# Patient Record
Sex: Female | Born: 1955 | Race: Black or African American | Hispanic: No | Marital: Single | Smoking: Current every day smoker
Health system: Southern US, Community
[De-identification: ages and names within clinical notes are randomized; demographics above are authoritative.]

## PROBLEM LIST (undated history)

## (undated) DIAGNOSIS — Z955 Presence of coronary angioplasty implant and graft: Secondary | ICD-10-CM

## (undated) DIAGNOSIS — G8929 Other chronic pain: Secondary | ICD-10-CM

## (undated) DIAGNOSIS — M79603 Pain in arm, unspecified: Secondary | ICD-10-CM

## (undated) DIAGNOSIS — E079 Disorder of thyroid, unspecified: Secondary | ICD-10-CM

## (undated) DIAGNOSIS — I10 Essential (primary) hypertension: Secondary | ICD-10-CM

## (undated) DIAGNOSIS — I219 Acute myocardial infarction, unspecified: Secondary | ICD-10-CM

## (undated) DIAGNOSIS — L8990 Pressure ulcer of unspecified site, unspecified stage: Secondary | ICD-10-CM

## (undated) DIAGNOSIS — G894 Chronic pain syndrome: Secondary | ICD-10-CM

## (undated) DIAGNOSIS — I639 Cerebral infarction, unspecified: Secondary | ICD-10-CM

## (undated) HISTORY — PX: LIPOMA EXCISION: SHX5283

## (undated) HISTORY — PX: BLADDER SUSPENSION: SHX72

---

## 2010-01-29 ENCOUNTER — Ambulatory Visit: Payer: Self-pay | Admitting: Diagnostic Radiology

## 2010-01-29 ENCOUNTER — Emergency Department (HOSPITAL_BASED_OUTPATIENT_CLINIC_OR_DEPARTMENT_OTHER): Admission: EM | Admit: 2010-01-29 | Discharge: 2010-01-29 | Payer: Self-pay | Admitting: Emergency Medicine

## 2010-01-30 ENCOUNTER — Emergency Department (HOSPITAL_BASED_OUTPATIENT_CLINIC_OR_DEPARTMENT_OTHER): Admission: EM | Admit: 2010-01-30 | Discharge: 2010-01-30 | Payer: Self-pay | Admitting: Emergency Medicine

## 2010-06-13 ENCOUNTER — Emergency Department (HOSPITAL_BASED_OUTPATIENT_CLINIC_OR_DEPARTMENT_OTHER)
Admission: EM | Admit: 2010-06-13 | Discharge: 2010-06-14 | Disposition: A | Discharge status: Home / Self Care | Payer: Self-pay | Attending: Emergency Medicine | Admitting: Emergency Medicine

## 2010-06-13 DIAGNOSIS — Z79899 Other long term (current) drug therapy: Secondary | ICD-10-CM | POA: Insufficient documentation

## 2010-06-13 DIAGNOSIS — R197 Diarrhea, unspecified: Secondary | ICD-10-CM | POA: Insufficient documentation

## 2010-06-13 DIAGNOSIS — I2510 Atherosclerotic heart disease of native coronary artery without angina pectoris: Secondary | ICD-10-CM | POA: Insufficient documentation

## 2010-06-13 DIAGNOSIS — E1169 Type 2 diabetes mellitus with other specified complication: Secondary | ICD-10-CM | POA: Insufficient documentation

## 2010-06-13 DIAGNOSIS — R112 Nausea with vomiting, unspecified: Secondary | ICD-10-CM | POA: Insufficient documentation

## 2010-06-13 DIAGNOSIS — I10 Essential (primary) hypertension: Secondary | ICD-10-CM | POA: Insufficient documentation

## 2010-06-13 LAB — URINALYSIS, ROUTINE W REFLEX MICROSCOPIC
Bilirubin Urine: NEGATIVE
Hgb urine dipstick: NEGATIVE
Ketones, ur: NEGATIVE mg/dL
Urine Glucose, Fasting: >1000 mg/dL — AB
pH: 6.5 (ref 5.0–8.0)

## 2010-06-13 LAB — BASIC METABOLIC PANEL
BUN: 8 mg/dL (ref 6–23)
Chloride: 107 mEq/L (ref 96–112)
Creatinine, Ser: 0.5 mg/dL (ref 0.4–1.2)
Glucose, Bld: 242 mg/dL — ABNORMAL HIGH (ref 70–99)
Potassium: 3.9 mEq/L (ref 3.5–5.1)

## 2010-06-13 LAB — URINE MICROSCOPIC-ADD ON

## 2010-06-29 LAB — URINALYSIS, ROUTINE W REFLEX MICROSCOPIC
Bilirubin Urine: NEGATIVE
Leukocytes, UA: NEGATIVE
Nitrite: NEGATIVE
Specific Gravity, Urine: 1.027 (ref 1.005–1.030)
Urobilinogen, UA: 1 mg/dL (ref 0.0–1.0)

## 2010-06-29 LAB — URINE MICROSCOPIC-ADD ON

## 2010-06-29 LAB — GLUCOSE, CAPILLARY: Glucose-Capillary: 181 mg/dL — ABNORMAL HIGH (ref 70–99)

## 2010-12-14 ENCOUNTER — Emergency Department (HOSPITAL_BASED_OUTPATIENT_CLINIC_OR_DEPARTMENT_OTHER)
Admission: EM | Admit: 2010-12-14 | Discharge: 2010-12-14 | Disposition: A | Discharge status: Home / Self Care | Payer: Self-pay | Attending: Emergency Medicine | Admitting: Emergency Medicine

## 2010-12-14 ENCOUNTER — Encounter: Payer: Self-pay | Admitting: *Deleted

## 2010-12-14 ENCOUNTER — Emergency Department (INDEPENDENT_AMBULATORY_CARE_PROVIDER_SITE_OTHER): Payer: Self-pay

## 2010-12-14 DIAGNOSIS — M79609 Pain in unspecified limb: Secondary | ICD-10-CM

## 2010-12-14 DIAGNOSIS — E119 Type 2 diabetes mellitus without complications: Secondary | ICD-10-CM

## 2010-12-14 DIAGNOSIS — J029 Acute pharyngitis, unspecified: Secondary | ICD-10-CM

## 2010-12-14 DIAGNOSIS — F17200 Nicotine dependence, unspecified, uncomplicated: Secondary | ICD-10-CM | POA: Insufficient documentation

## 2010-12-14 DIAGNOSIS — K029 Dental caries, unspecified: Secondary | ICD-10-CM | POA: Insufficient documentation

## 2010-12-14 DIAGNOSIS — I10 Essential (primary) hypertension: Secondary | ICD-10-CM | POA: Insufficient documentation

## 2010-12-14 DIAGNOSIS — Z794 Long term (current) use of insulin: Secondary | ICD-10-CM

## 2010-12-14 DIAGNOSIS — E049 Nontoxic goiter, unspecified: Secondary | ICD-10-CM | POA: Insufficient documentation

## 2010-12-14 DIAGNOSIS — E010 Iodine-deficiency related diffuse (endemic) goiter: Secondary | ICD-10-CM

## 2010-12-14 HISTORY — DX: Disorder of thyroid, unspecified: E07.9

## 2010-12-14 HISTORY — DX: Presence of coronary angioplasty implant and graft: Z95.5

## 2010-12-14 HISTORY — DX: Essential (primary) hypertension: I10

## 2010-12-14 MED ORDER — AZITHROMYCIN 250 MG PO TABS
500.0000 mg | ORAL_TABLET | Freq: Once | ORAL | Status: AC
  Administered 2010-12-14: 500 mg via ORAL
  Filled 2010-12-14: qty 2

## 2010-12-14 MED ORDER — PREDNISONE 20 MG PO TABS: 20.0000 mg | ORAL_TABLET | Freq: Every day | ORAL | Status: AC

## 2010-12-14 MED ORDER — PREDNISONE 20 MG PO TABS
20.0000 mg | ORAL_TABLET | Freq: Once | ORAL | Status: AC
  Administered 2010-12-14: 20 mg via ORAL
  Filled 2010-12-14: qty 1

## 2010-12-14 MED ORDER — AZITHROMYCIN 250 MG PO TABS: ORAL_TABLET | ORAL | Status: AC

## 2010-12-14 NOTE — ED Notes (Signed)
Onset Sunday night worsening sore throat, fever chills generalized body aches states having to sit up to sleep because she feels like her throat is closing when she lies down. Denies nausea vomiting

## 2010-12-14 NOTE — Discharge Instructions (Signed)
 Be sure to keep your appointment with your specialist next week.  You may wish to make another appointment with your regular doctor as well for later this week to recheck your arm.  Your ultrasound does not show any abscess or infection in your arm.  Take medications as prescribed.

## 2010-12-14 NOTE — ED Provider Notes (Signed)
History     CSN: [id number] Arrival date & time: 12/14/2010  8:40 AM  Chief Complaint  Patient presents with  . Sore Throat  . Adenopathy  . Generalized Body Aches  . Chills   HPI Comments: Patient reports that she's had some cold symptoms including a runny nose and a mild dry cough. She denies any dental pain. She asked of her teeth appear to be infected. She reports that she developed a "knot" located in her right triceps area associated with some weakness of her right hand. This occurred several months ago. Patient reports that she has been injecting her insulin into her upper arm in that area after her physician told her to change locations as far as where she administers her insulin. Chills reports that the last few nights has been difficult with breathing when she is laying flat and her head is turned from one side to the other. She reports no problems when she is sitting up and awake. She has a history of thyroid enlargement and used to be on Synthroid but was taken off of it 6 months ago. She reports her physician is aware that her thyroid was enlarged and chest appointment with a specialist next week on Thursday. She denies any chest pain, shortness of breath, abdominal pain, nausea, vomiting, diarrhea. She denies any fevers or chills that she is aware of. She does have a normal appetite and no significant weight loss and she is aware of. She denies any slurred speech, facial droop, numbness or weakness of her right leg or any of her limbs on the left side. She reports the area under her right tricep area is not swollen or painful. She reports she had a mammogram about 2 months ago and was told it was normal. She asks if she has Chad Nile disease.  Patient is a 55 y.o. female presenting with pharyngitis.  Sore Throat    Past Medical History  Diagnosis Date  . Hypertension   . Diabetes mellitus   . History of right coronary artery stent placement     2 stents placed at separate times    . Thyroid disease     History reviewed. No pertinent past surgical history.  History reviewed. No pertinent family history.  History  Substance Use Topics  . Smoking status: Current Everyday Smoker -- 0.5 packs/day  . Smokeless tobacco: Never Used  . Alcohol Use: No    OB History    Grav Para Term Preterm Abortions TAB SAB Ect Mult Living                  Review of Systems  All other systems reviewed and are negative.    Physical Exam  BP 132/66  Pulse 90  Temp(Src) 98.6 F (37 C) (Oral)  Resp 20  SpO2 99%  Physical Exam  Constitutional: She is oriented to person, place, and time. She appears well-developed and well-nourished.  HENT:  Head: Normocephalic.    Mouth/Throat: Oropharynx is clear and moist and mucous membranes are normal. Abnormal dentition. Dental caries present. No oropharyngeal exudate, posterior oropharyngeal edema, posterior oropharyngeal erythema or tonsillar abscesses.  Cardiovascular: Normal rate, regular rhythm and normal heart sounds.   Pulmonary/Chest: Effort normal and breath sounds normal. She has no wheezes.  Abdominal: Soft.  Musculoskeletal:       Arms: Neurological: She is alert and oriented to person, place, and time. She has normal strength.  Skin: Skin is warm and dry.  Psychiatric: Her mood appears  anxious.    ED Course  Procedures  MDM No abscess see on U/S.  No airway compromise on exam, no stridor, wheezing, no distress, RA sats are normal here on exam.  Pt is reassured. She has outpt follow up already arranged.  Will put her on prednisone low dose for swelling from pharyngitis and also z pak.        Gavin Pound. Ghim, MD 12/14/10 1043

## 2011-03-04 ENCOUNTER — Encounter (HOSPITAL_BASED_OUTPATIENT_CLINIC_OR_DEPARTMENT_OTHER): Payer: Self-pay | Admitting: *Deleted

## 2011-03-04 ENCOUNTER — Emergency Department (HOSPITAL_BASED_OUTPATIENT_CLINIC_OR_DEPARTMENT_OTHER)
Admission: EM | Admit: 2011-03-04 | Discharge: 2011-03-04 | Disposition: A | Discharge status: Home / Self Care | Payer: Self-pay | Attending: Emergency Medicine | Admitting: Emergency Medicine

## 2011-03-04 DIAGNOSIS — Z79899 Other long term (current) drug therapy: Secondary | ICD-10-CM | POA: Insufficient documentation

## 2011-03-04 DIAGNOSIS — I10 Essential (primary) hypertension: Secondary | ICD-10-CM | POA: Insufficient documentation

## 2011-03-04 DIAGNOSIS — F17200 Nicotine dependence, unspecified, uncomplicated: Secondary | ICD-10-CM | POA: Insufficient documentation

## 2011-03-04 DIAGNOSIS — E1169 Type 2 diabetes mellitus with other specified complication: Secondary | ICD-10-CM | POA: Insufficient documentation

## 2011-03-04 DIAGNOSIS — R739 Hyperglycemia, unspecified: Secondary | ICD-10-CM

## 2011-03-04 DIAGNOSIS — B3731 Acute candidiasis of vulva and vagina: Secondary | ICD-10-CM | POA: Insufficient documentation

## 2011-03-04 DIAGNOSIS — E079 Disorder of thyroid, unspecified: Secondary | ICD-10-CM | POA: Insufficient documentation

## 2011-03-04 DIAGNOSIS — B373 Candidiasis of vulva and vagina: Secondary | ICD-10-CM | POA: Insufficient documentation

## 2011-03-04 LAB — GLUCOSE, CAPILLARY
Glucose-Capillary: 128 mg/dL — ABNORMAL HIGH (ref 70–99)
Glucose-Capillary: 170 mg/dL — ABNORMAL HIGH (ref 70–99)

## 2011-03-04 LAB — URINALYSIS, ROUTINE W REFLEX MICROSCOPIC
Ketones, ur: NEGATIVE mg/dL
Leukocytes, UA: NEGATIVE
Nitrite: NEGATIVE
Protein, ur: NEGATIVE mg/dL
pH: 5.5 (ref 5.0–8.0)

## 2011-03-04 LAB — BASIC METABOLIC PANEL
Calcium: 9.6 mg/dL (ref 8.4–10.5)
Chloride: 102 mEq/L (ref 96–112)
Creatinine, Ser: 0.4 mg/dL — ABNORMAL LOW (ref 0.50–1.10)
GFR calc Af Amer: >90 mL/min (ref >90)

## 2011-03-04 MED ORDER — INSULIN REGULAR HUMAN 100 UNIT/ML IJ SOLN
INTRAMUSCULAR | Status: AC
  Filled 2011-03-04: qty 3

## 2011-03-04 MED ORDER — INSULIN ASPART 100 UNIT/ML ~~LOC~~ SOLN
10.0000 [IU] | Freq: Once | SUBCUTANEOUS | Status: AC
  Administered 2011-03-04: 10 [IU] via INTRAVENOUS
  Filled 2011-03-04: qty 3

## 2011-03-04 MED ORDER — CLOTRIMAZOLE 1 % EX CREA: TOPICAL_CREAM | CUTANEOUS | Status: DC

## 2011-03-04 MED ORDER — FLUCONAZOLE 200 MG PO TABS: 200.0000 mg | ORAL_TABLET | Freq: Every day | ORAL | Status: AC

## 2011-03-04 MED ORDER — SODIUM CHLORIDE 0.9 % IV BOLUS (SEPSIS)
1000.0000 mL | Freq: Once | INTRAVENOUS | Status: AC
  Administered 2011-03-04: 1000 mL via INTRAVENOUS

## 2011-03-04 NOTE — ED Notes (Signed)
Pt states she has a hx of yeast infections and has been "raw down there" for about a week. "Causing my sugar to go up"

## 2011-03-04 NOTE — ED Provider Notes (Signed)
History     CSN: [id number] Arrival date & time: 03/04/2011  2:23 PM   First MD Initiated Contact with Patient 03/04/11 1506      Chief Complaint  Patient presents with  . Vaginitis    (Consider location/radiation/quality/duration/timing/severity/associated sxs/prior treatment) HPI  Past Medical History  Diagnosis Date  . Hypertension   . Diabetes mellitus   . History of right coronary artery stent placement     2 stents placed at separate times  . Thyroid disease     Past Surgical History  Procedure Date  . Abdominal hysterectomy     History reviewed. No pertinent family history.  History  Substance Use Topics  . Smoking status: Current Everyday Smoker -- 0.5 packs/day  . Smokeless tobacco: Never Used  . Alcohol Use: No    OB History    Grav Para Term Preterm Abortions TAB SAB Ect Mult Living                  Review of Systems  Allergies  Amoxicillin; Morphine and related; and Percodan  Home Medications   Current Outpatient Rx  Name Route Sig Dispense Refill  . LEVOTHYROXINE SODIUM 125 MCG PO TABS Oral Take 125 mcg by mouth daily.      Marland Kitchen METOPROLOL SUCCINATE 25 MG PO TB24 Oral Take 25 mg by mouth daily.      . ASPIRIN 81 MG PO CHEW Oral Chew 81 mg by mouth daily.      . ATORVASTATIN CALCIUM 10 MG PO TABS Oral Take 10 mg by mouth daily.      Marland Kitchen CLOTRIMAZOLE 1 % EX CREA  Apply to affected area 2 times daily 15 g 0  . FLUCONAZOLE 200 MG PO TABS Oral Take 1 tablet (200 mg total) by mouth daily. 7 tablet 0  . GLIPIZIDE 10 MG PO TABS Oral Take 10 mg by mouth 2 (two) times daily before a meal.      . INSULIN GLARGINE 100 UNIT/ML [place] SOLN Subcutaneous Inject into the skin at bedtime.        BP 140/53  Pulse 88  Temp(Src) 98.4 F (36.9 C) (Oral)  Resp 20  Ht 4\' 11"  (1.499 m)  Wt 135 lb (61.236 kg)  BMI 27.27 kg/m2  SpO2 100%  Physical Exam  ED Course  Procedures (including critical care time)  Labs Reviewed  GLUCOSE, CAPILLARY - Abnormal; Notable  for the following:    Glucose-Capillary 494 (*)    All other components within normal limits  BASIC METABOLIC PANEL - Abnormal; Notable for the following:    Glucose, Bld 385 (*)    Creatinine, Ser 0.40 (*)    All other components within normal limits  URINALYSIS, ROUTINE W REFLEX MICROSCOPIC - Abnormal; Notable for the following:    Specific Gravity, Urine 1.043 (*)    Glucose, UA >1000 (*)    All other components within normal limits  URINE MICROSCOPIC-ADD ON - Abnormal; Notable for the following:    Bacteria, UA FEW (*)    All other components within normal limits  GLUCOSE, CAPILLARY - Abnormal; Notable for the following:    Glucose-Capillary 194 (*)    All other components within normal limits  GLUCOSE, CAPILLARY - Abnormal; Notable for the following:    Glucose-Capillary 128 (*)    All other components within normal limits  GLUCOSE, CAPILLARY - Abnormal; Notable for the following:    Glucose-Capillary 170 (*)    All other components within normal limits  No results found.   1. Hyperglycemia   2. Vaginal yeast infection       MDM  Patient feeling better, CBG much improved. Patient has eaten and will follow up with her doctor.   Medical screening examination/treatment/procedure(s) were performed by non-physician practitioner and as supervising physician I was immediately available for consultation/collaboration. Osvaldo Human, M.D.     Rodena Medin, PA 03/04/11 2013  Carleene Cooper III, MD 03/05/11 1225

## 2011-11-13 ENCOUNTER — Emergency Department (HOSPITAL_BASED_OUTPATIENT_CLINIC_OR_DEPARTMENT_OTHER): Payer: Self-pay

## 2011-11-13 ENCOUNTER — Encounter (HOSPITAL_BASED_OUTPATIENT_CLINIC_OR_DEPARTMENT_OTHER): Payer: Self-pay | Admitting: Emergency Medicine

## 2011-11-13 ENCOUNTER — Emergency Department (HOSPITAL_BASED_OUTPATIENT_CLINIC_OR_DEPARTMENT_OTHER)
Admission: EM | Admit: 2011-11-13 | Discharge: 2011-11-13 | Disposition: A | Discharge status: Home / Self Care | Payer: Self-pay | Attending: Emergency Medicine | Admitting: Emergency Medicine

## 2011-11-13 DIAGNOSIS — E079 Disorder of thyroid, unspecified: Secondary | ICD-10-CM | POA: Insufficient documentation

## 2011-11-13 DIAGNOSIS — M549 Dorsalgia, unspecified: Secondary | ICD-10-CM | POA: Insufficient documentation

## 2011-11-13 DIAGNOSIS — Z9861 Coronary angioplasty status: Secondary | ICD-10-CM | POA: Insufficient documentation

## 2011-11-13 DIAGNOSIS — F17200 Nicotine dependence, unspecified, uncomplicated: Secondary | ICD-10-CM | POA: Insufficient documentation

## 2011-11-13 DIAGNOSIS — I10 Essential (primary) hypertension: Secondary | ICD-10-CM | POA: Insufficient documentation

## 2011-11-13 DIAGNOSIS — Z794 Long term (current) use of insulin: Secondary | ICD-10-CM | POA: Insufficient documentation

## 2011-11-13 DIAGNOSIS — Z79899 Other long term (current) drug therapy: Secondary | ICD-10-CM | POA: Insufficient documentation

## 2011-11-13 DIAGNOSIS — E119 Type 2 diabetes mellitus without complications: Secondary | ICD-10-CM | POA: Insufficient documentation

## 2011-11-13 LAB — URINALYSIS, ROUTINE W REFLEX MICROSCOPIC
Bilirubin Urine: NEGATIVE
Glucose, UA: >1000 mg/dL — AB
Hgb urine dipstick: NEGATIVE
Specific Gravity, Urine: 1.013 (ref 1.005–1.030)
Urobilinogen, UA: 0.2 mg/dL (ref 0.0–1.0)

## 2011-11-13 LAB — URINE MICROSCOPIC-ADD ON

## 2011-11-13 MED ORDER — DOCUSATE SODIUM 100 MG PO CAPS: 100.0000 mg | ORAL_CAPSULE | Freq: Two times a day (BID) | ORAL | Status: AC

## 2011-11-13 MED ORDER — TRAMADOL HCL 50 MG PO TABS: 50.0000 mg | ORAL_TABLET | Freq: Four times a day (QID) | ORAL | Status: AC | PRN

## 2011-11-13 MED ORDER — TRAMADOL HCL 50 MG PO TABS
50.0000 mg | ORAL_TABLET | Freq: Once | ORAL | Status: AC
  Administered 2011-11-13: 50 mg via ORAL

## 2011-11-13 MED ORDER — TRAMADOL HCL 50 MG PO TABS
ORAL_TABLET | ORAL | Status: AC
  Filled 2011-11-13: qty 1

## 2011-11-13 MED ORDER — METRONIDAZOLE 1 % EX GEL: Freq: Every day | CUTANEOUS | Status: AC

## 2011-11-13 NOTE — ED Provider Notes (Signed)
History     CSN: [id number]  Arrival date & time 11/13/11  1478   First MD Initiated Contact with Patient 11/13/11 2045      Chief Complaint  Patient presents with  . Back Pain    (Consider location/radiation/quality/duration/timing/severity/associated sxs/prior treatment) Patient is a 56 y.o. female presenting with back pain. The history is provided by the patient. No language interpreter was used.  Back Pain  This is a new problem. The problem occurs constantly. The problem has not changed since onset.The pain is associated with no known injury. The pain is present in the thoracic spine. The quality of the pain is described as aching. The pain is moderate. The symptoms are aggravated by bending. Pertinent negatives include no chest pain and no abdominal pain. She has tried nothing for the symptoms. The treatment provided no relief.    Past Medical History  Diagnosis Date  . Hypertension   . Diabetes mellitus   . History of right coronary artery stent placement     2 stents placed at separate times  . Thyroid disease     Past Surgical History  Procedure Date  . Abdominal hysterectomy     No family history on file.  History  Substance Use Topics  . Smoking status: Current Everyday Smoker -- 0.5 packs/day  . Smokeless tobacco: Never Used  . Alcohol Use: No    OB History    Grav Para Term Preterm Abortions TAB SAB Ect Mult Living                  Review of Systems  Cardiovascular: Negative for chest pain.  Gastrointestinal: Negative for abdominal pain.  Musculoskeletal: Positive for back pain.  All other systems reviewed and are negative.    Allergies  Amoxicillin; Morphine and related; and Percodan  Home Medications   Current Outpatient Rx  Name Route Sig Dispense Refill  . ASPIRIN 81 MG PO CHEW Oral Chew 81 mg by mouth daily.      . ATORVASTATIN CALCIUM 10 MG PO TABS Oral Take 10 mg by mouth daily.      Marland Kitchen CLOTRIMAZOLE 1 % EX CREA  Apply to affected  area 2 times daily 15 g 0  . GLIPIZIDE 10 MG PO TABS Oral Take 10 mg by mouth 2 (two) times daily before a meal.      . INSULIN GLARGINE 100 UNIT/ML [place] SOLN Subcutaneous Inject into the skin at bedtime.      Marland Kitchen LEVOTHYROXINE SODIUM 125 MCG PO TABS Oral Take 125 mcg by mouth daily.      Marland Kitchen METOPROLOL SUCCINATE ER 25 MG PO TB24 Oral Take 25 mg by mouth daily.        BP 168/65  Pulse 64  Temp 99 F (37.2 C) (Oral)  Resp 16  Ht 4\' 11"  (1.499 m)  Wt 140 lb (63.504 kg)  BMI 28.28 kg/m2  SpO2 99%  Physical Exam  Nursing note and vitals reviewed. Constitutional: She is oriented to person, place, and time. She appears well-developed and well-nourished.  HENT:  Head: Normocephalic and atraumatic.  Right Ear: External ear normal.  Left Ear: External ear normal.  Eyes: Conjunctivae and EOM are normal. Pupils are equal, round, and reactive to light.  Neck: Normal range of motion. Neck supple.  Cardiovascular: Normal rate and regular rhythm.   Pulmonary/Chest: Effort normal and breath sounds normal.  Abdominal: Soft. Bowel sounds are normal.  Musculoskeletal: She exhibits tenderness.       Tender  thoracic spine diffusely  Neurological: She is alert and oriented to person, place, and time. She has normal reflexes.  Skin: Skin is warm.  Psychiatric: She has a normal mood and affect.    ED Course  Procedures (including critical care time)  Labs Reviewed  URINALYSIS, ROUTINE W REFLEX MICROSCOPIC - Abnormal; Notable for the following:    Glucose, UA >1000 (*)     All other components within normal limits  URINE MICROSCOPIC-ADD ON   No results found.   1. Back pain       MDM  Pt has a list of multiple diseases she is concerned about.  Pt is scheduled for a breast biopsy for a calcification seen on mammogram        Lonia Skinner Pierre Part, [place] 11/13/11 2321

## 2011-11-13 NOTE — ED Notes (Addendum)
Pt was seen 5 months ago for back pain at [facility] and sts was told she was constipated.  Pain continues from mid back to upper back. Pt has researched on the internet all the possible causes of her pain and she is concerned.

## 2011-11-13 NOTE — ED Notes (Signed)
Pt states was seen at [facility] and had a cat scan performed "and they told me i was constipated". Pt has been taking suppositories with minimal relief. States pain does not worsen with movement or certain positions. Pt has a list of various physical conditions that she is concerned she may have.

## 2011-11-14 NOTE — ED Provider Notes (Signed)
Medical screening examination/treatment/procedure(s) were performed by non-physician practitioner and as supervising physician I was immediately available for consultation/collaboration.   Wataru Mccowen, MD 11/14/11 0011 

## 2012-04-22 ENCOUNTER — Encounter (HOSPITAL_BASED_OUTPATIENT_CLINIC_OR_DEPARTMENT_OTHER): Payer: Self-pay | Admitting: *Deleted

## 2012-04-22 ENCOUNTER — Emergency Department (HOSPITAL_BASED_OUTPATIENT_CLINIC_OR_DEPARTMENT_OTHER): Payer: Self-pay

## 2012-04-22 ENCOUNTER — Emergency Department (HOSPITAL_BASED_OUTPATIENT_CLINIC_OR_DEPARTMENT_OTHER)
Admission: EM | Admit: 2012-04-22 | Discharge: 2012-04-22 | Disposition: A | Discharge status: Home / Self Care | Payer: Self-pay | Attending: Emergency Medicine | Admitting: Emergency Medicine

## 2012-04-22 DIAGNOSIS — R509 Fever, unspecified: Secondary | ICD-10-CM | POA: Insufficient documentation

## 2012-04-22 DIAGNOSIS — R071 Chest pain on breathing: Secondary | ICD-10-CM | POA: Insufficient documentation

## 2012-04-22 DIAGNOSIS — E079 Disorder of thyroid, unspecified: Secondary | ICD-10-CM | POA: Insufficient documentation

## 2012-04-22 DIAGNOSIS — R059 Cough, unspecified: Secondary | ICD-10-CM | POA: Insufficient documentation

## 2012-04-22 DIAGNOSIS — Z79899 Other long term (current) drug therapy: Secondary | ICD-10-CM | POA: Insufficient documentation

## 2012-04-22 DIAGNOSIS — R05 Cough: Secondary | ICD-10-CM | POA: Insufficient documentation

## 2012-04-22 DIAGNOSIS — E119 Type 2 diabetes mellitus without complications: Secondary | ICD-10-CM | POA: Insufficient documentation

## 2012-04-22 DIAGNOSIS — J3489 Other specified disorders of nose and nasal sinuses: Secondary | ICD-10-CM | POA: Insufficient documentation

## 2012-04-22 DIAGNOSIS — IMO0001 Reserved for inherently not codable concepts without codable children: Secondary | ICD-10-CM | POA: Insufficient documentation

## 2012-04-22 DIAGNOSIS — Z95818 Presence of other cardiac implants and grafts: Secondary | ICD-10-CM | POA: Insufficient documentation

## 2012-04-22 DIAGNOSIS — R5381 Other malaise: Secondary | ICD-10-CM | POA: Insufficient documentation

## 2012-04-22 DIAGNOSIS — R197 Diarrhea, unspecified: Secondary | ICD-10-CM | POA: Insufficient documentation

## 2012-04-22 DIAGNOSIS — R630 Anorexia: Secondary | ICD-10-CM | POA: Insufficient documentation

## 2012-04-22 DIAGNOSIS — Z794 Long term (current) use of insulin: Secondary | ICD-10-CM | POA: Insufficient documentation

## 2012-04-22 DIAGNOSIS — F17200 Nicotine dependence, unspecified, uncomplicated: Secondary | ICD-10-CM | POA: Insufficient documentation

## 2012-04-22 DIAGNOSIS — J111 Influenza due to unidentified influenza virus with other respiratory manifestations: Secondary | ICD-10-CM

## 2012-04-22 DIAGNOSIS — R51 Headache: Secondary | ICD-10-CM | POA: Insufficient documentation

## 2012-04-22 DIAGNOSIS — R5383 Other fatigue: Secondary | ICD-10-CM | POA: Insufficient documentation

## 2012-04-22 DIAGNOSIS — I10 Essential (primary) hypertension: Secondary | ICD-10-CM | POA: Insufficient documentation

## 2012-04-22 DIAGNOSIS — Z7982 Long term (current) use of aspirin: Secondary | ICD-10-CM | POA: Insufficient documentation

## 2012-04-22 MED ORDER — KETOROLAC TROMETHAMINE 30 MG/ML IJ SOLN
30.0000 mg | Freq: Once | INTRAMUSCULAR | Status: AC
  Administered 2012-04-22: 30 mg via INTRAMUSCULAR
  Filled 2012-04-22: qty 1

## 2012-04-22 MED ORDER — FLUTICASONE PROPIONATE 50 MCG/ACT NA SUSP: 2.0000 | Freq: Every day | NASAL | Status: DC

## 2012-04-22 MED ORDER — KETOROLAC TROMETHAMINE 10 MG PO TABS: 10.0000 mg | ORAL_TABLET | Freq: Four times a day (QID) | ORAL | Status: DC | PRN

## 2012-04-22 MED ORDER — ALBUTEROL SULFATE HFA 108 (90 BASE) MCG/ACT IN AERS: 2.0000 | INHALATION_SPRAY | RESPIRATORY_TRACT | Status: DC | PRN

## 2012-04-22 MED ORDER — AEROCHAMBER PLUS W/MASK MISC: Status: AC

## 2012-04-22 NOTE — ED Provider Notes (Signed)
History   This chart was scribed for Laura Horn, MD by Leone Payor, ED Scribe. This patient was seen in room MHOTF/OTF and the patient's care was started at 1938.    CSN: [id number]  Arrival date & time 04/22/12  1811   First MD Initiated Contact with Patient 04/22/12 1938      Chief Complaint  Patient presents with  . Cough     The history is provided by the patient. No language interpreter was used.    Laura Oconnor is a 57 y.o. female who presents to the Emergency Department complaining of cough with associated diarrhea, fever, chills, fatigue, weakness, congestion, sinus pressure, body aches, headaches starting 2 days ago. Pt has decreased appetite but denies forcing herself to consume food. She denies vomiting, SOB, rash. Pt does not remember having an inhaler before but states she has had a breathing treatment in the past.    Pt has h/o HTN, DM.  Pt is a current everyday smoker but denies alcohol use.  Past Medical History  Diagnosis Date  . Hypertension   . Diabetes mellitus   . History of right coronary artery stent placement     2 stents placed at separate times  . Thyroid disease     Past Surgical History  Procedure Date  . Abdominal hysterectomy     No family history on file.  History  Substance Use Topics  . Smoking status: Current Every Day Smoker -- 0.5 packs/day    Types: Cigarettes  . Smokeless tobacco: Never Used  . Alcohol Use: No    No OB history provided.   Review of Systems 10 Systems reviewed and all are negative for acute change except as noted in the HPI.    Allergies  Amoxicillin; Morphine and related; Percocet; and Percodan  Home Medications   Current Outpatient Rx  Name  Route  Sig  Dispense  Refill  . ALBUTEROL SULFATE HFA 108 (90 BASE) MCG/ACT IN AERS   Inhalation   Inhale 2 puffs into the lungs every 2 (two) hours as needed for wheezing or shortness of breath (cough).   1 Inhaler   0   . ASPIRIN 81 MG PO CHEW   Oral  Chew 81 mg by mouth daily.           . ATORVASTATIN CALCIUM 10 MG PO TABS   Oral   Take 10 mg by mouth daily.           Marland Kitchen FLUTICASONE PROPIONATE 50 MCG/ACT NA SUSP   Nasal   Place 2 sprays into the nose daily.   16 g   0   . GLIPIZIDE 10 MG PO TABS   Oral   Take 10 mg by mouth 2 (two) times daily before a meal.           . INSULIN GLARGINE 100 UNIT/ML [place] SOLN   Subcutaneous   Inject 60 Units into the skin at bedtime.          Marland Kitchen KETOROLAC TROMETHAMINE 10 MG PO TABS   Oral   Take 1 tablet (10 mg total) by mouth every 6 (six) hours as needed for pain.   20 tablet   0   . LEVOTHYROXINE SODIUM 125 MCG PO TABS   Oral   Take 125 mcg by mouth daily.           Marland Kitchen METOPROLOL SUCCINATE ER 25 MG PO TB24   Oral   Take 25 mg by mouth  daily. Patient took this medication yesterday between 12:30 pm- 1:00 pm.         . METRONIDAZOLE 1 % EX GEL   Topical   Apply topically daily.   45 g   0   . AEROCHAMBER PLUS W/MASK MISC      Use as instructed   1 each   2   . TRAMADOL HCL 50 MG PO TABS   Oral   Take 50 mg by mouth every 6 (six) hours as needed. For pain.           BP 161/58  Pulse 107  Temp 98.7 F (37.1 C) (Oral)  Resp 18  SpO2 97%  Physical Exam  Nursing note and vitals reviewed. Constitutional:       Awake, alert, nontoxic appearance.  HENT:  Head: Atraumatic.  Right Ear: External ear normal.  Left Ear: External ear normal.  Mouth/Throat: Oropharynx is clear and moist.  Eyes: Right eye exhibits no discharge. Left eye exhibits no discharge.  Neck: Neck supple.  Cardiovascular: Normal rate, regular rhythm and normal heart sounds.   Pulmonary/Chest: Effort normal and breath sounds normal. She has no wheezes.       Chest wall tender.   Abdominal: Soft. There is no tenderness. There is no rebound.  Musculoskeletal: She exhibits no tenderness.       Baseline ROM, no obvious new focal weakness. Back and all 4 extremities are diffusely tender.     Neurological:       Mental status and motor strength appears baseline for patient and situation.  Skin: No rash noted.  Psychiatric: She has a normal mood and affect.    ED Course  Procedures (including critical care time)  DIAGNOSTIC STUDIES: Oxygen Saturation is 97% on room air, adequate by my interpretation.    COORDINATION OF CARE:   7:49PM Patient / Family / Caregiver informed of clinical course, understand medical decision-making process, and agree with plan. Pt requests Toradol for pain. She states it is the only medication that works for her.   Labs Reviewed - No data to display Dg Chest 2 View  04/22/2012  *RADIOLOGY REPORT*  Clinical Data: Cough.  Chills and fever  CHEST - 2 VIEW  Comparison: 11/13/2011  Findings: Normal heart size.  No pleural effusion or edema.  No airspace consolidation identified.  The visualized osseous structures are unremarkable.  IMPRESSION:  1.  No active cardiopulmonary abnormalities.   Original Report Authenticated By: Signa Kell, M.D.      1. Influenza       MDM  I personally performed the services described in this documentation, which was scribed in my presence. The recorded information has been reviewed and is accurate.  Patient / Family / Caregiver informed of clinical course, understand medical decision-making process, and agree with plan.  I doubt any other EMC precluding discharge at this time including, but not necessarily limited to the following: SBI.  Laura Horn, MD 04/23/12 [phone]

## 2012-04-22 NOTE — ED Notes (Signed)
Pt. Reports her blood sugar today was 139 and she took her lantus today at 1720 60 units and she took her glucotrol this morning at 7am

## 2012-04-22 NOTE — ED Notes (Signed)
Cough, chills, fever, aching all over, headache x 2 days.

## 2012-06-08 ENCOUNTER — Emergency Department (HOSPITAL_BASED_OUTPATIENT_CLINIC_OR_DEPARTMENT_OTHER)
Admission: EM | Admit: 2012-06-08 | Discharge: 2012-06-08 | Disposition: A | Discharge status: Home / Self Care | Payer: Self-pay | Attending: Emergency Medicine | Admitting: Emergency Medicine

## 2012-06-08 ENCOUNTER — Encounter (HOSPITAL_BASED_OUTPATIENT_CLINIC_OR_DEPARTMENT_OTHER): Payer: Self-pay | Admitting: *Deleted

## 2012-06-08 DIAGNOSIS — E119 Type 2 diabetes mellitus without complications: Secondary | ICD-10-CM | POA: Insufficient documentation

## 2012-06-08 DIAGNOSIS — E079 Disorder of thyroid, unspecified: Secondary | ICD-10-CM | POA: Insufficient documentation

## 2012-06-08 DIAGNOSIS — IMO0001 Reserved for inherently not codable concepts without codable children: Secondary | ICD-10-CM | POA: Insufficient documentation

## 2012-06-08 DIAGNOSIS — I10 Essential (primary) hypertension: Secondary | ICD-10-CM | POA: Insufficient documentation

## 2012-06-08 DIAGNOSIS — Z794 Long term (current) use of insulin: Secondary | ICD-10-CM | POA: Insufficient documentation

## 2012-06-08 DIAGNOSIS — M2550 Pain in unspecified joint: Secondary | ICD-10-CM | POA: Insufficient documentation

## 2012-06-08 DIAGNOSIS — K5900 Constipation, unspecified: Secondary | ICD-10-CM | POA: Insufficient documentation

## 2012-06-08 DIAGNOSIS — E1142 Type 2 diabetes mellitus with diabetic polyneuropathy: Secondary | ICD-10-CM | POA: Insufficient documentation

## 2012-06-08 DIAGNOSIS — IMO0002 Reserved for concepts with insufficient information to code with codable children: Secondary | ICD-10-CM | POA: Insufficient documentation

## 2012-06-08 DIAGNOSIS — F17200 Nicotine dependence, unspecified, uncomplicated: Secondary | ICD-10-CM | POA: Insufficient documentation

## 2012-06-08 DIAGNOSIS — Z9861 Coronary angioplasty status: Secondary | ICD-10-CM | POA: Insufficient documentation

## 2012-06-08 DIAGNOSIS — Z7982 Long term (current) use of aspirin: Secondary | ICD-10-CM | POA: Insufficient documentation

## 2012-06-08 DIAGNOSIS — E1149 Type 2 diabetes mellitus with other diabetic neurological complication: Secondary | ICD-10-CM | POA: Insufficient documentation

## 2012-06-08 DIAGNOSIS — Z79899 Other long term (current) drug therapy: Secondary | ICD-10-CM | POA: Insufficient documentation

## 2012-06-08 LAB — GLUCOSE, CAPILLARY

## 2012-06-08 MED ORDER — MAGNESIUM CITRATE PO SOLN
1.0000 | Freq: Once | ORAL | Status: AC
  Administered 2012-06-08: 1 via ORAL
  Filled 2012-06-08: qty 296

## 2012-06-08 MED ORDER — PREGABALIN 100 MG PO CAPS
200.0000 mg | ORAL_CAPSULE | Freq: Once | ORAL | Status: DC
  Filled 2012-06-08: qty 2

## 2012-06-08 MED ORDER — GABAPENTIN 600 MG PO TABS
ORAL_TABLET | ORAL | Status: AC
  Administered 2012-06-08: 600 mg via ORAL
  Filled 2012-06-08: qty 1

## 2012-06-08 MED ORDER — KETOROLAC TROMETHAMINE 60 MG/2ML IM SOLN
60.0000 mg | Freq: Once | INTRAMUSCULAR | Status: AC
  Administered 2012-06-08: 60 mg via INTRAMUSCULAR
  Filled 2012-06-08: qty 2

## 2012-06-08 MED ORDER — PREGABALIN 300 MG PO CAPS: 300.0000 mg | ORAL_CAPSULE | Freq: Every day | ORAL | Status: DC

## 2012-06-08 MED ORDER — GABAPENTIN 600 MG PO TABS
600.0000 mg | ORAL_TABLET | Freq: Once | ORAL | Status: AC
  Administered 2012-06-08: 600 mg via ORAL

## 2012-06-08 NOTE — ED Notes (Signed)
Left leg pain onset Tuesday. No known injury. Unable to sleep.

## 2012-06-08 NOTE — ED Provider Notes (Signed)
History     CSN: [id number]  Arrival date & time 06/08/12  1322   First MD Initiated Contact with Patient 06/08/12 1330      Chief Complaint  Patient presents with  . Leg Pain    (Consider location/radiation/quality/duration/timing/severity/associated sxs/prior treatment) HPI Comments: Patient is a 57 year old female with a past medical history of hypertension and diabetes who presents with left leg pain for the past 5 days. Pain started gradually and progressively worsened since the onset. The pain is burning and severe. Patient has not tried anything for symptoms. No aggravating/alleviating factors. Patient also complains of constipation. She has tried an enema which has provided no relief.    Past Medical History  Diagnosis Date  . Hypertension   . Diabetes mellitus   . History of right coronary artery stent placement     2 stents placed at separate times  . Thyroid disease     Past Surgical History  Procedure Laterality Date  . Abdominal hysterectomy      History reviewed. No pertinent family history.  History  Substance Use Topics  . Smoking status: Current Every Day Smoker -- 0.50 packs/day    Types: Cigarettes  . Smokeless tobacco: Never Used  . Alcohol Use: No    OB History   Grav Para Term Preterm Abortions TAB SAB Ect Mult Living                  Review of Systems  Gastrointestinal: Positive for constipation.  Musculoskeletal: Positive for myalgias and arthralgias.  All other systems reviewed and are negative.    Allergies  Amoxicillin; Morphine and related; Percocet; and Percodan  Home Medications   Current Outpatient Rx  Name  Route  Sig  Dispense  Refill  . albuterol (PROVENTIL HFA;VENTOLIN HFA) 108 (90 BASE) MCG/ACT inhaler   Inhalation   Inhale 2 puffs into the lungs every 2 (two) hours as needed for wheezing or shortness of breath (cough).   1 Inhaler   0   . aspirin 81 MG chewable tablet   Oral   Chew 81 mg by mouth daily.           Marland Kitchen atorvastatin (LIPITOR) 10 MG tablet   Oral   Take 10 mg by mouth daily.           . fluticasone (FLONASE) 50 MCG/ACT nasal spray   Nasal   Place 2 sprays into the nose daily.   16 g   0   . glipiZIDE (GLUCOTROL) 10 MG tablet   Oral   Take 10 mg by mouth 2 (two) times daily before a meal.           . insulin glargine (LANTUS) 100 UNIT/ML injection   Subcutaneous   Inject 60 Units into the skin at bedtime.          Marland Kitchen ketorolac (TORADOL) 10 MG tablet   Oral   Take 1 tablet (10 mg total) by mouth every 6 (six) hours as needed for pain.   20 tablet   0   . levothyroxine (SYNTHROID, LEVOTHROID) 125 MCG tablet   Oral   Take 125 mcg by mouth daily.           . metoprolol succinate (TOPROL-XL) 25 MG 24 hr tablet   Oral   Take 25 mg by mouth daily. Patient took this medication yesterday between 12:30 pm- 1:00 pm.         . metroNIDAZOLE (METROGEL) 1 % gel  Topical   Apply topically daily.   45 g   0   . Spacer/Aero-Holding Chambers (AEROCHAMBER PLUS WITH MASK) inhaler      Use as instructed   1 each   2   . traMADol (ULTRAM) 50 MG tablet   Oral   Take 50 mg by mouth every 6 (six) hours as needed. For pain.           BP 152/68  Pulse 98  Temp(Src) 98.8 F (37.1 C) (Oral)  Resp 18  Ht 4\' 11"  (1.499 m)  Wt 140 lb (63.504 kg)  BMI 28.26 kg/m2  SpO2 100%  Physical Exam  Nursing note and vitals reviewed. Constitutional: She is oriented to person, place, and time. She appears well-developed and well-nourished. No distress.  HENT:  Head: Normocephalic and atraumatic.  Eyes: Conjunctivae and EOM are normal.  Neck: Normal range of motion.  Cardiovascular: Normal rate and regular rhythm.  Exam reveals no gallop and no friction rub.   No murmur heard. Pulmonary/Chest: Effort normal and breath sounds normal. She has no wheezes. She has no rales. She exhibits no tenderness.  Abdominal: Soft. She exhibits no distension. There is no tenderness. There  is no rebound and no guarding.  Musculoskeletal: Normal range of motion.  Anterior left thigh tenderness to palpation. No obvious deformity. Normal ROM.   Neurological: She is alert and oriented to person, place, and time. Coordination normal.  Strength and sensation equal and intact bilaterally. Speech is goal-oriented. Moves limbs without ataxia.   Skin: Skin is warm and dry.  Psychiatric: She has a normal mood and affect. Her behavior is normal.    ED Course  Procedures (including critical care time)  Labs Reviewed - No data to display No results found.   1. Diabetic neuropathy       MDM  1:54 PM Patient likely has diabetic neuropathy. Patient will have toradol here and lyrica. Patient also requests something for constipation so I will give her magnesium citrate.   3:15 PM Patient reports pain relief with toradol. Patient will be discharged with lyrica prescription and recommended follow up with her PCP. Vitals stable for discharge. Patient instructed to return to the ED with worsening or concerning symptoms.        Emilia Beck, PA-C 06/08/12 1520

## 2012-06-08 NOTE — ED Provider Notes (Signed)
Medical screening examination/treatment/procedure(s) were performed by non-physician practitioner and as supervising physician I was immediately available for consultation/collaboration.  Amirrah Quigley, MD 06/08/12 1857 

## 2012-12-31 ENCOUNTER — Encounter (HOSPITAL_BASED_OUTPATIENT_CLINIC_OR_DEPARTMENT_OTHER): Payer: Self-pay | Admitting: *Deleted

## 2012-12-31 ENCOUNTER — Emergency Department (HOSPITAL_BASED_OUTPATIENT_CLINIC_OR_DEPARTMENT_OTHER)
Admission: EM | Admit: 2012-12-31 | Discharge: 2012-12-31 | Disposition: A | Discharge status: Home / Self Care | Payer: Self-pay | Attending: Emergency Medicine | Admitting: Emergency Medicine

## 2012-12-31 DIAGNOSIS — F17200 Nicotine dependence, unspecified, uncomplicated: Secondary | ICD-10-CM | POA: Insufficient documentation

## 2012-12-31 DIAGNOSIS — Z9861 Coronary angioplasty status: Secondary | ICD-10-CM | POA: Insufficient documentation

## 2012-12-31 DIAGNOSIS — Z79899 Other long term (current) drug therapy: Secondary | ICD-10-CM | POA: Insufficient documentation

## 2012-12-31 DIAGNOSIS — M25551 Pain in right hip: Secondary | ICD-10-CM

## 2012-12-31 DIAGNOSIS — Z7982 Long term (current) use of aspirin: Secondary | ICD-10-CM | POA: Insufficient documentation

## 2012-12-31 DIAGNOSIS — M25559 Pain in unspecified hip: Secondary | ICD-10-CM | POA: Insufficient documentation

## 2012-12-31 DIAGNOSIS — I10 Essential (primary) hypertension: Secondary | ICD-10-CM | POA: Insufficient documentation

## 2012-12-31 DIAGNOSIS — E079 Disorder of thyroid, unspecified: Secondary | ICD-10-CM | POA: Insufficient documentation

## 2012-12-31 DIAGNOSIS — E119 Type 2 diabetes mellitus without complications: Secondary | ICD-10-CM | POA: Insufficient documentation

## 2012-12-31 DIAGNOSIS — M79604 Pain in right leg: Secondary | ICD-10-CM

## 2012-12-31 DIAGNOSIS — IMO0002 Reserved for concepts with insufficient information to code with codable children: Secondary | ICD-10-CM | POA: Insufficient documentation

## 2012-12-31 DIAGNOSIS — M79609 Pain in unspecified limb: Secondary | ICD-10-CM | POA: Insufficient documentation

## 2012-12-31 DIAGNOSIS — Z794 Long term (current) use of insulin: Secondary | ICD-10-CM | POA: Insufficient documentation

## 2012-12-31 MED ORDER — HYDROCODONE-ACETAMINOPHEN 5-325 MG PO TABS
2.0000 | ORAL_TABLET | Freq: Once | ORAL | Status: AC
  Administered 2012-12-31: 2 via ORAL
  Filled 2012-12-31: qty 2

## 2012-12-31 MED ORDER — TRAMADOL HCL 50 MG PO TABS
50.0000 mg | ORAL_TABLET | Freq: Three times a day (TID) | ORAL | Status: DC | PRN
  Administered 2012-12-31: 50 mg via ORAL
  Filled 2012-12-31: qty 1

## 2012-12-31 MED ORDER — TRAMADOL HCL 50 MG PO TABS: 50.0000 mg | ORAL_TABLET | Freq: Four times a day (QID) | ORAL | Status: DC | PRN

## 2012-12-31 NOTE — ED Notes (Signed)
Right hip pain x 5 weeks. Her MD cannot see her for the pain until November 4th.

## 2012-12-31 NOTE — ED Provider Notes (Signed)
CSN: [id number]     Arrival date & time 12/31/12  1620 History   First MD Initiated Contact with Patient 12/31/12 1747     Chief Complaint  Patient presents with  . Hip Pain   (Consider location/radiation/quality/duration/timing/severity/associated sxs/prior Treatment) Patient is a 57 y.o. female presenting with hip pain. The history is provided by the patient. No language interpreter was used.  Hip Pain This is a new problem. The current episode started 1 to 4 weeks ago. The problem occurs constantly. Pertinent negatives include no chest pain, chills, fever or numbness. Associated symptoms comments: She complains of right hip pain without known injury for the past one month. No back pain. No history of diagnosed arthritis. No swelling, redness or fever. She states that she is also having pain in the medial right thigh extending distally to lower leg. She denies swelling of the lower leg. No history of blood clots. No SOB or chest pain..    Past Medical History  Diagnosis Date  . Hypertension   . Diabetes mellitus   . History of right coronary artery stent placement     2 stents placed at separate times  . Thyroid disease    Past Surgical History  Procedure Laterality Date  . Abdominal hysterectomy     No family history on file. History  Substance Use Topics  . Smoking status: Current Every Day Smoker -- 0.50 packs/day    Types: Cigarettes  . Smokeless tobacco: Never Used  . Alcohol Use: No   OB History   Grav Para Term Preterm Abortions TAB SAB Ect Mult Living                 Review of Systems  Constitutional: Negative for fever and chills.  Respiratory: Negative.  Negative for shortness of breath.   Cardiovascular: Negative.  Negative for chest pain.  Musculoskeletal:       See HPI.  Skin: Negative.   Neurological: Negative.  Negative for numbness.    Allergies  Amoxicillin; Morphine and related; Percocet; and Percodan  Home Medications   Current Outpatient Rx   Name  Route  Sig  Dispense  Refill  . albuterol (PROVENTIL HFA;VENTOLIN HFA) 108 (90 BASE) MCG/ACT inhaler   Inhalation   Inhale 2 puffs into the lungs every 2 (two) hours as needed for wheezing or shortness of breath (cough).   1 Inhaler   0   . aspirin 81 MG chewable tablet   Oral   Chew 81 mg by mouth daily.           Marland Kitchen atorvastatin (LIPITOR) 10 MG tablet   Oral   Take 10 mg by mouth daily.           . fluticasone (FLONASE) 50 MCG/ACT nasal spray   Nasal   Place 2 sprays into the nose daily.   16 g   0   . glipiZIDE (GLUCOTROL) 10 MG tablet   Oral   Take 10 mg by mouth 2 (two) times daily before a meal.           . insulin glargine (LANTUS) 100 UNIT/ML injection   Subcutaneous   Inject 60 Units into the skin at bedtime.          Marland Kitchen ketorolac (TORADOL) 10 MG tablet   Oral   Take 1 tablet (10 mg total) by mouth every 6 (six) hours as needed for pain.   20 tablet   0   . levothyroxine (SYNTHROID, LEVOTHROID) 125 MCG  tablet   Oral   Take 125 mcg by mouth daily.           . metoprolol succinate (TOPROL-XL) 25 MG 24 hr tablet   Oral   Take 25 mg by mouth daily. Patient took this medication yesterday between 12:30 pm- 1:00 pm.         . pregabalin (LYRICA) 300 MG capsule   Oral   Take 1 capsule (300 mg total) by mouth daily.   30 capsule   0   . Spacer/Aero-Holding Chambers (AEROCHAMBER PLUS WITH MASK) inhaler      Use as instructed   1 each   2   . traMADol (ULTRAM) 50 MG tablet   Oral   Take 50 mg by mouth every 6 (six) hours as needed. For pain.          BP 142/68  Pulse 101  Temp(Src) 98.8 F (37.1 C) (Oral)  Resp 20  Ht 4\' 11"  (1.499 m)  Wt 140 lb (63.504 kg)  BMI 28.26 kg/m2  SpO2 100% Physical Exam  Constitutional: She is oriented to person, place, and time. She appears well-developed and well-nourished. No distress.  Cardiovascular: Intact distal pulses.   Intact femoral pulse.  Abdominal: There is no tenderness.   Musculoskeletal: Normal range of motion. She exhibits no edema.  Right hip pain with palpation over lateral joint. No lumbar, sciatic tenderness or swelling. Medial thigh from proximal thigh to distal is tender without redness or swelling. Tenderness extends into lower extremity.   Neurological: She is alert and oriented to person, place, and time.  Skin: Skin is warm and dry. No rash noted. No erythema.  Psychiatric: She has a normal mood and affect.    ED Course  Procedures (including critical care time) Labs Review Labs Reviewed - No data to display Imaging Review No results found.  MDM  No diagnosis found. 1. Lower extremity pain 2. Right hip pain  Suspect joint pain, possibly arthritis in right hip. Cannot rule out DVT causing medial thigh and calf pain but consider it unlikely. Will get doppler study in a.m.    Arnoldo Hooker, PA-C 12/31/12 1851

## 2013-01-01 ENCOUNTER — Ambulatory Visit (HOSPITAL_COMMUNITY)
Admission: RE | Admit: 2013-01-01 | Discharge: 2013-01-01 | Disposition: A | Discharge status: Home / Self Care | Payer: Self-pay | Source: Ambulatory Visit | Attending: Emergency Medicine | Admitting: Emergency Medicine

## 2013-01-01 ENCOUNTER — Emergency Department (HOSPITAL_BASED_OUTPATIENT_CLINIC_OR_DEPARTMENT_OTHER)
Admission: EM | Admit: 2013-01-01 | Discharge: 2013-01-01 | Disposition: A | Discharge status: Home / Self Care | Payer: Self-pay | Attending: Emergency Medicine | Admitting: Emergency Medicine

## 2013-01-01 ENCOUNTER — Encounter (HOSPITAL_BASED_OUTPATIENT_CLINIC_OR_DEPARTMENT_OTHER): Payer: Self-pay | Admitting: Emergency Medicine

## 2013-01-01 DIAGNOSIS — M79609 Pain in unspecified limb: Secondary | ICD-10-CM

## 2013-01-01 DIAGNOSIS — M79604 Pain in right leg: Secondary | ICD-10-CM

## 2013-01-01 DIAGNOSIS — M5430 Sciatica, unspecified side: Secondary | ICD-10-CM | POA: Insufficient documentation

## 2013-01-01 DIAGNOSIS — IMO0002 Reserved for concepts with insufficient information to code with codable children: Secondary | ICD-10-CM | POA: Insufficient documentation

## 2013-01-01 DIAGNOSIS — I10 Essential (primary) hypertension: Secondary | ICD-10-CM | POA: Insufficient documentation

## 2013-01-01 DIAGNOSIS — E119 Type 2 diabetes mellitus without complications: Secondary | ICD-10-CM | POA: Insufficient documentation

## 2013-01-01 DIAGNOSIS — M25551 Pain in right hip: Secondary | ICD-10-CM

## 2013-01-01 DIAGNOSIS — Z9861 Coronary angioplasty status: Secondary | ICD-10-CM | POA: Insufficient documentation

## 2013-01-01 DIAGNOSIS — E079 Disorder of thyroid, unspecified: Secondary | ICD-10-CM | POA: Insufficient documentation

## 2013-01-01 DIAGNOSIS — Z7982 Long term (current) use of aspirin: Secondary | ICD-10-CM | POA: Insufficient documentation

## 2013-01-01 DIAGNOSIS — Z794 Long term (current) use of insulin: Secondary | ICD-10-CM | POA: Insufficient documentation

## 2013-01-01 DIAGNOSIS — F17200 Nicotine dependence, unspecified, uncomplicated: Secondary | ICD-10-CM | POA: Insufficient documentation

## 2013-01-01 DIAGNOSIS — Z880 Allergy status to penicillin: Secondary | ICD-10-CM | POA: Insufficient documentation

## 2013-01-01 DIAGNOSIS — Z79899 Other long term (current) drug therapy: Secondary | ICD-10-CM | POA: Insufficient documentation

## 2013-01-01 DIAGNOSIS — M25559 Pain in unspecified hip: Secondary | ICD-10-CM | POA: Insufficient documentation

## 2013-01-01 MED ORDER — DEXAMETHASONE SODIUM PHOSPHATE 10 MG/ML IJ SOLN
10.0000 mg | Freq: Once | INTRAMUSCULAR | Status: AC
  Administered 2013-01-01: 10 mg via INTRAMUSCULAR
  Filled 2013-01-01: qty 1

## 2013-01-01 NOTE — ED Notes (Signed)
Pt reports she was seen as a patient in the ED last night and was sent for ultrasound this am. Pt states was told to come back today after u/s for steriod shot for her hip/back pain

## 2013-01-01 NOTE — ED Provider Notes (Signed)
Medical screening examination/treatment/procedure(s) were performed by non-physician practitioner and as supervising physician I was immediately available for consultation/collaboration.   Shanna Cisco, MD 01/01/13 1300

## 2013-01-01 NOTE — Progress Notes (Signed)
*  Preliminary Results* Right lower extremity venous duplex completed. Right lower extremity is negative for deep vein thrombosis. There is no evidence of right Baker's cyst. The right popliteal vein appears to be dilated versus aneurysmal without evidence of thrombosis.  01/01/2013 11:40 AM  Gertie Fey, RVT, RDCS, RDMS

## 2013-01-01 NOTE — ED Provider Notes (Signed)
CSN: [id number]     Arrival date & time 01/01/13  1218 History   First MD Initiated Contact with Patient 01/01/13 1232     Chief Complaint  Patient presents with  . Back Pain  . Sciatica   (Consider location/radiation/quality/duration/timing/severity/associated sxs/prior Treatment) Patient is a 57 y.o. female presenting with hip pain. The history is provided by the patient. No language interpreter was used.  Hip Pain This is a new problem. Pertinent negatives include no chills, fever, numbness or weakness. Associated symptoms comments: Patient seen yesterday by me for same right hip pain she presents with today, extending to medial thigh and into lower extremity. No back pain. No change from yesterday. She has not taken the Ultram prescribed. She returns to receive a steroid injection. She went to [facility] this morning and reports the doppler study is negative for DVT.Marland Kitchen    Past Medical History  Diagnosis Date  . Hypertension   . Diabetes mellitus   . History of right coronary artery stent placement     2 stents placed at separate times  . Thyroid disease    Past Surgical History  Procedure Laterality Date  . Abdominal hysterectomy     No family history on file. History  Substance Use Topics  . Smoking status: Current Every Day Smoker -- 0.50 packs/day    Types: Cigarettes  . Smokeless tobacco: Never Used  . Alcohol Use: No   OB History   Grav Para Term Preterm Abortions TAB SAB Ect Mult Living                 Review of Systems  Constitutional: Negative for fever and chills.  Musculoskeletal:       See HPI.  Skin: Negative.   Neurological: Negative.  Negative for weakness and numbness.    Allergies  Amoxicillin; Morphine and related; Percocet; and Percodan  Home Medications   Current Outpatient Rx  Name  Route  Sig  Dispense  Refill  . albuterol (PROVENTIL HFA;VENTOLIN HFA) 108 (90 BASE) MCG/ACT inhaler   Inhalation   Inhale 2 puffs into the lungs every 2 (two)  hours as needed for wheezing or shortness of breath (cough).   1 Inhaler   0   . aspirin 81 MG chewable tablet   Oral   Chew 81 mg by mouth daily.           Marland Kitchen atorvastatin (LIPITOR) 10 MG tablet   Oral   Take 10 mg by mouth daily.           . fluticasone (FLONASE) 50 MCG/ACT nasal spray   Nasal   Place 2 sprays into the nose daily.   16 g   0   . glipiZIDE (GLUCOTROL) 10 MG tablet   Oral   Take 10 mg by mouth 2 (two) times daily before a meal.           . insulin glargine (LANTUS) 100 UNIT/ML injection   Subcutaneous   Inject 60 Units into the skin at bedtime.          Marland Kitchen ketorolac (TORADOL) 10 MG tablet   Oral   Take 1 tablet (10 mg total) by mouth every 6 (six) hours as needed for pain.   20 tablet   0   . levothyroxine (SYNTHROID, LEVOTHROID) 125 MCG tablet   Oral   Take 125 mcg by mouth daily.           . metoprolol succinate (TOPROL-XL) 25 MG 24 hr tablet  Oral   Take 25 mg by mouth daily. Patient took this medication yesterday between 12:30 pm- 1:00 pm.         . pregabalin (LYRICA) 300 MG capsule   Oral   Take 1 capsule (300 mg total) by mouth daily.   30 capsule   0   . Spacer/Aero-Holding Chambers (AEROCHAMBER PLUS WITH MASK) inhaler      Use as instructed   1 each   2   . traMADol (ULTRAM) 50 MG tablet   Oral   Take 50 mg by mouth every 6 (six) hours as needed. For pain.         . traMADol (ULTRAM) 50 MG tablet   Oral   Take 1 tablet (50 mg total) by mouth every 6 (six) hours as needed for pain.   15 tablet   0    BP 152/65  Pulse 88  Temp(Src) 98.6 F (37 C) (Oral)  Resp 16  SpO2 100% Physical Exam  Constitutional: She is oriented to person, place, and time. She appears well-developed and well-nourished. No distress.  Cardiovascular: Intact distal pulses.   Pulmonary/Chest: Effort normal.  Musculoskeletal:  Right leg without swelling, discoloration or deformity. She is ambulatory and has full weight bearing ability.  Tender right hip and medial thigh from proximal to distal thigh and into proximal lower leg.   Neurological: She is alert and oriented to person, place, and time.    ED Course  Procedures (including critical care time) Labs Review Labs Reviewed - No data to display Imaging Review No results found.  MDM  No diagnosis found. 1. Right hip pain 2. Right lower extremity pain  Doppler report reviewed and found to be negative. Steroid shot provided for patient. Questions answered for son and for patient. Stable for discharge.     Arnoldo Hooker, PA-C 01/01/13 1259

## 2013-01-02 NOTE — ED Provider Notes (Signed)
Medical screening examination/treatment/procedure(s) were performed by non-physician practitioner and as supervising physician I was immediately available for consultation/collaboration.   Gwyneth Sprout, MD 01/02/13 [phone]

## 2014-01-04 ENCOUNTER — Encounter (HOSPITAL_BASED_OUTPATIENT_CLINIC_OR_DEPARTMENT_OTHER): Payer: Self-pay | Admitting: Emergency Medicine

## 2014-01-04 ENCOUNTER — Emergency Department (HOSPITAL_BASED_OUTPATIENT_CLINIC_OR_DEPARTMENT_OTHER)
Admission: EM | Admit: 2014-01-04 | Discharge: 2014-01-04 | Disposition: A | Discharge status: Home / Self Care | Payer: Self-pay | Attending: Emergency Medicine | Admitting: Emergency Medicine

## 2014-01-04 ENCOUNTER — Emergency Department (HOSPITAL_BASED_OUTPATIENT_CLINIC_OR_DEPARTMENT_OTHER): Payer: Self-pay

## 2014-01-04 DIAGNOSIS — IMO0002 Reserved for concepts with insufficient information to code with codable children: Secondary | ICD-10-CM | POA: Insufficient documentation

## 2014-01-04 DIAGNOSIS — E119 Type 2 diabetes mellitus without complications: Secondary | ICD-10-CM | POA: Insufficient documentation

## 2014-01-04 DIAGNOSIS — Z7982 Long term (current) use of aspirin: Secondary | ICD-10-CM | POA: Insufficient documentation

## 2014-01-04 DIAGNOSIS — E079 Disorder of thyroid, unspecified: Secondary | ICD-10-CM | POA: Insufficient documentation

## 2014-01-04 DIAGNOSIS — J40 Bronchitis, not specified as acute or chronic: Secondary | ICD-10-CM | POA: Insufficient documentation

## 2014-01-04 DIAGNOSIS — Z79899 Other long term (current) drug therapy: Secondary | ICD-10-CM | POA: Insufficient documentation

## 2014-01-04 DIAGNOSIS — R059 Cough, unspecified: Secondary | ICD-10-CM | POA: Insufficient documentation

## 2014-01-04 DIAGNOSIS — F17200 Nicotine dependence, unspecified, uncomplicated: Secondary | ICD-10-CM | POA: Insufficient documentation

## 2014-01-04 DIAGNOSIS — R05 Cough: Secondary | ICD-10-CM | POA: Insufficient documentation

## 2014-01-04 DIAGNOSIS — Z9861 Coronary angioplasty status: Secondary | ICD-10-CM | POA: Insufficient documentation

## 2014-01-04 DIAGNOSIS — I10 Essential (primary) hypertension: Secondary | ICD-10-CM | POA: Insufficient documentation

## 2014-01-04 DIAGNOSIS — Z880 Allergy status to penicillin: Secondary | ICD-10-CM | POA: Insufficient documentation

## 2014-01-04 MED ORDER — AZITHROMYCIN 250 MG PO TABS: 250.0000 mg | ORAL_TABLET | Freq: Every day | ORAL | Status: DC

## 2014-01-04 MED ORDER — FLUCONAZOLE 150 MG PO TABS: 150.0000 mg | ORAL_TABLET | Freq: Once | ORAL | Status: DC

## 2014-01-04 MED ORDER — PREDNISONE 20 MG PO TABS: 60.0000 mg | ORAL_TABLET | Freq: Every day | ORAL | Status: DC

## 2014-01-04 MED ORDER — ALBUTEROL SULFATE HFA 108 (90 BASE) MCG/ACT IN AERS: 1.0000 | INHALATION_SPRAY | Freq: Four times a day (QID) | RESPIRATORY_TRACT | Status: DC | PRN

## 2014-01-04 NOTE — Discharge Instructions (Signed)

## 2014-01-04 NOTE — ED Notes (Signed)
Pt reports dry cough since Sunday.  Denies fever.

## 2014-01-04 NOTE — ED Provider Notes (Signed)
CSN: [id number]     Arrival date & time 01/04/14  1827 History   First MD Initiated Contact with Patient 01/04/14 1950     Chief Complaint  Patient presents with  . Cough     (Consider location/radiation/quality/duration/timing/severity/associated sxs/prior Treatment) Patient is a 58 y.o. female presenting with cough. The history is provided by the patient. No language interpreter was used.  Cough Cough characteristics:  Non-productive Severity:  Moderate Timing:  Constant Smoker: no   Associated symptoms: chills   Associated symptoms: no fever, no myalgias and no rash   Associated symptoms comment:  Cough and chest congestion for the past 7 days. She denies known fever. She has tried taking OTC medications, including Mucinex, without relief.    Past Medical History  Diagnosis Date  . Hypertension   . Diabetes mellitus   . History of right coronary artery stent placement     2 stents placed at separate times  . Thyroid disease    Past Surgical History  Procedure Laterality Date  . Abdominal hysterectomy     History reviewed. No pertinent family history. History  Substance Use Topics  . Smoking status: Current Every Day Smoker -- 0.50 packs/day    Types: Cigarettes  . Smokeless tobacco: Never Used  . Alcohol Use: No   OB History   Grav Para Term Preterm Abortions TAB SAB Ect Mult Living                 Review of Systems  Constitutional: Positive for chills. Negative for fever.  HENT: Positive for congestion and sinus pressure.   Respiratory: Positive for cough and chest tightness.   Cardiovascular: Negative.   Gastrointestinal: Negative.  Negative for nausea, vomiting and abdominal pain.  Musculoskeletal: Negative.  Negative for myalgias.  Skin: Negative.  Negative for rash.  Neurological: Negative.       Allergies  Amoxicillin; Morphine and related; Percocet; and Percodan  Home Medications   Prior to Admission medications   Medication Sig Start Date  End Date Taking? Authorizing Provider  clopidogrel (PLAVIX) 75 MG tablet Take 75 mg by mouth once a week.   Yes Historical Provider, MD  gabapentin (NEURONTIN) 300 MG capsule Take 300 mg by mouth 3 (three) times daily.   Yes Historical Provider, MD  metFORMIN (GLUCOPHAGE) 500 MG tablet Take 500 mg by mouth daily with breakfast.   Yes Historical Provider, MD  albuterol (PROVENTIL HFA;VENTOLIN HFA) 108 (90 BASE) MCG/ACT inhaler Inhale 2 puffs into the lungs every 2 (two) hours as needed for wheezing or shortness of breath (cough). 04/22/12   Babette Relic, MD  aspirin 81 MG chewable tablet Chew 81 mg by mouth daily.      Historical Provider, MD  atorvastatin (LIPITOR) 10 MG tablet Take 10 mg by mouth daily.      Historical Provider, MD  fluticasone (FLONASE) 50 MCG/ACT nasal spray Place 2 sprays into the nose daily. 04/22/12   Babette Relic, MD  insulin glargine (LANTUS) 100 UNIT/ML injection Inject 60 Units into the skin at bedtime.     Historical Provider, MD  ketorolac (TORADOL) 10 MG tablet Take 1 tablet (10 mg total) by mouth every 6 (six) hours as needed for pain. 04/22/12   Babette Relic, MD  levothyroxine (SYNTHROID, LEVOTHROID) 125 MCG tablet Take 125 mcg by mouth daily.      Historical Provider, MD  metoprolol succinate (TOPROL-XL) 25 MG 24 hr tablet Take 25 mg by mouth daily. Patient took this medication  yesterday between 12:30 pm- 1:00 pm.    Historical Provider, MD  Spacer/Aero-Holding Chambers (AEROCHAMBER PLUS WITH MASK) inhaler Use as instructed 04/22/12   Babette Relic, MD  traMADol (ULTRAM) 50 MG tablet Take 50 mg by mouth every 6 (six) hours as needed. For pain.    Historical Provider, MD  traMADol (ULTRAM) 50 MG tablet Take 1 tablet (50 mg total) by mouth every 6 (six) hours as needed for pain. 12/31/12   Earnest Mcgillis A Emonte Dieujuste, PA-C   BP 150/57  Pulse 110  Temp(Src) 99.6 F (37.6 C) (Oral)  Resp 18  Ht 4\' 11"  (1.499 m)  Wt 120 lb (54.432 kg)  BMI 24.22 kg/m2  SpO2 98% Physical Exam   Constitutional: She is oriented to person, place, and time. She appears well-developed and well-nourished.  HENT:  Head: Normocephalic.  Nose: Mucosal edema present.  Mouth/Throat: Mucous membranes are normal. Posterior oropharyngeal erythema present. No posterior oropharyngeal edema.  Neck: Normal range of motion. Neck supple.  Cardiovascular: Normal rate and regular rhythm.   No murmur heard. Pulmonary/Chest: Effort normal. She has wheezes. She has rales.  Abdominal: Soft. Bowel sounds are normal. There is no tenderness. There is no rebound and no guarding.  Musculoskeletal: Normal range of motion.  Neurological: She is alert and oriented to person, place, and time.  Skin: Skin is warm and dry. No rash noted.  Psychiatric: She has a normal mood and affect.    ED Course  Procedures (including critical care time) Labs Review Labs Reviewed - No data to display  Imaging Review Dg Chest 2 View  01/04/2014   CLINICAL DATA:  Cough.  EXAM: CHEST  2 VIEW  COMPARISON:  04/22/2012  FINDINGS: Coronary artery stent noted. Cardiac and mediastinal margins appear normal. The lungs appear clear. No pleural effusion.  IMPRESSION: 1. No significant abnormality observed.   Electronically Signed   By: Sherryl Barters M.D.   On: 01/04/2014 19:01     EKG Interpretation None      MDM   Final diagnoses:  None    1. Bronchitis  No hypoxia. VS improved. She is non-toxic in appearance with evidence of bronchitis on exam. Inhaler provided. Will treat with abx and prednisone. Encouraged close PCP follow up which is scheduled for this week.     Dewaine Oats, PA-C 01/04/14 2036

## 2014-01-05 NOTE — ED Provider Notes (Signed)
Medical screening examination/treatment/procedure(s) were performed by non-physician practitioner and as supervising physician I was immediately available for consultation/collaboration.   EKG Interpretation None        Malvin Johns, MD 01/05/14 0022

## 2014-08-12 ENCOUNTER — Inpatient Hospital Stay (HOSPITAL_BASED_OUTPATIENT_CLINIC_OR_DEPARTMENT_OTHER)
Admission: EM | Admit: 2014-08-12 | Discharge: 2014-08-14 | DRG: 641 | Disposition: A | Discharge status: Home / Self Care | Payer: Self-pay | Attending: Internal Medicine | Admitting: Internal Medicine

## 2014-08-12 ENCOUNTER — Encounter (HOSPITAL_BASED_OUTPATIENT_CLINIC_OR_DEPARTMENT_OTHER): Payer: Self-pay | Admitting: Emergency Medicine

## 2014-08-12 DIAGNOSIS — Z955 Presence of coronary angioplasty implant and graft: Secondary | ICD-10-CM

## 2014-08-12 DIAGNOSIS — Z7982 Long term (current) use of aspirin: Secondary | ICD-10-CM

## 2014-08-12 DIAGNOSIS — N952 Postmenopausal atrophic vaginitis: Secondary | ICD-10-CM | POA: Diagnosis present

## 2014-08-12 DIAGNOSIS — E1165 Type 2 diabetes mellitus with hyperglycemia: Secondary | ICD-10-CM | POA: Diagnosis present

## 2014-08-12 DIAGNOSIS — R634 Abnormal weight loss: Secondary | ICD-10-CM | POA: Diagnosis present

## 2014-08-12 DIAGNOSIS — E785 Hyperlipidemia, unspecified: Secondary | ICD-10-CM | POA: Diagnosis present

## 2014-08-12 DIAGNOSIS — E876 Hypokalemia: Principal | ICD-10-CM | POA: Diagnosis present

## 2014-08-12 DIAGNOSIS — I2510 Atherosclerotic heart disease of native coronary artery without angina pectoris: Secondary | ICD-10-CM | POA: Diagnosis present

## 2014-08-12 DIAGNOSIS — E039 Hypothyroidism, unspecified: Secondary | ICD-10-CM | POA: Diagnosis present

## 2014-08-12 DIAGNOSIS — D638 Anemia in other chronic diseases classified elsewhere: Secondary | ICD-10-CM | POA: Diagnosis present

## 2014-08-12 DIAGNOSIS — F17210 Nicotine dependence, cigarettes, uncomplicated: Secondary | ICD-10-CM | POA: Diagnosis present

## 2014-08-12 DIAGNOSIS — I10 Essential (primary) hypertension: Secondary | ICD-10-CM | POA: Diagnosis present

## 2014-08-12 DIAGNOSIS — Z794 Long term (current) use of insulin: Secondary | ICD-10-CM

## 2014-08-12 DIAGNOSIS — IMO0002 Reserved for concepts with insufficient information to code with codable children: Secondary | ICD-10-CM | POA: Diagnosis present

## 2014-08-12 DIAGNOSIS — Z79899 Other long term (current) drug therapy: Secondary | ICD-10-CM

## 2014-08-12 DIAGNOSIS — R197 Diarrhea, unspecified: Secondary | ICD-10-CM | POA: Diagnosis present

## 2014-08-12 LAB — CBG MONITORING, ED: Glucose-Capillary: 352 mg/dL — ABNORMAL HIGH (ref 70–99)

## 2014-08-12 MED ORDER — KETOROLAC TROMETHAMINE 30 MG/ML IJ SOLN
30.0000 mg | Freq: Once | INTRAMUSCULAR | Status: AC
  Administered 2014-08-13: 30 mg via INTRAVENOUS
  Filled 2014-08-12: qty 1

## 2014-08-12 MED ORDER — SODIUM CHLORIDE 0.9 % IV BOLUS (SEPSIS)
500.0000 mL | Freq: Once | INTRAVENOUS | Status: AC
  Administered 2014-08-13: 500 mL via INTRAVENOUS

## 2014-08-12 NOTE — ED Notes (Signed)
Pt reports large boil to her genital area, makes it difficult for her to void, pt stated that when she was prescribed multiple creams for skin infections by pcp she did not make her aware of boil.

## 2014-08-12 NOTE — ED Notes (Signed)
Pt reports that she has places on her back and arms that are causing her pain, also reports that she her bloodsugar has been > 200 for months, pt states she has followed up with pcp but nothing has worked

## 2014-08-12 NOTE — ED Provider Notes (Signed)
CSN: [id number]     Arrival date & time 08/12/14  2252 History  This chart was scribed for Ruffus Kamaka, MD by Evelene Croon, ED Scribe. This patient was seen in room MH10/MH10 and the patient's care was started 11:23 PM.    Chief Complaint  Patient presents with  . Recurrent Skin Infections   Patient is a 59 y.o. female presenting with groin pain. The history is provided by the patient. No language interpreter was used.  Groin Pain This is a new problem. The current episode started more than 1 week ago. The problem occurs constantly. The problem has not changed since onset.Pertinent negatives include no chest pain, no abdominal pain, no headaches and no shortness of breath. Nothing aggravates the symptoms. Nothing relieves the symptoms. Treatments tried: triamcinolone and clobetalol. The treatment provided no relief.     HPI Comments:  Laura Oconnor is a 59 y.o. female with a h/o DM who presents to the Emergency Department complaining of not eating well for weeks and fatigue.  Pt states she feels dehydrated; notes she has only been chewing ice as her source of hydration and eating a small meal in the am of sausage and an egg no lunch and only a few spoonfuls of dinner if someone cooks. She also reports hyperglycemia which is ongoing but she is on lantus and metformin and her doctors will not tell her which one to increase to bring down her sugars. She states she eats very little and has no appetite. Pt reports skin rash to buttocks and genitals, back and RUE. She has been evaluated for this issue and has been placed on clabetasol cream and triamcinolone which she has been using with little relief. She denies vomiting and fever.      Past Medical History  Diagnosis Date  . Hypertension   . Diabetes mellitus   . History of right coronary artery stent placement     2 stents placed at separate times  . Thyroid disease    Past Surgical History  Procedure Laterality Date  . Abdominal  hysterectomy     History reviewed. No pertinent family history. History  Substance Use Topics  . Smoking status: Current Every Day Smoker -- 0.50 packs/day    Types: Cigarettes  . Smokeless tobacco: Never Used  . Alcohol Use: No   OB History    No data available     Review of Systems  Constitutional: Positive for appetite change and fatigue. Negative for fever.       Hyperglycemia  Respiratory: Negative for shortness of breath.   Cardiovascular: Negative for chest pain.  Gastrointestinal: Negative for vomiting and abdominal pain.  Skin: Positive for rash.  Neurological: Negative for headaches.  All other systems reviewed and are negative.     Allergies  Amoxicillin; Morphine and related; Percocet; and Percodan  Home Medications   Prior to Admission medications   Medication Sig Start Date End Date Taking? Authorizing Provider  aspirin 81 MG chewable tablet Chew 81 mg by mouth daily.     Yes Historical Provider, MD  atorvastatin (LIPITOR) 10 MG tablet Take 10 mg by mouth daily.     Yes Historical Provider, MD  clobetasol cream (TEMOVATE) 4.09 % Apply 1 application topically 2 (two) times daily.   Yes Historical Provider, MD  gabapentin (NEURONTIN) 300 MG capsule Take 300 mg by mouth 3 (three) times daily.   Yes Historical Provider, MD  insulin glargine (LANTUS) 100 UNIT/ML injection Inject 60 Units into the  skin at bedtime.    Yes Historical Provider, MD  levothyroxine (SYNTHROID, LEVOTHROID) 125 MCG tablet Take 125 mcg by mouth daily.     Yes Historical Provider, MD  metFORMIN (GLUCOPHAGE) 500 MG tablet Take 500 mg by mouth daily with breakfast.   Yes Historical Provider, MD  metoprolol succinate (TOPROL-XL) 25 MG 24 hr tablet Take 25 mg by mouth daily. Patient took this medication yesterday between 12:30 pm- 1:00 pm.   Yes Historical Provider, MD  triamcinolone (KENALOG) 0.025 % cream Apply 1 application topically 2 (two) times daily.   Yes Historical Provider, MD   albuterol (PROVENTIL HFA;VENTOLIN HFA) 108 (90 BASE) MCG/ACT inhaler Inhale 2 puffs into the lungs every 2 (two) hours as needed for wheezing or shortness of breath (cough). 04/22/12   Riki Altes, MD  albuterol (PROVENTIL HFA;VENTOLIN HFA) 108 (90 BASE) MCG/ACT inhaler Inhale 1-2 puffs into the lungs every 6 (six) hours as needed for wheezing or shortness of breath. 01/04/14   Charlann Lange, PA-C  azithromycin (ZITHROMAX) 250 MG tablet Take 1 tablet (250 mg total) by mouth daily. Take first 2 tablets together, then 1 every day until finished. 01/04/14   Charlann Lange, PA-C  fluconazole (DIFLUCAN) 150 MG tablet Take 1 tablet (150 mg total) by mouth once. 01/04/14   Charlann Lange, PA-C  fluticasone (FLONASE) 50 MCG/ACT nasal spray Place 2 sprays into the nose daily. 04/22/12   Riki Altes, MD  ketorolac (TORADOL) 10 MG tablet Take 1 tablet (10 mg total) by mouth every 6 (six) hours as needed for pain. 04/22/12   Riki Altes, MD  predniSONE (DELTASONE) 20 MG tablet Take 3 tablets (60 mg total) by mouth daily. 01/04/14   Charlann Lange, PA-C  Spacer/Aero-Holding Chambers (AEROCHAMBER PLUS WITH MASK) inhaler Use as instructed 04/22/12   Riki Altes, MD  traMADol (ULTRAM) 50 MG tablet Take 50 mg by mouth every 6 (six) hours as needed. For pain.    Historical Provider, MD  traMADol (ULTRAM) 50 MG tablet Take 1 tablet (50 mg total) by mouth every 6 (six) hours as needed for pain. 12/31/12   Shari Upstill, PA-C   BP 172/62 mmHg  Pulse 95  Temp(Src) 99.1 F (37.3 C) (Oral)  Resp 20  Ht 4\' 11"  (1.499 m)  Wt 115 lb (52.164 kg)  BMI 23.21 kg/m2  SpO2 98% Physical Exam  Constitutional: She is oriented to person, place, and time. She appears well-developed and well-nourished. No distress.  HENT:  Head: Normocephalic and atraumatic.  Mouth/Throat: Oropharynx is clear and moist. No oropharyngeal exudate.  Eyes: Conjunctivae and EOM are normal. Pupils are equal, round, and reactive to light.  Neck: Normal range of  motion. Neck supple. No tracheal deviation present.  Cardiovascular: Normal rate, regular rhythm and normal heart sounds.   Pulmonary/Chest: Effort normal and breath sounds normal. No respiratory distress. She has no wheezes. She has no rales.  Abdominal: Soft. Bowel sounds are normal. She exhibits no distension and no mass. There is no tenderness. There is no rebound and no guarding.  Genitourinary:  Atrophic vaginitis   Chaperone  was present for exam which was performed with no discomfort or complications.    Musculoskeletal: Normal range of motion. She exhibits no edema.  Neurological: She is alert and oriented to person, place, and time. She has normal reflexes.  Skin: Skin is warm and dry.  3 lesions c/w scabbed over hair follicles   Psychiatric: She has a normal mood and affect. Her behavior is normal.  Nursing  note and vitals reviewed.   ED Course  Procedures   DIAGNOSTIC STUDIES:  Oxygen Saturation is 98% on RA, normal by my interpretation.    COORDINATION OF CARE:  11:32 PM Discussed treatment plan with pt at bedside and pt agreed to plan.   Labs Review Labs Reviewed  CBG MONITORING, ED - Abnormal; Notable for the following:    Glucose-Capillary 352 (*)    All other components within normal limits    Imaging Review No results found.   EKG Interpretation None      MDM   Final diagnoses:  None     EKG Interpretation  Date/Time:  Thursday Schwanda Zima 28 2016 01:28:14 EDT Ventricular Rate:  81 PR Interval:  194 QRS Duration: 104 QT Interval:  424 QTC Calculation: 492 R Axis:   12 Text Interpretation:  Normal sinus rhythm Confirmed by [facility]  MD, Katalia Choma ([id number]) on 08/13/2014 1:50:49 AM      Results for orders placed or performed during the hospital encounter of 08/12/14  Urinalysis, Routine w reflex microscopic  Result Value Ref Range   Color, Urine STRAW (A) YELLOW   APPearance CLEAR CLEAR   Specific Gravity, Urine 1.028 1.005 - 1.030   pH 6.5  5.0 - 8.0   Glucose, UA >1000 (A) NEGATIVE mg/dL   Hgb urine dipstick NEGATIVE NEGATIVE   Bilirubin Urine NEGATIVE NEGATIVE   Ketones, ur NEGATIVE NEGATIVE mg/dL   Protein, ur NEGATIVE NEGATIVE mg/dL   Urobilinogen, UA 1.0 0.0 - 1.0 mg/dL   Nitrite NEGATIVE NEGATIVE   Leukocytes, UA NEGATIVE NEGATIVE  Urine microscopic-add on  Result Value Ref Range   Squamous Epithelial / LPF FEW (A) RARE   WBC, UA 0-2 <3 WBC/hpf   RBC / HPF 0-2 <3 RBC/hpf  Magnesium  Result Value Ref Range   Magnesium 1.9 1.5 - 2.5 mg/dL  Basic metabolic panel  Result Value Ref Range   Sodium 136 135 - 145 mmol/L   Potassium <2.0 (LL) 3.5 - 5.1 mmol/L   Chloride 100 96 - 112 mmol/L   CO2 29 19 - 32 mmol/L   Glucose, Bld 340 (H) 70 - 99 mg/dL   BUN 5 (L) 6 - 23 mg/dL   Creatinine, Ser 0.40 (L) 0.50 - 1.10 mg/dL   Calcium 7.9 (L) 8.4 - 10.5 mg/dL   GFR calc non Af Amer >90 >90 mL/min   GFR calc Af Amer >90 >90 mL/min   Anion gap 7 5 - 15  CBG monitoring, ED  Result Value Ref Range   Glucose-Capillary 352 (H) 70 - 99 mg/dL   No results found.  Potassium re run 2 times on  Original lab and then redrawn at a second site and is persistently low.    Medications  potassium chloride 10 mEq in 100 mL IVPB (10 mEq Intravenous New Bag/Given 08/13/14 0138)  sodium chloride 0.9 % bolus 500 mL (0 mLs Intravenous Stopped 08/13/14 0041)  ketorolac (TORADOL) 30 MG/ML injection 30 mg (30 mg Intravenous Given 08/13/14 0008)  potassium chloride SA (K-DUR,KLOR-CON) CR tablet 40 mEq (40 mEq Oral Given 08/13/14 0214)  calcium carbonate (TUMS - dosed in mg elemental calcium) chewable tablet 200 mg of elemental calcium (200 mg of elemental calcium Oral Given 08/13/14 0214)  calcium carbonate (TUMS - dosed in mg elemental calcium) chewable tablet 200 mg of elemental calcium (200 mg of elemental calcium Oral Given 08/13/14 0214)     I personally performed the services described in this documentation, which was scribed in  my presence.  The recorded information has been reviewed and is accurate.     Veatrice Kells, MD 08/13/14 [phone]

## 2014-08-13 ENCOUNTER — Emergency Department (HOSPITAL_BASED_OUTPATIENT_CLINIC_OR_DEPARTMENT_OTHER): Payer: Self-pay

## 2014-08-13 ENCOUNTER — Encounter (HOSPITAL_BASED_OUTPATIENT_CLINIC_OR_DEPARTMENT_OTHER): Payer: Self-pay | Admitting: Emergency Medicine

## 2014-08-13 DIAGNOSIS — E876 Hypokalemia: Principal | ICD-10-CM

## 2014-08-13 DIAGNOSIS — C50919 Malignant neoplasm of unspecified site of unspecified female breast: Secondary | ICD-10-CM | POA: Insufficient documentation

## 2014-08-13 DIAGNOSIS — R197 Diarrhea, unspecified: Secondary | ICD-10-CM

## 2014-08-13 DIAGNOSIS — E039 Hypothyroidism, unspecified: Secondary | ICD-10-CM

## 2014-08-13 DIAGNOSIS — E1165 Type 2 diabetes mellitus with hyperglycemia: Secondary | ICD-10-CM

## 2014-08-13 DIAGNOSIS — R634 Abnormal weight loss: Secondary | ICD-10-CM

## 2014-08-13 DIAGNOSIS — I2510 Atherosclerotic heart disease of native coronary artery without angina pectoris: Secondary | ICD-10-CM

## 2014-08-13 DIAGNOSIS — N952 Postmenopausal atrophic vaginitis: Secondary | ICD-10-CM

## 2014-08-13 DIAGNOSIS — IMO0002 Reserved for concepts with insufficient information to code with codable children: Secondary | ICD-10-CM | POA: Diagnosis present

## 2014-08-13 LAB — COMPREHENSIVE METABOLIC PANEL
ALT: 8 U/L (ref 0–35)
AST: 12 U/L (ref 0–37)
Albumin: 2.8 g/dL — ABNORMAL LOW (ref 3.5–5.2)
Alkaline Phosphatase: 122 U/L — ABNORMAL HIGH (ref 39–117)
Anion gap: 7 (ref 5–15)
BUN: <5 mg/dL — ABNORMAL LOW (ref 6–23)
CO2: 30 mmol/L (ref 19–32)
Calcium: 8.2 mg/dL — ABNORMAL LOW (ref 8.4–10.5)
Chloride: 102 mmol/L (ref 96–112)
Creatinine, Ser: 0.44 mg/dL — ABNORMAL LOW (ref 0.50–1.10)
GFR calc Af Amer: >90 mL/min (ref >90)
GFR calc non Af Amer: >90 mL/min (ref >90)
Glucose, Bld: 315 mg/dL — ABNORMAL HIGH (ref 70–99)
Potassium: 2.7 mmol/L — CL (ref 3.5–5.1)
Sodium: 139 mmol/L (ref 135–145)
Total Bilirubin: 0.7 mg/dL (ref 0.3–1.2)
Total Protein: 6.6 g/dL (ref 6.0–8.3)

## 2014-08-13 LAB — BASIC METABOLIC PANEL
Anion gap: 7 (ref 5–15)
BUN: 5 mg/dL — AB (ref 6–23)
CHLORIDE: 100 mmol/L (ref 96–112)
CO2: 29 mmol/L (ref 19–32)
Calcium: 7.9 mg/dL — ABNORMAL LOW (ref 8.4–10.5)
Creatinine, Ser: 0.4 mg/dL — ABNORMAL LOW (ref 0.50–1.10)
GFR calc Af Amer: >90 mL/min (ref >90)
Glucose, Bld: 340 mg/dL — ABNORMAL HIGH (ref 70–99)
SODIUM: 136 mmol/L (ref 135–145)

## 2014-08-13 LAB — URINE MICROSCOPIC-ADD ON

## 2014-08-13 LAB — CBC WITH DIFFERENTIAL/PLATELET
BASOS ABS: 0.1 10*3/uL (ref 0.0–0.1)
Basophils Relative: 1 % (ref 0–1)
EOS PCT: 4 % (ref 0–5)
Eosinophils Absolute: 0.3 10*3/uL (ref 0.0–0.7)
HCT: 24.6 % — ABNORMAL LOW (ref 36.0–46.0)
Hemoglobin: 6.9 g/dL — CL (ref 12.0–15.0)
LYMPHS PCT: 42 % (ref 12–46)
Lymphs Abs: 3 10*3/uL (ref 0.7–4.0)
MCH: 17.3 pg — ABNORMAL LOW (ref 26.0–34.0)
MCHC: 28 g/dL — ABNORMAL LOW (ref 30.0–36.0)
MCV: 61.8 fL — ABNORMAL LOW (ref 78.0–100.0)
MONO ABS: 0.5 10*3/uL (ref 0.1–1.0)
MONOS PCT: 7 % (ref 3–12)
NEUTROS PCT: 46 % (ref 43–77)
Neutro Abs: 3.2 10*3/uL (ref 1.7–7.7)
PLATELETS: 250 10*3/uL (ref 150–400)
RBC: 3.98 MIL/uL (ref 3.87–5.11)
RDW: 19.7 % — AB (ref 11.5–15.5)
WBC: 7.1 10*3/uL (ref 4.0–10.5)

## 2014-08-13 LAB — GLUCOSE, CAPILLARY
GLUCOSE-CAPILLARY: 184 mg/dL — AB (ref 70–99)
Glucose-Capillary: 321 mg/dL — ABNORMAL HIGH (ref 70–99)
Glucose-Capillary: 285 mg/dL — ABNORMAL HIGH (ref 70–99)
Glucose-Capillary: 348 mg/dL — ABNORMAL HIGH (ref 70–99)
Glucose-Capillary: 187 mg/dL — ABNORMAL HIGH (ref 70–99)

## 2014-08-13 LAB — OSMOLALITY, URINE: Osmolality, Ur: 483 mOsm/kg (ref 390–1090)

## 2014-08-13 LAB — URINALYSIS, ROUTINE W REFLEX MICROSCOPIC
BILIRUBIN URINE: NEGATIVE
Glucose, UA: >1000 mg/dL — AB
Hgb urine dipstick: NEGATIVE
KETONES UR: NEGATIVE mg/dL
LEUKOCYTES UA: NEGATIVE
NITRITE: NEGATIVE
PROTEIN: NEGATIVE mg/dL
Specific Gravity, Urine: 1.028 (ref 1.005–1.030)
Urobilinogen, UA: 1 mg/dL (ref 0.0–1.0)
pH: 6.5 (ref 5.0–8.0)

## 2014-08-13 LAB — FERRITIN: Ferritin: 5 ng/mL — ABNORMAL LOW (ref 10–291)

## 2014-08-13 LAB — RETICULOCYTES
RBC.: 3.89 MIL/uL (ref 3.87–5.11)
RETIC COUNT ABSOLUTE: 50.6 10*3/uL (ref 19.0–186.0)
Retic Ct Pct: 1.3 % (ref 0.4–3.1)

## 2014-08-13 LAB — MAGNESIUM
MAGNESIUM: 1.9 mg/dL (ref 1.5–2.5)
Magnesium: 1.8 mg/dL (ref 1.5–2.5)

## 2014-08-13 LAB — HEMOGLOBIN AND HEMATOCRIT, BLOOD
HCT: 31.5 % — ABNORMAL LOW (ref 36.0–46.0)
HEMOGLOBIN: 9.3 g/dL — AB (ref 12.0–15.0)

## 2014-08-13 LAB — IRON AND TIBC
IRON: 10 ug/dL — AB (ref 42–145)
SATURATION RATIOS: 3 % — AB (ref 20–55)
TIBC: 373 ug/dL (ref 250–470)
UIBC: 363 ug/dL (ref 125–400)

## 2014-08-13 LAB — FOLATE: Folate: 13 ng/mL

## 2014-08-13 LAB — PREPARE RBC (CROSSMATCH)

## 2014-08-13 LAB — ABO/RH: ABO/RH(D): O POS

## 2014-08-13 LAB — PHOSPHORUS: PHOSPHORUS: 1.7 mg/dL — AB (ref 2.3–4.6)

## 2014-08-13 LAB — NA AND K (SODIUM & POTASSIUM), RAND UR
Potassium Urine: 8 mmol/L
Sodium, Ur: 137 mmol/L

## 2014-08-13 LAB — VITAMIN B12: VITAMIN B 12: 259 pg/mL (ref 211–911)

## 2014-08-13 LAB — TSH: TSH: 2.128 u[IU]/mL (ref 0.350–4.500)

## 2014-08-13 LAB — CREATININE, URINE, RANDOM: Creatinine, Urine: 21.61 mg/dL

## 2014-08-13 MED ORDER — POTASSIUM CHLORIDE CRYS ER 20 MEQ PO TBCR
40.0000 meq | EXTENDED_RELEASE_TABLET | ORAL | Status: AC
  Administered 2014-08-13 (×2): 40 meq via ORAL
  Filled 2014-08-13 (×2): qty 2

## 2014-08-13 MED ORDER — ONDANSETRON HCL 4 MG PO TABS: 4.0000 mg | ORAL_TABLET | Freq: Four times a day (QID) | ORAL | Status: DC | PRN

## 2014-08-13 MED ORDER — ASPIRIN 81 MG PO CHEW
81.0000 mg | CHEWABLE_TABLET | Freq: Every day | ORAL | Status: DC
  Administered 2014-08-13: 81 mg via ORAL
  Filled 2014-08-13: qty 1

## 2014-08-13 MED ORDER — POTASSIUM CHLORIDE IN NACL 20-0.9 MEQ/L-% IV SOLN
INTRAVENOUS | Status: DC
  Administered 2014-08-13: 06:00:00 via INTRAVENOUS
  Filled 2014-08-13 (×2): qty 1000

## 2014-08-13 MED ORDER — GLUCERNA SHAKE PO LIQD
237.0000 mL | Freq: Three times a day (TID) | ORAL | Status: DC
  Administered 2014-08-13 (×3): 237 mL via ORAL

## 2014-08-13 MED ORDER — POTASSIUM CHLORIDE CRYS ER 20 MEQ PO TBCR
40.0000 meq | EXTENDED_RELEASE_TABLET | Freq: Once | ORAL | Status: AC
  Administered 2014-08-13: 40 meq via ORAL
  Filled 2014-08-13: qty 2

## 2014-08-13 MED ORDER — ACETAMINOPHEN 325 MG PO TABS: 650.0000 mg | ORAL_TABLET | Freq: Four times a day (QID) | ORAL | Status: DC | PRN

## 2014-08-13 MED ORDER — CALCIUM CARBONATE ANTACID 500 MG PO CHEW
1.0000 | CHEWABLE_TABLET | Freq: Once | ORAL | Status: AC
  Administered 2014-08-13: 200 mg via ORAL

## 2014-08-13 MED ORDER — GABAPENTIN 300 MG PO CAPS
300.0000 mg | ORAL_CAPSULE | Freq: Three times a day (TID) | ORAL | Status: DC
  Administered 2014-08-13: 300 mg via ORAL
  Filled 2014-08-13 (×7): qty 1

## 2014-08-13 MED ORDER — POTASSIUM CHLORIDE 10 MEQ/100ML IV SOLN: 10.0000 meq | INTRAVENOUS | Status: DC

## 2014-08-13 MED ORDER — SODIUM CHLORIDE 0.9 % IV SOLN
Freq: Once | INTRAVENOUS | Status: AC
  Administered 2014-08-13: 12:00:00 via INTRAVENOUS

## 2014-08-13 MED ORDER — ATORVASTATIN CALCIUM 10 MG PO TABS
10.0000 mg | ORAL_TABLET | Freq: Every day | ORAL | Status: DC
  Administered 2014-08-13: 10 mg via ORAL
  Filled 2014-08-13 (×2): qty 1

## 2014-08-13 MED ORDER — ENOXAPARIN SODIUM 30 MG/0.3ML ~~LOC~~ SOLN
30.0000 mg | SUBCUTANEOUS | Status: DC
  Administered 2014-08-13: 30 mg via SUBCUTANEOUS
  Filled 2014-08-13: qty 0.3

## 2014-08-13 MED ORDER — LEVOTHYROXINE SODIUM 125 MCG PO TABS
125.0000 ug | ORAL_TABLET | Freq: Every day | ORAL | Status: DC
Start: 2014-08-13 — End: 2014-08-14
  Administered 2014-08-13 – 2014-08-14 (×2): 125 ug via ORAL
  Filled 2014-08-13 (×3): qty 1

## 2014-08-13 MED ORDER — KETOROLAC TROMETHAMINE 15 MG/ML IJ SOLN
15.0000 mg | Freq: Four times a day (QID) | INTRAMUSCULAR | Status: DC | PRN
  Administered 2014-08-13 (×2): 15 mg via INTRAVENOUS
  Filled 2014-08-13 (×2): qty 1

## 2014-08-13 MED ORDER — INSULIN ASPART 100 UNIT/ML ~~LOC~~ SOLN
0.0000 [IU] | Freq: Three times a day (TID) | SUBCUTANEOUS | Status: DC
  Administered 2014-08-13: 3 [IU] via SUBCUTANEOUS
  Administered 2014-08-13: 11 [IU] via SUBCUTANEOUS
  Administered 2014-08-13: 8 [IU] via SUBCUTANEOUS
  Administered 2014-08-14: 3 [IU] via SUBCUTANEOUS

## 2014-08-13 MED ORDER — POTASSIUM CHLORIDE 10 MEQ/100ML IV SOLN
10.0000 meq | INTRAVENOUS | Status: AC
  Administered 2014-08-13 (×4): 10 meq via INTRAVENOUS
  Filled 2014-08-13 (×4): qty 100

## 2014-08-13 MED ORDER — INSULIN GLARGINE 100 UNIT/ML ~~LOC~~ SOLN
65.0000 [IU] | Freq: Every day | SUBCUTANEOUS | Status: DC
  Filled 2014-08-13: qty 0.65

## 2014-08-13 MED ORDER — INSULIN GLARGINE 100 UNIT/ML ~~LOC~~ SOLN
75.0000 [IU] | Freq: Every day | SUBCUTANEOUS | Status: DC
  Administered 2014-08-13: 75 [IU] via SUBCUTANEOUS
  Filled 2014-08-13 (×2): qty 0.75

## 2014-08-13 MED ORDER — MAGNESIUM SULFATE 2 GM/50ML IV SOLN
2.0000 g | Freq: Once | INTRAVENOUS | Status: AC
  Administered 2014-08-13: 2 g via INTRAVENOUS
  Filled 2014-08-13: qty 50

## 2014-08-13 MED ORDER — INSULIN ASPART 100 UNIT/ML ~~LOC~~ SOLN
0.0000 [IU] | Freq: Every day | SUBCUTANEOUS | Status: DC
Start: 2014-08-13 — End: 2014-08-14

## 2014-08-13 MED ORDER — FUROSEMIDE 10 MG/ML IJ SOLN
10.0000 mg | Freq: Once | INTRAMUSCULAR | Status: AC
  Administered 2014-08-13: 10 mg via INTRAVENOUS
  Filled 2014-08-13: qty 2

## 2014-08-13 MED ORDER — CLOBETASOL PROPIONATE 0.05 % EX CREA
1.0000 "application " | TOPICAL_CREAM | Freq: Two times a day (BID) | CUTANEOUS | Status: DC
  Administered 2014-08-13 (×2): 1 via TOPICAL
  Filled 2014-08-13: qty 15

## 2014-08-13 MED ORDER — CALCIUM CARBONATE ANTACID 500 MG PO CHEW
CHEWABLE_TABLET | ORAL | Status: AC
  Administered 2014-08-13: 200 mg via ORAL
  Filled 2014-08-13: qty 2

## 2014-08-13 MED ORDER — ACETAMINOPHEN 650 MG RE SUPP
650.0000 mg | Freq: Four times a day (QID) | RECTAL | Status: DC | PRN
Start: 2014-08-13 — End: 2014-08-14

## 2014-08-13 MED ORDER — ONDANSETRON HCL 4 MG/2ML IJ SOLN
4.0000 mg | Freq: Four times a day (QID) | INTRAMUSCULAR | Status: DC | PRN
  Administered 2014-08-13: 4 mg via INTRAVENOUS

## 2014-08-13 MED ORDER — SODIUM CHLORIDE 0.9 % IV SOLN
INTRAVENOUS | Status: DC
  Administered 2014-08-13 (×2): via INTRAVENOUS

## 2014-08-13 MED ORDER — POTASSIUM CHLORIDE 10 MEQ/100ML IV SOLN
10.0000 meq | INTRAVENOUS | Status: AC
  Administered 2014-08-13 (×6): 10 meq via INTRAVENOUS
  Filled 2014-08-13 (×5): qty 100

## 2014-08-13 MED ORDER — METOPROLOL SUCCINATE ER 25 MG PO TB24
25.0000 mg | ORAL_TABLET | Freq: Every day | ORAL | Status: DC
  Administered 2014-08-13: 25 mg via ORAL
  Filled 2014-08-13 (×2): qty 1

## 2014-08-13 MED ORDER — ONDANSETRON HCL 4 MG/2ML IJ SOLN
INTRAMUSCULAR | Status: AC
  Administered 2014-08-13: 4 mg via INTRAVENOUS
  Filled 2014-08-13: qty 2

## 2014-08-13 MED ORDER — SODIUM CHLORIDE 0.9 % IJ SOLN
3.0000 mL | Freq: Two times a day (BID) | INTRAMUSCULAR | Status: DC
  Administered 2014-08-13 (×2): 3 mL via INTRAVENOUS

## 2014-08-13 MED ORDER — DIPHENHYDRAMINE HCL 50 MG/ML IJ SOLN: 25.0000 mg | Freq: Four times a day (QID) | INTRAMUSCULAR | Status: DC | PRN

## 2014-08-13 NOTE — Progress Notes (Signed)
Initial Nutrition Assessment  DOCUMENTATION CODES:  Non-severe (moderate) malnutrition in context of chronic illness  INTERVENTION:  Glucerna shake TID, each supplement provides 220 kcal and 10 grams of protein   NUTRITION DIAGNOSIS:  Malnutrition related to chronic illness as evidenced by percent weight loss, mild depletion of muscle mass.   GOAL:  Patient will meet greater than or equal to 90% of their needs   MONITOR:  PO intake, Supplement acceptance, Weight trends, Labs  REASON FOR ASSESSMENT:  Malnutrition Screening Tool    ASSESSMENT:  Patient admitted on 4/27 with vaginal pain, one week of diarrhea, 20+ lb weight loss in the past few months. Work-up ongoing.  Patient reports that she has been eating very poorly for the past several months because she lost her appetite. She is feeling a lot better since she has received fluids and potassium supplementation today. Intake improving. She agreed to try Glucerna Shakes to maximize protein and calorie intake. Patient reports minimal intake of meats because she doesn't have some of her teeth. Discussed ways to increase protein intake with meals to help control blood glucose. Discussed appropriate diet for diabetes.   Height:  Ht Readings from Last 1 Encounters:  08/13/14 4\' 11"  (1.499 m)    Weight:  Wt Readings from Last 1 Encounters:  08/13/14 114 lb 10.2 oz (52 kg)    Ideal Body Weight:  44.5 kg  Wt Readings from Last 10 Encounters:  08/13/14 114 lb 10.2 oz (52 kg)  01/04/14 120 lb (54.432 kg)  12/31/12 140 lb (63.504 kg)  06/08/12 140 lb (63.504 kg)  11/13/11 140 lb (63.504 kg)  03/04/11 135 lb (61.236 kg)    BMI:  Body mass index is 23.14 kg/(m^2).  Estimated Nutritional Needs:  Kcal:  1400-1600 kcals  Protein:  65-75 gm  Fluid:  1.5 L  Skin:  Reviewed, no issues  Diet Order:  Diet regular Room service appropriate?: Yes; Fluid consistency:: Thin  EDUCATION NEEDS:  Education needs  addressed   Intake/Output Summary (Last 24 hours) at 08/13/14 1034 Last data filed at 08/13/14 0724  Gross per 24 hour  Intake 968.33 ml  Output    350 ml  Net 618.33 ml    Last BM:  4/27   Molli Barrows, RD, LDN, [place] Pager [phone] After Hours Pager [phone]

## 2014-08-13 NOTE — Progress Notes (Signed)
Inpatient Diabetes Program Recommendations  AACE/ADA: New Consensus Statement on Inpatient Glycemic Control (2013)  Target Ranges:  Prepandial:   less than 140 mg/dL      Peak postprandial:   less than 180 mg/dL (1-2 hours)      Critically ill patients:  140 - 180 mg/dL   Results for ALLSION, NOGALES (MRN [id number]) as of 08/13/2014 11:46  Ref. Range 08/12/2014 22:59 08/13/2014 05:21 08/13/2014 08:38  Glucose-Capillary Latest Ref Range: 70-99 mg/dL 352 (H) 321 (H) 285 (H)   Outpatient Diabetes medications: Lantus 65 units QHS, Metformin 500 mg QAM Current orders for Inpatient glycemic control: Lantus 65 units QHS, Novolog 0-15 units TID with meals, Novolog 0-5 units HS  Inpatient Diabetes Program Recommendations HgbA1C: Noted A1C has been ordered. Due to low hemoglobin, A1C will not be reliable in reflecting glycemic control over the past 2-3 months. Diet: Please consider changing diet from Regular to Carb Modified.  Note: Spoke with patient about diabetes and home regimen for diabetes control. Patient reports that she is followed by doctor at [facility] for diabetes management and currently she takes Lantus 65 units QHS and Metformin 500 mg QAM as an outpatient for diabetes control. Patient states that her blood glucose has been running high for quite a while. According to the patient she checks her glucose 1-2 times per day (after lunch and sometimes at bedtime) and it runs 200 mg/dl and higher.  Inquired about knowledge about A1C and patient reports that she knows what an A1C is but she does not recall exactly what last A1C was but states that it was "high". Patient states that her last visit with the [facility] was in December or January.  Reviewed what an A1C is and importance of checking CBGs and maintaining good CBG control to prevent long-term and short-term complications. Discussed impact of nutrition, exercise, stress, sickness, and medications on diabetes control.   Patient has recently been on steroids which has impacted glycemic control. According to the patient she has continued to take Lantus 65 units despite losing 20 pounds over the past few months. Patient denies having any episodes of hypoglycemia. Encouraged patient to check glucose 3-4 times per day (before meals and at bedtime) and explained how her doctors can use the information to make additional adjustments with her DM medications based on glucose trends. Patient verbalized understanding of information discussed and she states that she has no further questions at this time related to diabetes. Will continue to follow as an inpatient and make recommendations as more data is collected.  Thanks, Barnie Alderman, RN, MSN, CCRN, CDE Diabetes Coordinator Inpatient Diabetes Program [phone] (Team Pager) [phone] ([facility] office) [phone] [facility] office)

## 2014-08-13 NOTE — H&P (Signed)
[facility] History and Physical  Patient: Laura Oconnor  MRN: [id number]  DOB: [birth year]  DOS: the patient was seen and examined on 08/13/2014 PCP: PARKS,RANDAL A, PA-C patient follows up at [place]   Referring physician: Dr. [name] Chief Complaint: Vaginal pain  HPI: Laura Oconnor is a 59 y.o. female with Past medical history of hypertension, diabetes, coronary artery disease, hypothyroidism, vaginal pruritus. The patient is presenting with complaints of vaginal pain. On further questioning she also complained that she has one week of diarrhea with sticky mucousy stool 2-3 times a day. She has significant loss of appetite and has lost 20+ pound weight in the last few months. She denies any nausea vomiting or abdominal pain or acid reflux. Since last 4-5 months she has been having progressively worsening vaginal itching for which she has seen internal medicine, OB/GYN, dermatology and is using multiple creams. She also has undergone biopsy which did not show any acute abnormality. OB/GYN at [place] suspect this is lichen. Patient has not seen any gastroenterologist and does not have any colonoscopy.  The patient is coming from home. And at her baseline independent for most of her ADL.  Review of Systems: as mentioned in the history of present illness.  A comprehensive review of the other systems is negative.  Past Medical History  Diagnosis Date  . Hypertension   . Diabetes mellitus   . History of right coronary artery stent placement     2 stents placed at separate times  . Thyroid disease    Past Surgical History  Procedure Laterality Date  . Abdominal hysterectomy     Social History:  reports that she has been smoking Cigarettes.  She has been smoking about 0.50 packs per day. She has never used smokeless tobacco. She reports that she does not drink alcohol or use illicit drugs.  Allergies  Allergen Reactions  . Amoxicillin   . Morphine  And Related     "all of them make me throw up and pass out"  . Percocet [Oxycodone-Acetaminophen] Nausea And Vomiting  . Percodan [Oxycodone-Aspirin]     Family History  Problem Relation Age of Onset  . Diabetes Father   . Diabetes Sister   . Diabetes Brother     Prior to Admission medications   Medication Sig Start Date End Date Taking? Authorizing Provider  aspirin 81 MG chewable tablet Chew 81 mg by mouth daily.     Yes Historical Provider, MD  atorvastatin (LIPITOR) 10 MG tablet Take 10 mg by mouth daily.     Yes Historical Provider, MD  clobetasol cream (TEMOVATE) 5.40 % Apply 1 application topically 2 (two) times daily.   Yes Historical Provider, MD  gabapentin (NEURONTIN) 300 MG capsule Take 300 mg by mouth 3 (three) times daily.   Yes Historical Provider, MD  insulin glargine (LANTUS) 100 UNIT/ML injection Inject 65 Units into the skin at bedtime.    Yes Historical Provider, MD  levothyroxine (SYNTHROID, LEVOTHROID) 125 MCG tablet Take 125 mcg by mouth daily.     Yes Historical Provider, MD  metFORMIN (GLUCOPHAGE) 500 MG tablet Take 500 mg by mouth daily with breakfast.   Yes Historical Provider, MD  metoprolol succinate (TOPROL-XL) 25 MG 24 hr tablet Take 25 mg by mouth daily. Patient took this medication yesterday between 12:30 pm- 1:00 pm.   Yes Historical Provider, MD  triamcinolone (KENALOG) 0.025 % cream Apply 1 application topically 2 (two) times daily.   Yes Historical  Provider, MD  Spacer/Aero-Holding Chambers (AEROCHAMBER PLUS WITH MASK) inhaler Use as instructed 04/22/12   Riki Altes, MD    Physical Exam: Filed Vitals:   08/13/14 0230 08/13/14 0330 08/13/14 0345 08/13/14 0441  BP: 163/60 166/63 167/61 177/65  Pulse: 81 81 84 104  Temp:    98.9 F (37.2 C)  TempSrc:    Oral  Resp: 15 15 18 15   Height:    4\' 11"  (1.499 m)  Weight:    52 kg (114 lb 10.2 oz)  SpO2: 100% 97% 97% 100%    General: Alert, Awake and Oriented to Time, Place and Person. Appear in mild  distress Eyes: PERRL ENT: Oral Mucosa clear moist. Neck: no JVD Cardiovascular: S1 and S2 Present, no Murmur, Peripheral Pulses Present Respiratory: Bilateral Air entry equal and Decreased,  Clear to Auscultation, no Crackles, no wheezes Abdomen: Bowel Sound present, Soft and no tender Skin: Vaginal examination shows atrophy of the skin without any Rash Extremities: no Pedal edema, no calf tenderness Neurologic: Grossly no focal neuro deficit.  Labs on Admission:  CBC: No results for input(s): WBC, NEUTROABS, HGB, HCT, MCV, PLT in the last 168 hours.  CMP     Component Value Date/Time   NA 136 08/13/2014 0049   K <2.0* 08/13/2014 0049   CL 100 08/13/2014 0049   CO2 29 08/13/2014 0049   GLUCOSE 340* 08/13/2014 0049   BUN 5* 08/13/2014 0049   CREATININE 0.40* 08/13/2014 0049   CALCIUM 7.9* 08/13/2014 0049   GFRNONAA >90 08/13/2014 0049   GFRAA >90 08/13/2014 0049    No results for input(s): LIPASE, AMYLASE in the last 168 hours.  No results for input(s): CKTOTAL, CKMB, CKMBINDEX, TROPONINI in the last 168 hours. BNP (last 3 results) No results for input(s): BNP in the last 8760 hours.  ProBNP (last 3 results) No results for input(s): PROBNP in the last 8760 hours.   Radiological Exams on Admission: Dg Chest 2 View  08/13/2014   CLINICAL DATA:  Acute onset of hypokalemia and weakness. Diarrhea. Hypoglycemia. Initial encounter.  EXAM: CHEST  2 VIEW  COMPARISON:  Chest radiograph performed 01/04/2014  FINDINGS: The lungs are well-aerated and clear. There is no evidence of focal opacification, pleural effusion or pneumothorax.  The heart is borderline normal in size. No acute osseous abnormalities are seen.  IMPRESSION: No acute cardiopulmonary process seen.   Electronically Signed   By: Garald Balding M.D.   On: 08/13/2014 02:19   EKG: Independently reviewed. normal EKG, normal sinus rhythm.  Assessment/Plan Principal Problem:   Hypokalemia Active Problems:   Diarrhea    Loss of weight   Hypocalcemia   Atrophic vaginitis   Type 2 diabetes mellitus, uncontrolled   Hypothyroidism   CAD (coronary artery disease)   1. Hypokalemia The patient is presenting with complaints of diarrhea and vaginal pain. Further history mentions she has significantly loss of weight as well as poor appetite as well as diarrhea. At present she is found to be having significantly low potassium. She has a similar connective potassium and will continue to replace her potassium and recheck a potassium level. She is currently admitted in stepdown unit. We will obtain further workup identified etiology of the low potassium levels.  2. Vaginal pain. Examination does not show any evidence of acute infection or acute abnormality. This appears atrophy was an enteritis. She has undergone extensive workup as an outpatient. I agree with continuation off of steroid creams. Diarrhea, loss of weight, poor appetite. The  patient is presenting with loss of weight or appetite as well as diarrhea. Check her for C. difficile. We will recheck her labs. In that is in a significant abnormality and she continues to have diarrhea in the hospital GI may be required to be seeing the patient to further identify the cause for her significant weight loss.  3. Diabetes mellitus uncontrolled. Last hemoglobin A1c was 11 in September. We will recheck hemoglobin A1c. Continue home insulin. Placing her on sliding scale.  4.History of hypothyroidism. Checking TSH. Continue Synthroid.  5. Essential hypertension. Continue home medications.  Advance goals of care discussion: Full code   DVT Prophylaxis: subcutaneous Heparin Nutrition: Regular diet  Family Communication: family was present at bedside, opportunity was given to ask question and all questions were answered satisfactorily at the time of interview. Disposition: Admitted as inpatient, step-down unit.  Author: Berle Mull, MD [facility] Pager: [phone] 08/13/2014  If 7PM-7AM, please contact night-coverage [email or web address] Password [email or web address]

## 2014-08-13 NOTE — Progress Notes (Signed)
Inpatient Diabetes Program Recommendations  AACE/ADA: New Consensus Statement on Inpatient Glycemic Control (2013)  Target Ranges:  Prepandial:   less than 140 mg/dL      Peak postprandial:   less than 180 mg/dL (1-2 hours)      Critically ill patients:  140 - 180 mg/dL   Inpatient Diabetes Program Recommendations Insulin - Basal: Noted lantus doe incresed from prior dose ordered at 65 units to 75 units.. No basal had been started yet, therefore will need to follow for any potential low. HgbA1C: Noted A1C has been ordered. Due to low hemoglobin, A1C will likely not be accurate. Diet: Please consider changing diet from Regular to Carb Modified.  Will follow Thank you Rosita Kea, RN, MSN, CDE  Diabetes Inpatient Program Office: [phone] Pager: [phone] 8:00 am to 5:00 pm

## 2014-08-13 NOTE — Progress Notes (Signed)
CRITICAL VALUE ALERT  Critical value received:  Hg 6.9  Date of notification:  08/13/2014  Time of notification:  0748  Critical value read back:yes  Nurse who received alert:  Austin Miles  MD notified (1st page):  Singh,P  Time of first page:  0750  MD notified (2nd page):  Time of second page:  Responding MD:  Ronnie Derby  Time MD responded:  [phone]

## 2014-08-13 NOTE — ED Notes (Signed)
MD at bedside. 

## 2014-08-13 NOTE — Progress Notes (Signed)
Patient Demographics  Laura Oconnor, is a 59 y.o. female, DOB - 14-Apr-1956, ZOX:[id number]  Admit date - 08/12/2014   Admitting Physician Ron Parker, MD  Outpatient Primary MD for the patient is PARKS,RANDAL A, PA-C  LOS - 0   Chief Complaint  Patient presents with  . Recurrent Skin Infections        Subjective:   Laura Oconnor today has, No headache, No chest pain, No abdominal pain - No Nausea, No new weakness tingling or numbness, No Cough - SOB. Some intermittent vagina itching.  Assessment & Plan    1. Vaginal itching. 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 She is on oral steroids along with topical creams which will be continued. Follow with her primary OB and dermatology post discharge. No dermatology here available.   2. Anemia. Denies any black-colored stool or blood in stool. Anemia panel ordered, likely anemia of chronic disease. We will transfuse 2 units of packed RBC on 08/13/2014 and monitor.   3. Diarrhea induced severe hypokalemia. Potassium replaced. C. difficile pending. Supportive care.   4. DM type II. In poor control due to being on prednisone for #1 above. Lantus continue, sliding scale added, will adjust medications before discharge.  No results found for: HGBA1C  CBG (last 3)   Recent Labs  08/12/14 2259 08/13/14 0521 08/13/14 0838  GLUCAP 352* 321* 285*    5. Hypothyroidism. Continue home dose Synthroid.   6. Dyslipidemia. She is on statin continue.   7. CAD. Continue beta blocker, statin, aspirin for secondary prevention of acute issues.    Code Status: Full  Family Communication: None  Disposition Plan: Home   Procedures     Consults       Medications  Scheduled Meds: . aspirin  81 mg Oral Daily  . atorvastatin  10 mg Oral Daily  . clobetasol cream  1 application Topical BID  . feeding supplement (GLUCERNA SHAKE)  237 mL Oral TID BM  . furosemide  10 mg Intravenous Once  . gabapentin  300 mg Oral TID  . insulin aspart  0-15 Units Subcutaneous TID WC  . insulin aspart  0-5 Units Subcutaneous QHS  . insulin glargine  65 Units Subcutaneous QHS  . levothyroxine  125 mcg Oral QAC breakfast  . metoprolol succinate  25 mg Oral Daily  . potassium chloride  10 mEq Intravenous Q1 Hr x 4  . potassium chloride  40 mEq Oral Q4H  . sodium chloride  3 mL Intravenous Q12H   Continuous Infusions: . sodium chloride 75 mL/hr at 08/13/14 0831   PRN Meds:.acetaminophen **OR** acetaminophen, diphenhydrAMINE, ketorolac, ondansetron **OR** ondansetron (ZOFRAN) IV  DVT Prophylaxis   SCDs    Lab Results  Component Value Date   PLT 250 08/13/2014    Antibiotics     Anti-infectives    None          Objective:   Filed Vitals:   08/13/14 1128 08/13/14 1131 08/13/14 1159 08/13/14 1200  BP: 123/46 123/46  137/51  Pulse: 76 74 77 78  Temp:   98.7 F (37.1 C)   TempSrc:   Oral   Resp: 17 18 5  12  Height:      Weight:      SpO2: 100% 100% 100% 99%    Wt Readings from Last 3 Encounters:  08/13/14 52 kg (114 lb 10.2 oz)  01/04/14 54.432 kg (120 lb)  12/31/12 63.504 kg (140 lb)     Intake/Output Summary (Last 24 hours) at 08/13/14 1207 Last data filed at 08/13/14 1128  Gross per 24 hour  Intake 978.33 ml  Output    350 ml  Net 628.33 ml     Physical Exam  Awake Alert, Oriented X 3, No new F.N deficits, Normal affect [place].AT,PERRAL Supple Neck,No JVD, No cervical lymphadenopathy appriciated.  Symmetrical Chest wall movement, Good air movement bilaterally, CTAB RRR,No Gallops,Rubs or new Murmurs, No Parasternal Heave +ve B.Sounds, Abd Soft, No tenderness, No organomegaly appriciated, No rebound - guarding or  rigidity. No Cyanosis, Clubbing or edema, No new Rash or bruise  Vagina exam in the presence of nurse was unremarkable. Minimal erythema on the labial walls. No discharge.   Data Review   Micro Results No results found for this or any previous visit (from the past 240 hour(s)).  Radiology Reports Dg Chest 2 View  08/13/2014   CLINICAL DATA:  Acute onset of hypokalemia and weakness. Diarrhea. Hypoglycemia. Initial encounter.  EXAM: CHEST  2 VIEW  COMPARISON:  Chest radiograph performed 01/04/2014  FINDINGS: The lungs are well-aerated and clear. There is no evidence of focal opacification, pleural effusion or pneumothorax.  The heart is borderline normal in size. No acute osseous abnormalities are seen.  IMPRESSION: No acute cardiopulmonary process seen.   Electronically Signed   By: Roanna Raider M.D.   On: 08/13/2014 02:19     CBC  Recent Labs Lab 08/13/14 0638  WBC 7.1  HGB 6.9*  HCT 24.6*  PLT 250  MCV 61.8*  MCH 17.3*  MCHC 28.0*  RDW 19.7*  LYMPHSABS 3.0  MONOABS 0.5  EOSABS 0.3  BASOSABS 0.1    Chemistries   Recent Labs Lab 08/13/14 0049 08/13/14 0638  NA 136 139  K <2.0* 2.7*  CL 100 102  CO2 29 30  GLUCOSE 340* 315*  BUN 5* <5*  CREATININE 0.40* 0.44*  CALCIUM 7.9* 8.2*  MG 1.9 1.8  AST  --  12  ALT  --  8  ALKPHOS  --  122*  BILITOT  --  0.7   ------------------------------------------------------------------------------------------------------------------ estimated creatinine clearance is 56.5 mL/min (by C-G formula based on Cr of 0.44). ------------------------------------------------------------------------------------------------------------------ No results for input(s): HGBA1C in the last 72 hours. ------------------------------------------------------------------------------------------------------------------ No results for input(s): CHOL, HDL, LDLCALC, TRIG, CHOLHDL, LDLDIRECT in the last 72  hours. ------------------------------------------------------------------------------------------------------------------  Recent Labs  08/13/14 0638  TSH 2.128   ------------------------------------------------------------------------------------------------------------------  Recent Labs  08/13/14 0855  RETICCTPCT 1.3    Coagulation profile No results for input(s): INR, PROTIME in the last 168 hours.  No results for input(s): DDIMER in the last 72 hours.  Cardiac Enzymes No results for input(s): CKMB, TROPONINI, MYOGLOBIN in the last 168 hours.  Invalid input(s): CK ------------------------------------------------------------------------------------------------------------------ Invalid input(s): POCBNP     Time Spent in minutes 35   Markeis Allman K M.D on 08/13/2014 at 12:07 PM  Between 7am to 7pm - Pager - [phone]  After 7pm go to [email or web address] - password [facility]  [facility]   Office  [phone]

## 2014-08-13 NOTE — ED Notes (Signed)
Patient transported to X-ray 

## 2014-08-14 LAB — TYPE AND SCREEN
ABO/RH(D): O POS
Antibody Screen: NEGATIVE
UNIT DIVISION: 0
Unit division: 0

## 2014-08-14 LAB — GLUCOSE, CAPILLARY: Glucose-Capillary: 190 mg/dL — ABNORMAL HIGH (ref 70–99)

## 2014-08-14 LAB — HEMOGLOBIN AND HEMATOCRIT, BLOOD
HEMATOCRIT: 31.3 % — AB (ref 36.0–46.0)
HEMOGLOBIN: 9.2 g/dL — AB (ref 12.0–15.0)

## 2014-08-14 LAB — HEMOGLOBIN A1C
Hgb A1c MFr Bld: 11.6 % — ABNORMAL HIGH (ref 4.8–5.6)
MEAN PLASMA GLUCOSE: 286 mg/dL

## 2014-08-14 LAB — MAGNESIUM: Magnesium: 2.1 mg/dL (ref 1.5–2.5)

## 2014-08-14 LAB — POTASSIUM: Potassium: 3.1 mmol/L — ABNORMAL LOW (ref 3.5–5.1)

## 2014-08-14 MED ORDER — INSULIN GLARGINE 100 UNIT/ML ~~LOC~~ SOLN: 75.0000 [IU] | Freq: Every day | SUBCUTANEOUS | Status: DC

## 2014-08-14 MED ORDER — FERROUS SULFATE 325 (65 FE) MG PO TABS: 325.0000 mg | ORAL_TABLET | Freq: Two times a day (BID) | ORAL | Status: AC

## 2014-08-14 MED ORDER — POTASSIUM CHLORIDE CRYS ER 20 MEQ PO TBCR
40.0000 meq | EXTENDED_RELEASE_TABLET | ORAL | Status: DC
  Administered 2014-08-14: 40 meq via ORAL
  Filled 2014-08-14: qty 2

## 2014-08-14 NOTE — Discharge Summary (Signed)
Laura Oconnor, is a 59 y.o. female  DOB 1955-10-12  MRN [id number].  Admission date:  08/12/2014  Admitting Physician  Theressa Millard, MD  Discharge Date:  08/14/2014   Primary MD  Velda Shell A, PA-C  Recommendations for primary care physician for things to follow:   Needs close outpatient follow-up with dermatology in OB. Monitor CBC and iron panel closely  Outpatient iron deficiency anemia workup, refused to stay in the hospital any further for any other workup   Admission Diagnosis  Hypokalemia [E87.6]   Discharge Diagnosis  Hypokalemia [E87.6]     Principal Problem:   Hypokalemia Active Problems:   Diarrhea   Loss of weight   Hypocalcemia   Atrophic vaginitis   Type 2 diabetes mellitus, uncontrolled   Hypothyroidism   CAD (coronary artery disease)      Past Medical History  Diagnosis Date  . Hypertension   . Diabetes mellitus   . History of right coronary artery stent placement     2 stents placed at separate times  . Thyroid disease     Past Surgical History  Procedure Laterality Date  . Abdominal hysterectomy         History of present illness and  Hospital Course:     Kindly see H&P for history of present illness and admission details, please review complete Labs, Consult reports and Test reports for all details in brief  HPI  from the history and physical done on the day of admission  Laura Oconnor is a 59 y.o. female with Past medical history of hypertension, diabetes, coronary artery disease, hypothyroidism, vaginal pruritus. The patient is presenting with complaints of vaginal pain. On further questioning she also complained that she has one week of diarrhea with sticky mucousy stool 2-3 times a day. She has significant loss of appetite and has lost 20+ pound weight in the  last few months.She denies any nausea vomiting or abdominal pain or acid reflux. Since last 4-5 months she has been having progressively worsening vaginal itching for which she has seen internal medicine, OB/GYN, dermatology and is using multiple creams. She also has undergone biopsy which did not show any acute abnormality. OB/GYN at [place] suspect this is lichen. Patient has not seen any gastroenterologist and does not have any colonoscopy.   Hospital Course    1. Vaginal itching. 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 She is on oral steroids along with topical creams which will be continued. Follow with her primary OB and dermatology post discharge. Says feels better and wants to be discharged immidiately.   2. Anemia. Denies any black-colored stool or blood in stool. Anemia panel ordered, likely anemia of chronic disease. H&H stable post 2 units of packed RBC on 08/13/2014 , [place]i a panel suggested iron deficiency and was placed on oral iron. We'll request PCP to do outpatient iron deficiency workup as appropriate. She refused to stay in the hospital for  any further workup.   3. Diarrhea induced severe hypokalemia. K replaced, refuses to take further potassium pills, no bowel movements in the last 24 hours.   4. DM type II. In poor control due to being on prednisone for #1 above. Lantus dose increased, continue Glucophage. Requested to do CBGs before meals at bedtime request PCP to monitor glycemic control closely. She refused stay any further for further medication adjustment or titration. Wanted to be discharged immediately this morning and would sign out AMA.   Lab Results  Component Value Date   HGBA1C 11.6* 08/13/2014            5. Hypothyroidism. Continue home dose Synthroid.   6. Dyslipidemia. She is on statin continue.   7. CAD. Continue beta blocker, statin, aspirin for  secondary prevention of acute issues        Discharge Condition: Stable   Follow UP  Follow-up Information    Follow up with PARKS,RANDAL A, PA-C. Schedule an appointment as soon as possible for a visit in 1 week.   Specialty:  Physician Assistant   Why:  your OB and Dermatology MD   Contact information:   [place] [address] [phone]         Discharge Instructions  and  Discharge Medications      Discharge Instructions    Discharge instructions    Complete by:  As directed   Follow with Primary MD PARKS,RANDAL A, PA-C in 7 days   Get CBC, CMP, 2 view Chest X ray checked  by Primary MD next visit.    Activity: As tolerated with Full fall precautions use walker/cane & assistance as needed   Disposition Home    Diet: Heart Healthy Low Carb  Accuchecks 4 times/day, Once in AM empty stomach and then before each meal. Log in all results and show them to your Prim.MD in 3 days. If any glucose reading is under 80 or above 300 call your Prim MD immidiately. Follow Low glucose instructions for glucose under 80 as instructed.   For Heart failure patients - Check your Weight same time everyday, if you gain over 2 pounds, or you develop in leg swelling, experience more shortness of breath or chest pain, call your Primary MD immediately. Follow Cardiac Low Salt Diet and 1.5 lit/day fluid restriction.   On your next visit with your primary care physician please Get Medicines reviewed and adjusted.   Please request your Prim.MD to go over all Hospital Tests and Procedure/Radiological results at the follow up, please get all Hospital records sent to your Prim MD by signing hospital release before you go home.   If you experience worsening of your admission symptoms, develop shortness of breath, life threatening emergency, suicidal or homicidal thoughts you must seek medical attention immediately by calling 911 or calling your MD immediately  if  symptoms less severe.  You Must read complete instructions/literature along with all the possible adverse reactions/side effects for all the Medicines you take and that have been prescribed to you. Take any new Medicines after you have completely understood and accpet all the possible adverse reactions/side effects.   Do not drive, operating heavy machinery, perform activities at heights, swimming or participation in water activities or provide baby sitting services if your were admitted for syncope or siezures until you have seen by Primary MD or a Neurologist and advised to do so again.  Do not drive when taking Pain medications.    Do not  take more than prescribed Pain, Sleep and Anxiety Medications  Special Instructions: If you have smoked or chewed Tobacco  in the last 2 yrs please stop smoking, stop any regular Alcohol  and or any Recreational drug use.  Wear Seat belts while driving.   Please note  You were cared for by a hospitalist during your hospital stay. If you have any questions about your discharge medications or the care you received while you were in the hospital after you are discharged, you can call the unit and asked to speak with the hospitalist on call if the hospitalist that took care of you is not available. Once you are discharged, your primary care physician will handle any further medical issues. Please note that NO REFILLS for any discharge medications will be authorized once you are discharged, as it is imperative that you return to your primary care physician (or establish a relationship with a primary care physician if you do not have one) for your aftercare needs so that they can reassess your need for medications and monitor your lab values.     Increase activity slowly    Complete by:  As directed             Medication List    TAKE these medications        aerochamber plus with mask inhaler  Use as instructed     aspirin 81 MG chewable tablet  Chew  81 mg by mouth daily.     atorvastatin 10 MG tablet  Commonly known as:  LIPITOR  Take 10 mg by mouth daily.     clobetasol cream 0.05 %  Commonly known as:  TEMOVATE  Apply 1 application topically 2 (two) times daily.     ferrous sulfate 325 (65 FE) MG tablet  Take 1 tablet (325 mg total) by mouth 2 (two) times daily with a meal.     gabapentin 300 MG capsule  Commonly known as:  NEURONTIN  Take 300 mg by mouth at bedtime.     insulin glargine 100 UNIT/ML injection  Commonly known as:  LANTUS  Inject 0.75 mLs (75 Units total) into the skin at bedtime.     levothyroxine 125 MCG tablet  Commonly known as:  SYNTHROID, LEVOTHROID  Take 125 mcg by mouth at bedtime.     metFORMIN 500 MG tablet  Commonly known as:  GLUCOPHAGE  Take 500 mg by mouth daily with breakfast.     metoprolol succinate 25 MG 24 hr tablet  Commonly known as:  TOPROL-XL  Take 25 mg by mouth at bedtime.     triamcinolone 0.025 % cream  Commonly known as:  KENALOG  Apply 1 application topically 2 (two) times daily.          Diet and Activity recommendation: See Discharge Instructions above   Consults obtained - None   Major procedures and Radiology Reports - PLEASE review detailed and final reports for all details, in brief -       Dg Chest 2 View  08/13/2014   CLINICAL DATA:  Acute onset of hypokalemia and weakness. Diarrhea. Hypoglycemia. Initial encounter.  EXAM: CHEST  2 VIEW  COMPARISON:  Chest radiograph performed 01/04/2014  FINDINGS: The lungs are well-aerated and clear. There is no evidence of focal opacification, pleural effusion or pneumothorax.  The heart is borderline normal in size. No acute osseous abnormalities are seen.  IMPRESSION: No acute cardiopulmonary process seen.   Electronically Signed   By: Garald Balding  M.D.   On: 08/13/2014 02:19    Micro Results      No results found for this or any previous visit (from the past 240 hour(s)).     Today   Subjective:    Laura Oconnor today has no headache,no chest abdominal pain,no new weakness tingling or numbness, feels much better wants to go home today.   Objective:   Blood pressure 142/56, pulse 76, temperature 98.4 F (36.9 C), temperature source Oral, resp. rate 14, height 4\' 11"  (1.499 m), weight 56.065 kg (123 lb 9.6 oz), SpO2 98 %.   Intake/Output Summary (Last 24 hours) at 08/14/14 0828 Last data filed at 08/14/14 0455  Gross per 24 hour  Intake 2401.25 ml  Output   1100 ml  Net 1301.25 ml    Exam Awake Alert, Oriented x 3, No new F.N deficits, Normal affect [place].AT,PERRAL Supple Neck,No JVD, No cervical lymphadenopathy appriciated.  Symmetrical Chest wall movement, Good air movement bilaterally, CTAB RRR,No Gallops,Rubs or new Murmurs, No Parasternal Heave +ve B.Sounds, Abd Soft, Non tender, No organomegaly appriciated, No rebound -guarding or rigidity. No Cyanosis, Clubbing or edema, No new Rash or bruise  Data Review   CBC w Diff: Lab Results  Component Value Date   WBC 7.1 08/13/2014   HGB 9.2* 08/14/2014   HCT 31.3* 08/14/2014   PLT 250 08/13/2014   LYMPHOPCT 42 08/13/2014   MONOPCT 7 08/13/2014   EOSPCT 4 08/13/2014   BASOPCT 1 08/13/2014    CMP: Lab Results  Component Value Date   NA 139 08/13/2014   K 3.1* 08/14/2014   CL 102 08/13/2014   CO2 30 08/13/2014   BUN <5* 08/13/2014   CREATININE 0.44* 08/13/2014   PROT 6.6 08/13/2014   ALBUMIN 2.8* 08/13/2014   BILITOT 0.7 08/13/2014   ALKPHOS 122* 08/13/2014   AST 12 08/13/2014   ALT 8 08/13/2014  .   Total Time in preparing paper work, data evaluation and todays exam - 35 minutes  Thurnell Lose M.D on 08/14/2014 at 8:28 AM  [facility]   Office  [phone]

## 2014-08-14 NOTE — Progress Notes (Signed)
Pt. Discharge education performed pt. Verbalized understanding, all questions answered at this time.  Pt. VSS with no s/s of distress noted.  Patient stable at discharge.

## 2014-08-14 NOTE — Progress Notes (Signed)
Pt. Refuses oral potassium this am.  Patient requesting to be discharged home.  Physician notified of patient request.

## 2014-08-14 NOTE — Discharge Instructions (Signed)
Follow with Primary MD PARKS,RANDAL A, PA-C in 7 days   Get CBC, CMP, 2 view Chest X ray checked  by Primary MD next visit.    Activity: As tolerated with Full fall precautions use walker/cane & assistance as needed   Disposition Home    Diet: Heart Healthy Low Carb  Accuchecks 4 times/day, Once in AM empty stomach and then before each meal. Log in all results and show them to your Prim.MD in 3 days. If any glucose reading is under 80 or above 300 call your Prim MD immidiately. Follow Low glucose instructions for glucose under 80 as instructed.   For Heart failure patients - Check your Weight same time everyday, if you gain over 2 pounds, or you develop in leg swelling, experience more shortness of breath or chest pain, call your Primary MD immediately. Follow Cardiac Low Salt Diet and 1.5 lit/day fluid restriction.   On your next visit with your primary care physician please Get Medicines reviewed and adjusted.   Please request your Prim.MD to go over all Hospital Tests and Procedure/Radiological results at the follow up, please get all Hospital records sent to your Prim MD by signing hospital release before you go home.   If you experience worsening of your admission symptoms, develop shortness of breath, life threatening emergency, suicidal or homicidal thoughts you must seek medical attention immediately by calling 911 or calling your MD immediately  if symptoms less severe.  You Must read complete instructions/literature along with all the possible adverse reactions/side effects for all the Medicines you take and that have been prescribed to you. Take any new Medicines after you have completely understood and accpet all the possible adverse reactions/side effects.   Do not drive, operating heavy machinery, perform activities at heights, swimming or participation in water activities or provide baby sitting services if your were admitted for syncope or siezures until you have seen  by Primary MD or a Neurologist and advised to do so again.  Do not drive when taking Pain medications.    Do not take more than prescribed Pain, Sleep and Anxiety Medications  Special Instructions: If you have smoked or chewed Tobacco  in the last 2 yrs please stop smoking, stop any regular Alcohol  and or any Recreational drug use.  Wear Seat belts while driving.   Please note  You were cared for by a hospitalist during your hospital stay. If you have any questions about your discharge medications or the care you received while you were in the hospital after you are discharged, you can call the unit and asked to speak with the hospitalist on call if the hospitalist that took care of you is not available. Once you are discharged, your primary care physician will handle any further medical issues. Please note that NO REFILLS for any discharge medications will be authorized once you are discharged, as it is imperative that you return to your primary care physician (or establish a relationship with a primary care physician if you do not have one) for your aftercare needs so that they can reassess your need for medications and monitor your lab values.

## 2014-08-14 NOTE — Progress Notes (Signed)
Utilization review completed. Grantham Hippert, RN, BSN. 

## 2016-05-31 DIAGNOSIS — G8191 Hemiplegia, unspecified affecting right dominant side: Secondary | ICD-10-CM | POA: Insufficient documentation

## 2016-08-04 DIAGNOSIS — F418 Other specified anxiety disorders: Secondary | ICD-10-CM | POA: Insufficient documentation

## 2016-08-09 DIAGNOSIS — I69998 Other sequelae following unspecified cerebrovascular disease: Secondary | ICD-10-CM | POA: Insufficient documentation

## 2016-08-09 DIAGNOSIS — R531 Weakness: Secondary | ICD-10-CM

## 2016-09-25 ENCOUNTER — Encounter (HOSPITAL_BASED_OUTPATIENT_CLINIC_OR_DEPARTMENT_OTHER): Payer: Self-pay | Admitting: *Deleted

## 2016-09-25 ENCOUNTER — Emergency Department (HOSPITAL_BASED_OUTPATIENT_CLINIC_OR_DEPARTMENT_OTHER): Payer: Medicaid Other

## 2016-09-25 ENCOUNTER — Emergency Department (HOSPITAL_BASED_OUTPATIENT_CLINIC_OR_DEPARTMENT_OTHER)
Admission: EM | Admit: 2016-09-25 | Discharge: 2016-09-25 | Disposition: A | Discharge status: Home / Self Care | Payer: Medicaid Other | Attending: Emergency Medicine | Admitting: Emergency Medicine

## 2016-09-25 DIAGNOSIS — M79641 Pain in right hand: Secondary | ICD-10-CM | POA: Diagnosis not present

## 2016-09-25 DIAGNOSIS — M25511 Pain in right shoulder: Secondary | ICD-10-CM | POA: Diagnosis not present

## 2016-09-25 DIAGNOSIS — R109 Unspecified abdominal pain: Secondary | ICD-10-CM

## 2016-09-25 DIAGNOSIS — F17210 Nicotine dependence, cigarettes, uncomplicated: Secondary | ICD-10-CM | POA: Insufficient documentation

## 2016-09-25 DIAGNOSIS — Z955 Presence of coronary angioplasty implant and graft: Secondary | ICD-10-CM | POA: Diagnosis not present

## 2016-09-25 DIAGNOSIS — I10 Essential (primary) hypertension: Secondary | ICD-10-CM | POA: Diagnosis not present

## 2016-09-25 DIAGNOSIS — R1012 Left upper quadrant pain: Secondary | ICD-10-CM | POA: Diagnosis not present

## 2016-09-25 DIAGNOSIS — E119 Type 2 diabetes mellitus without complications: Secondary | ICD-10-CM | POA: Insufficient documentation

## 2016-09-25 DIAGNOSIS — E039 Hypothyroidism, unspecified: Secondary | ICD-10-CM | POA: Diagnosis not present

## 2016-09-25 DIAGNOSIS — R1084 Generalized abdominal pain: Secondary | ICD-10-CM | POA: Diagnosis present

## 2016-09-25 HISTORY — DX: Acute myocardial infarction, unspecified: I21.9

## 2016-09-25 HISTORY — DX: Cerebral infarction, unspecified: I63.9

## 2016-09-25 LAB — CBC WITH DIFFERENTIAL/PLATELET
BASOS ABS: 0 10*3/uL (ref 0.0–0.1)
Basophils Relative: 0 %
EOS ABS: 0.5 10*3/uL (ref 0.0–0.7)
Eosinophils Relative: 5 %
HCT: 33.9 % — ABNORMAL LOW (ref 36.0–46.0)
Hemoglobin: 11 g/dL — ABNORMAL LOW (ref 12.0–15.0)
LYMPHS PCT: 30 %
Lymphs Abs: 3.2 10*3/uL (ref 0.7–4.0)
MCH: 24.7 pg — ABNORMAL LOW (ref 26.0–34.0)
MCHC: 32.4 g/dL (ref 30.0–36.0)
MCV: 76 fL — ABNORMAL LOW (ref 78.0–100.0)
Monocytes Absolute: 1.1 10*3/uL — ABNORMAL HIGH (ref 0.1–1.0)
Monocytes Relative: 10 %
NEUTROS ABS: 5.7 10*3/uL (ref 1.7–7.7)
Neutrophils Relative %: 55 %
Platelets: 300 10*3/uL (ref 150–400)
RBC: 4.46 MIL/uL (ref 3.87–5.11)
RDW: 22.4 % — AB (ref 11.5–15.5)
WBC: 10.5 10*3/uL (ref 4.0–10.5)

## 2016-09-25 LAB — URINALYSIS, ROUTINE W REFLEX MICROSCOPIC
Bilirubin Urine: NEGATIVE
GLUCOSE, UA: NEGATIVE mg/dL
Hgb urine dipstick: NEGATIVE
Ketones, ur: NEGATIVE mg/dL
Leukocytes, UA: NEGATIVE
Nitrite: NEGATIVE
PROTEIN: NEGATIVE mg/dL
Specific Gravity, Urine: 1.004 — ABNORMAL LOW (ref 1.005–1.030)
pH: 7 (ref 5.0–8.0)

## 2016-09-25 LAB — COMPREHENSIVE METABOLIC PANEL
ALT: 12 U/L — ABNORMAL LOW (ref 14–54)
AST: 14 U/L — AB (ref 15–41)
Albumin: 3.8 g/dL (ref 3.5–5.0)
Alkaline Phosphatase: 128 U/L — ABNORMAL HIGH (ref 38–126)
Anion gap: 8 (ref 5–15)
BILIRUBIN TOTAL: 0.3 mg/dL (ref 0.3–1.2)
BUN: 5 mg/dL — AB (ref 6–20)
CALCIUM: 9.3 mg/dL (ref 8.9–10.3)
CO2: 25 mmol/L (ref 22–32)
Chloride: 105 mmol/L (ref 101–111)
Creatinine, Ser: 0.41 mg/dL — ABNORMAL LOW (ref 0.44–1.00)
GFR calc Af Amer: >60 mL/min (ref >60)
Glucose, Bld: 165 mg/dL — ABNORMAL HIGH (ref 65–99)
POTASSIUM: 3.7 mmol/L (ref 3.5–5.1)
Sodium: 138 mmol/L (ref 135–145)
TOTAL PROTEIN: 7.3 g/dL (ref 6.5–8.1)

## 2016-09-25 LAB — TROPONIN I: Troponin I: <0.03 ng/mL (ref <0.03)

## 2016-09-25 LAB — LIPASE, BLOOD: LIPASE: 24 U/L (ref 11–51)

## 2016-09-25 MED ORDER — SODIUM CHLORIDE 0.9 % IV BOLUS (SEPSIS)
500.0000 mL | Freq: Once | INTRAVENOUS | Status: AC
  Administered 2016-09-25: 500 mL via INTRAVENOUS

## 2016-09-25 MED ORDER — POLYETHYLENE GLYCOL 3350 17 G PO PACK: 17.0000 g | PACK | Freq: Every day | ORAL | 0 refills | Status: AC

## 2016-09-25 MED ORDER — FENTANYL CITRATE (PF) 100 MCG/2ML IJ SOLN
50.0000 ug | Freq: Once | INTRAMUSCULAR | Status: AC
  Administered 2016-09-25: 50 ug via INTRAVENOUS
  Filled 2016-09-25: qty 2

## 2016-09-25 MED ORDER — ONDANSETRON HCL 4 MG/2ML IJ SOLN
4.0000 mg | Freq: Once | INTRAMUSCULAR | Status: AC
  Administered 2016-09-25: 4 mg via INTRAVENOUS
  Filled 2016-09-25: qty 2

## 2016-09-25 NOTE — ED Provider Notes (Signed)
Emergency Department Provider Note   I have reviewed the triage vital signs and the nursing notes.  By signing my name below, I, Laura Oconnor, attest that this documentation has been prepared under the direction and in the presence of Long, Wonda Olds, MD. Electronically Signed: Soijett Oconnor, ED Scribe. 09/25/16. 6:34 PM.  HISTORY  Chief Complaint Flank Pain   HPI Laura Oconnor is a 61 y.o. female with a PMHx of DM, HTN, CVA with baseline right sided weakness, and ACS s/p recent STEMI presents to the Emergency Department complaining of left lateral abdominal pain onset today. Pt reports associated urinary frequency, right hand pain, and right shoulder pain. Pt states that she takes iron daily and was informed by her home RN that her single  was due to the iron supplements. Pt has not tried any medications for the relief of her symptoms. Pt states that her left lateral abdominal pain is worsened with movement. She notes that her left lateral abdominal pain doesn't feel similar to her past MI. Family reports that the pt had a stroke 4 months ago with residual left sided weakness and MI last month that she was evaluated at [facility] for. She denies CP, SOB, dysuria, recent fall, recent injury, hitting her head, and any other symptoms. Denies PMHx of kidney stones.      Past Medical History:  Diagnosis Date  . Diabetes mellitus   . Heart attack ([place])   . History of right coronary artery stent placement    2 stents placed at separate times  . Hypertension   . Stroke ([place])   . Thyroid disease     Patient Active Problem List   Diagnosis Date Noted  . Hypokalemia 08/13/2014  . Diarrhea 08/13/2014  . Loss of weight 08/13/2014  . Hypocalcemia 08/13/2014  . Atrophic vaginitis 08/13/2014  . Type 2 diabetes mellitus, uncontrolled ([place]) 08/13/2014  . Breast cancer ([place]) 08/13/2014  . Hypothyroidism 08/13/2014  . CAD (coronary artery disease) 08/13/2014    Past Surgical History:  Procedure  Laterality Date  . ABDOMINAL HYSTERECTOMY    . LIPOMA EXCISION      Current Outpatient Rx  . Order #: [id number] Class: Historical Med  . Order #: [id number] Class: Historical Med  . Order #: [id number] Class: Historical Med  . Order #: [id number] Class: Print  . Order #: [id number] Class: Historical Med  . Order #: [id number] Class: No Print  . Order #: [id number] Class: Historical Med  . Order #: [id number] Class: Historical Med  . Order #: [id number] Class: Historical Med  . Order #: [id number] Class: Print  . Order #: [id number] Class: Print  . Order #: [id number] Class: Historical Med    Allergies Amoxicillin; Morphine and related; Penicillins; Percocet [oxycodone-acetaminophen]; Percodan [oxycodone-aspirin]; Sulfa antibiotics; and Torecan [thiethylperazine]  Family History  Problem Relation Age of Onset  . Diabetes Father   . Diabetes Sister   . Diabetes Brother     Social History Social History  Substance Use Topics  . Smoking status: Current Every Day Smoker    Packs/day: 0.50    Types: Cigarettes  . Smokeless tobacco: Never Used  . Alcohol use No    Review of Systems Constitutional: No fever/chills Eyes: No visual changes. ENT: No sore throat. Cardiovascular: Denies chest pain. Respiratory: Denies shortness of breath. Gastrointestinal: No abdominal pain.  No nausea, no vomiting.  No diarrhea.  No constipation. Genitourinary: Positive for urinary frequency. Negative for dysuria. Musculoskeletal: Right hand pain, Right shoulder pain. Negative for back pain. Skin: Negative for rash. Neurological: Negative for  headaches, focal weakness or numbness.  10-point ROS otherwise negative.  ____________________________________________   PHYSICAL EXAM:  VITAL SIGNS: ED Triage Vitals  Enc Vitals Group     BP 09/25/16 1755 (!) 150/62     Pulse Rate 09/25/16 1755 (!) 103     Resp 09/25/16 1755 18     Temp 09/25/16 1755 99.1 F (37.3 C)     Temp Source 09/25/16 1755 Oral     SpO2 09/25/16  1755 100 %     Weight 09/25/16 1756 115 lb (52.2 kg)     Height 09/25/16 1756 4\' 11"  (1.499 m)     Pain Score 09/25/16 1752 4   Constitutional: Alert and oriented. Well appearing and in no acute distress. Eyes: Conjunctivae are normal. PERRL. Head: Atraumatic. Nose: No congestion/rhinnorhea. Mouth/Throat: Mucous membranes are moist.  Neck: No stridor.   Cardiovascular: Normal rate, regular rhythm. Good peripheral circulation. Grossly normal heart sounds.   Respiratory: Normal respiratory effort.  No retractions. Lungs CTAB. Gastrointestinal: Soft and nontender. No distention.  Musculoskeletal: No lower extremity tenderness nor edema. Positive right hand and shoulder pain. Limited ROM of shoulder and wrist with contracted RUE.  Neurologic: Positive slurred speech and right sided face, arm, and leg weakness. Contracted RUE.  Skin:  Skin is warm, dry and intact. No rash noted.  ____________________________________________   LABS (all labs ordered are listed, but only abnormal results are displayed)  Labs Reviewed  URINALYSIS, ROUTINE W REFLEX MICROSCOPIC - Abnormal; Notable for the following:       Result Value   Specific Gravity, Urine 1.004 (*)    All other components within normal limits  COMPREHENSIVE METABOLIC PANEL - Abnormal; Notable for the following:    Glucose, Bld 165 (*)    BUN 5 (*)    Creatinine, Ser 0.41 (*)    AST 14 (*)    ALT 12 (*)    Alkaline Phosphatase 128 (*)    All other components within normal limits  CBC WITH DIFFERENTIAL/PLATELET - Abnormal; Notable for the following:    Hemoglobin 11.0 (*)    HCT 33.9 (*)    MCV 76.0 (*)    MCH 24.7 (*)    RDW 22.4 (*)    Monocytes Absolute 1.1 (*)    All other components within normal limits  LIPASE, BLOOD  TROPONIN I   ____________________________________________  EKG   EKG Interpretation  Date/Time:  Monday September 25 2016 18:10:21 EDT Ventricular Rate:  125 PR Interval:    QRS Duration: 127 QT  Interval:  348 QTC Calculation: 508 R Axis:   -86 Text Interpretation:  Sinus rhythm RBBB and LAFB Probable left ventricular hypertrophy No STEMI. Changed from 2016 tracing.  Confirmed by Nanda Quinton [phone]) on 09/25/2016 6:19:11 PM Also confirmed by Nanda Quinton [phone]), editor Drema Pry [phone])  on 09/26/2016 7:27:59 AM       ____________________________________________  RADIOLOGY  Dg Shoulder Right  Result Date: 09/25/2016 CLINICAL DATA:  Shoulder pain EXAM: RIGHT SHOULDER - 2+ VIEW COMPARISON:  None. FINDINGS: No fracture or definite dislocation is seen. Slight inferior positioning of the humeral head with respect to glenoid could relate to shoulder effusion. Mild AC joint degenerative changes. Right lung apex is clear IMPRESSION: 1. No fracture seen 2. Slight inferior positioning of the right humeral head, could relate to the presence of joint effusion. Electronically Signed   By: Donavan Foil M.D.   On: 09/25/2016 19:44   Dg Hand Complete Right  Result Date: 09/25/2016 CLINICAL  DATA:  Right hand pain and shoulder pain EXAM: RIGHT HAND - COMPLETE 3+ VIEW COMPARISON:  None. FINDINGS: No fracture or malalignment. The examination is slightly limited by positioning. Suspected degenerative changes at the DIP joints. Soft tissues are unremarkable. IMPRESSION: Suboptimal study secondary to positioning. No acute osseous abnormality. Suspect degenerative changes at the DIP joints. Electronically Signed   By: Donavan Foil M.D.   On: 09/25/2016 19:42   Ct Renal Stone Study  Result Date: 09/25/2016 CLINICAL DATA:  Left lateral abdominal pain onset today. Urinary frequency. Prior melena. EXAM: CT ABDOMEN AND PELVIS WITHOUT CONTRAST TECHNIQUE: Multidetector CT imaging of the abdomen and pelvis was performed following the standard protocol without IV contrast. COMPARISON:  07/25/2016 FINDINGS: Lower chest: Coronary atherosclerosis.  Mild cardiomegaly. Hepatobiliary: Unremarkable Pancreas:  Punctate calcification in the pancreatic head on image 32/2, stable from 09/29/2011, and likely due to remote inflammation. Spleen: Unremarkable Adrenals/Urinary Tract: Fullness of the left adrenal gland without discrete mass. No hydronephrosis, hydroureter, or discrete ureteral calculus. Urinary bladder unremarkable. Stomach/Bowel: Prominent stool throughout the colon favors constipation. Appendix normal. Vascular/Lymphatic: Aortoiliac atherosclerotic vascular disease. Right external iliac node 1 cm in short axis on image 66/2, stable. Upper normal sized inguinal lymph nodes, stable. Left external iliac node 1.0 cm in diameter, stable. Reproductive: Uterus absent.  Adnexa unremarkable. Other: No supplemental non-categorized findings. Musculoskeletal: Spurring along the hamstring origination sites. IMPRESSION: 1.  Prominent stool throughout the colon favors constipation. 2. Aortic Atherosclerosis (ICD10-I70.0). Coronary atherosclerosis with mild cardiomegaly. 3. Borderline prominent lower pelvic adenopathy, but chronically stable, likely incidental. Electronically Signed   By: Van Clines M.D.   On: 09/25/2016 19:46    ____________________________________________   PROCEDURES  Procedure(s) performed:   Procedures  None ____________________________________________   INITIAL IMPRESSION / ASSESSMENT AND PLAN / ED COURSE  Pertinent labs & imaging results that were available during my care of the patient were reviewed by me and considered in my medical decision making (see chart for details).  Patient presents to the ED for evaluation of left sided abdominal pain along with shoulder and wrist pain. The RUE is contracted from CVA. No recent falls or injury. Plan for plain film to r/o bony abnormality but suspect that pain is 2/2 extremity contraction. No evidence of joint swelling or redness to suggest septic joint. Left abdominal pain is subjective will little tenderness on exam. Plan for CT  to evaluate for possible renal stone.   8:00 PM Imaging reviewed along with labs. No acute findings. Plan for constipation treatment and PCP follow up for shoulder effusion. Patient is re-starting home PT soon which may help extremity pain.   At this time, I do not feel there is any life-threatening condition present. I have reviewed and discussed all results (EKG, imaging, lab, urine as appropriate), exam findings with patient. I have reviewed nursing notes and appropriate previous records.  I feel the patient is safe to be discharged home without further emergent workup. Discussed usual and customary return precautions. Patient and family (if present) verbalize understanding and are comfortable with this plan.  Patient will follow-up with their primary care provider. If they do not have a primary care provider, information for follow-up has been provided to them. All questions have been answered.  ____________________________________________  FINAL CLINICAL IMPRESSION(S) / ED DIAGNOSES  Final diagnoses:  Left flank pain  Right hand pain  Acute pain of right shoulder     MEDICATIONS GIVEN DURING THIS VISIT:  Medications  sodium chloride 0.9 % bolus 500  mL (0 mLs Intravenous Stopped 09/25/16 2042)  fentaNYL (SUBLIMAZE) injection 50 mcg (50 mcg Intravenous Given 09/25/16 1834)  ondansetron (ZOFRAN) injection 4 mg (4 mg Intravenous Given 09/25/16 1834)     NEW OUTPATIENT MEDICATIONS STARTED DURING THIS VISIT:  Discharge Medication List as of 09/25/2016  8:46 PM    START taking these medications   Details  polyethylene glycol (MIRALAX) packet Take 17 g by mouth daily., Starting Mon 09/25/2016, Print        Note:  This document was prepared using Dragon voice recognition software and may include unintentional dictation errors.  Nanda Quinton, MD Emergency Medicine   I personally performed the services described in this documentation, which was scribed in my presence. The recorded  information has been reviewed and is accurate.       Margette Fast, MD 09/26/16 [phone]

## 2016-09-25 NOTE — ED Triage Notes (Addendum)
Pt has a history of heart attack in May and stroke in Feb of this year (pt has residual right side paralysis). Pt's son is providing history. States pt thinks she is dehydrated but he is concerned bc she also is complaining of left side pain in her chest and abdomen today. Pt also c/o pain in her right hand. Also reports she has panic attacks daily

## 2016-09-25 NOTE — Discharge Instructions (Signed)

## 2016-09-25 NOTE — ED Notes (Signed)
Pt is on monitor

## 2016-10-15 ENCOUNTER — Encounter (HOSPITAL_BASED_OUTPATIENT_CLINIC_OR_DEPARTMENT_OTHER): Payer: Self-pay | Admitting: *Deleted

## 2016-10-15 ENCOUNTER — Emergency Department (HOSPITAL_BASED_OUTPATIENT_CLINIC_OR_DEPARTMENT_OTHER)
Admission: EM | Admit: 2016-10-15 | Discharge: 2016-10-16 | Discharge status: Against Medical Advice | Payer: Medicaid Other | Attending: Emergency Medicine | Admitting: Emergency Medicine

## 2016-10-15 ENCOUNTER — Emergency Department (HOSPITAL_BASED_OUTPATIENT_CLINIC_OR_DEPARTMENT_OTHER): Payer: Medicaid Other

## 2016-10-15 DIAGNOSIS — Z5321 Procedure and treatment not carried out due to patient leaving prior to being seen by health care provider: Secondary | ICD-10-CM | POA: Insufficient documentation

## 2016-10-15 DIAGNOSIS — R778 Other specified abnormalities of plasma proteins: Secondary | ICD-10-CM

## 2016-10-15 DIAGNOSIS — I2510 Atherosclerotic heart disease of native coronary artery without angina pectoris: Secondary | ICD-10-CM | POA: Insufficient documentation

## 2016-10-15 DIAGNOSIS — I214 Non-ST elevation (NSTEMI) myocardial infarction: Secondary | ICD-10-CM | POA: Insufficient documentation

## 2016-10-15 DIAGNOSIS — F17210 Nicotine dependence, cigarettes, uncomplicated: Secondary | ICD-10-CM | POA: Insufficient documentation

## 2016-10-15 DIAGNOSIS — Z7982 Long term (current) use of aspirin: Secondary | ICD-10-CM | POA: Diagnosis not present

## 2016-10-15 DIAGNOSIS — Z853 Personal history of malignant neoplasm of breast: Secondary | ICD-10-CM | POA: Diagnosis not present

## 2016-10-15 DIAGNOSIS — Z955 Presence of coronary angioplasty implant and graft: Secondary | ICD-10-CM | POA: Diagnosis not present

## 2016-10-15 DIAGNOSIS — E039 Hypothyroidism, unspecified: Secondary | ICD-10-CM | POA: Insufficient documentation

## 2016-10-15 DIAGNOSIS — R7989 Other specified abnormal findings of blood chemistry: Secondary | ICD-10-CM

## 2016-10-15 DIAGNOSIS — R5383 Other fatigue: Secondary | ICD-10-CM | POA: Diagnosis not present

## 2016-10-15 DIAGNOSIS — E119 Type 2 diabetes mellitus without complications: Secondary | ICD-10-CM | POA: Diagnosis not present

## 2016-10-15 DIAGNOSIS — Z7984 Long term (current) use of oral hypoglycemic drugs: Secondary | ICD-10-CM | POA: Insufficient documentation

## 2016-10-15 DIAGNOSIS — Z794 Long term (current) use of insulin: Secondary | ICD-10-CM | POA: Diagnosis not present

## 2016-10-15 DIAGNOSIS — R531 Weakness: Secondary | ICD-10-CM | POA: Diagnosis present

## 2016-10-15 DIAGNOSIS — Z79899 Other long term (current) drug therapy: Secondary | ICD-10-CM | POA: Diagnosis not present

## 2016-10-15 LAB — TROPONIN I: TROPONIN I: 0.06 ng/mL — AB (ref <0.03)

## 2016-10-15 LAB — CBC WITH DIFFERENTIAL/PLATELET
BASOS PCT: 0 %
Basophils Absolute: 0 10*3/uL (ref 0.0–0.1)
EOS ABS: 0.6 10*3/uL (ref 0.0–0.7)
EOS PCT: 5 %
HEMATOCRIT: 33.8 % — AB (ref 36.0–46.0)
HEMOGLOBIN: 11.1 g/dL — AB (ref 12.0–15.0)
LYMPHS ABS: 4.6 10*3/uL — AB (ref 0.7–4.0)
Lymphocytes Relative: 43 %
MCH: 24.9 pg — AB (ref 26.0–34.0)
MCHC: 32.8 g/dL (ref 30.0–36.0)
MCV: 75.8 fL — AB (ref 78.0–100.0)
MONOS PCT: 10 %
Monocytes Absolute: 1.1 10*3/uL — ABNORMAL HIGH (ref 0.1–1.0)
NEUTROS PCT: 41 %
Neutro Abs: 4.4 10*3/uL (ref 1.7–7.7)
Platelets: 322 10*3/uL (ref 150–400)
RBC: 4.46 MIL/uL (ref 3.87–5.11)
RDW: 21.9 % — AB (ref 11.5–15.5)
WBC: 10.7 10*3/uL — ABNORMAL HIGH (ref 4.0–10.5)

## 2016-10-15 LAB — URINALYSIS, ROUTINE W REFLEX MICROSCOPIC
BILIRUBIN URINE: NEGATIVE
Glucose, UA: NEGATIVE mg/dL
Hgb urine dipstick: NEGATIVE
Ketones, ur: NEGATIVE mg/dL
Leukocytes, UA: NEGATIVE
Nitrite: NEGATIVE
PROTEIN: NEGATIVE mg/dL
Specific Gravity, Urine: 1.008 (ref 1.005–1.030)
pH: 7 (ref 5.0–8.0)

## 2016-10-15 LAB — COMPREHENSIVE METABOLIC PANEL
ALBUMIN: 3.7 g/dL (ref 3.5–5.0)
ALK PHOS: 114 U/L (ref 38–126)
ALT: 11 U/L — ABNORMAL LOW (ref 14–54)
ANION GAP: 6 (ref 5–15)
AST: 15 U/L (ref 15–41)
BILIRUBIN TOTAL: 0.3 mg/dL (ref 0.3–1.2)
BUN: 7 mg/dL (ref 6–20)
CALCIUM: 9 mg/dL (ref 8.9–10.3)
CO2: 25 mmol/L (ref 22–32)
Chloride: 109 mmol/L (ref 101–111)
Creatinine, Ser: 0.41 mg/dL — ABNORMAL LOW (ref 0.44–1.00)
GLUCOSE: 135 mg/dL — AB (ref 65–99)
POTASSIUM: 3.7 mmol/L (ref 3.5–5.1)
Sodium: 140 mmol/L (ref 135–145)
TOTAL PROTEIN: 7.4 g/dL (ref 6.5–8.1)

## 2016-10-15 LAB — BRAIN NATRIURETIC PEPTIDE: B Natriuretic Peptide: 77.8 pg/mL (ref 0.0–100.0)

## 2016-10-15 LAB — D-DIMER, QUANTITATIVE: D-Dimer, Quant: 0.31 ug/mL-FEU (ref 0.00–0.50)

## 2016-10-15 LAB — MAGNESIUM: MAGNESIUM: 1.8 mg/dL (ref 1.7–2.4)

## 2016-10-15 MED ORDER — SODIUM CHLORIDE 0.9 % IV BOLUS (SEPSIS)
500.0000 mL | Freq: Once | INTRAVENOUS | Status: AC
  Administered 2016-10-15: 500 mL via INTRAVENOUS

## 2016-10-15 MED ORDER — ASPIRIN 81 MG PO CHEW
324.0000 mg | CHEWABLE_TABLET | Freq: Once | ORAL | Status: AC
  Administered 2016-10-15: 324 mg via ORAL
  Filled 2016-10-15: qty 4

## 2016-10-15 MED ORDER — LORAZEPAM 2 MG/ML IJ SOLN
0.5000 mg | Freq: Once | INTRAMUSCULAR | Status: AC
  Administered 2016-10-15: 0.5 mg via INTRAVENOUS
  Filled 2016-10-15: qty 1

## 2016-10-15 NOTE — ED Notes (Signed)
Hospital paged [phone]

## 2016-10-15 NOTE — ED Triage Notes (Signed)
Pt son reports 4 days of no bowel movement, weakness and decreased appetite. Pt says she feels dehydrated.

## 2016-10-15 NOTE — ED Notes (Signed)
Called [facility] @ [phone] and asked for Cardiologist on call number.  Was advised that Dr. Phillis Haggis was on call and to page at [phone].  Paged Dr. Phillis Haggis

## 2016-10-15 NOTE — ED Notes (Signed)
Called Supervisor to request admit.  Spoke with supervisor and was advised that patient would be assigned to room 704.  Faxed facesheet per request.  Was advised that they were not able to send someone to transport. Called [facility] and requested transport. Provided receiving physician name (Dr. Nadara Mustard).  Was advised by [facility] that it would be a while before they could get patient.

## 2016-10-15 NOTE — ED Notes (Signed)
Paged Hospitalist @ [facility]

## 2016-10-15 NOTE — ED Provider Notes (Signed)
[place] DEPT M[facility] Provider Note   CSN: [id number] Arrival date & time: 10/15/16  1858  By signing my name below, I, Margit Banda, attest that this documentation has been prepared under the direction and in the presence of Gareth Morgan, MD. Electronically Signed: Margit Banda, ED Scribe. 10/15/16. 7:59 PM.  History   Chief Complaint Chief Complaint  Patient presents with  . Weakness    HPI Laura Oconnor is a 61 y.o. female with a PMHx DM, CAD, HTN and stroke who presents to the Emergency Department complaining of gradually worsening weakness last 4 days. Associated sx include fatigue, constipation, decreased appetite, dehydration, cough, SOB (2 days), panic attacks, and trouble speaking.  Shortness of breath present for 2 days, cough for 4 days. Does not lay down to sleep due to pain.  No sig nasal congestion but feels like mucus in back of her throat.  Is worried her potassium and blood levels are low.. Was given a water enema on Thursday and that was the last time she had a BM. Is unable to clear her throat. She doesn't feel like she is having another stroke. Right-sided weakness/numbness at baseline s/p stroke. No recent falls. Pt denies nausea, vomiting, HA, CP, abdominal pain, rash, fever, chills, black or bloody stools. Son reports she had severe fatigue with prior MI, however also had sensation of elephant on her chest at that time. Sees Barbaraann Boys MD Cardiology associated with [facility] however most recently was seen at [facility].   The history is provided by the patient and a relative. No language interpreter was used.    Past Medical History:  Diagnosis Date  . Diabetes mellitus   . Heart attack ([place])   . History of right coronary artery stent placement    2 stents placed at separate times  . Hypertension   . Stroke ([place])   . Thyroid disease     Patient Active Problem List   Diagnosis Date Noted  . Hypokalemia 08/13/2014  . Diarrhea 08/13/2014  . Loss of weight  08/13/2014  . Hypocalcemia 08/13/2014  . Atrophic vaginitis 08/13/2014  . Type 2 diabetes mellitus, uncontrolled ([place]) 08/13/2014  . Breast cancer ([place]) 08/13/2014  . Hypothyroidism 08/13/2014  . CAD (coronary artery disease) 08/13/2014    Past Surgical History:  Procedure Laterality Date  . ABDOMINAL HYSTERECTOMY    . LIPOMA EXCISION      OB History    No data available       Home Medications    Prior to Admission medications   Medication Sig Start Date End Date Taking? Authorizing Provider  aspirin 81 MG chewable tablet Chew 81 mg by mouth daily.      [provider]  atorvastatin (LIPITOR) 10 MG tablet Take 10 mg by mouth daily.      [provider]  clobetasol cream (TEMOVATE) 4.16 % Apply 1 application topically 2 (two) times daily.    [provider]  ferrous sulfate 325 (65 FE) MG tablet Take 1 tablet (325 mg total) by mouth 2 (two) times daily with a meal. 08/14/14   Thurnell Lose, MD  gabapentin (NEURONTIN) 300 MG capsule Take 300 mg by mouth at bedtime.     [provider]  insulin glargine (LANTUS) 100 UNIT/ML injection Inject 0.75 mLs (75 Units total) into the skin at bedtime. 08/14/14   Thurnell Lose, MD  levothyroxine (SYNTHROID, LEVOTHROID) 125 MCG tablet Take 125 mcg by mouth at bedtime.     [provider]  metFORMIN (GLUCOPHAGE) 500 MG tablet Take 500 mg by mouth daily with breakfast.    [provider]  metoprolol succinate (TOPROL-XL) 25 MG 24 hr tablet Take 25 mg by mouth at bedtime.     [provider]  polyethylene glycol (MIRALAX) packet Take 17 g by mouth daily. 09/25/16   Long, Wonda Olds, MD  Spacer/Aero-Holding Chambers (AEROCHAMBER PLUS WITH MASK) inhaler Use as instructed 04/22/12   Riki Altes, MD  triamcinolone (KENALOG) 0.025 % cream Apply 1 application topically 2 (two) times daily.    [provider]    Family History Family History  Problem Relation Age of Onset  .  Diabetes Father   . Diabetes Sister   . Diabetes Brother     Social History Social History  Substance Use Topics  . Smoking status: Current Every Day Smoker    Packs/day: 0.50    Types: Cigarettes  . Smokeless tobacco: Never Used  . Alcohol use No     Allergies   Amoxicillin; Morphine and related; Penicillins; Percocet [oxycodone-acetaminophen]; Percodan [oxycodone-aspirin]; Sulfa antibiotics; and Torecan [thiethylperazine]   Review of Systems Review of Systems  Constitutional: Positive for appetite change and fatigue. Negative for chills and fever.  HENT: Negative for sore throat.   Eyes: Negative for visual disturbance.  Respiratory: Positive for cough and shortness of breath.   Cardiovascular: Negative for chest pain.  Gastrointestinal: Negative for abdominal pain, blood in stool, nausea and vomiting.  Genitourinary: Negative for difficulty urinating.  Musculoskeletal: Negative for back pain and neck pain.  Skin: Negative for rash.  Neurological: Positive for facial asymmetry (unchanged from baseline), speech difficulty and weakness. Negative for syncope and headaches.  Psychiatric/Behavioral: The patient is nervous/anxious.      Physical Exam Updated Vital Signs BP (!) 179/97   Pulse (!) 119   Temp 98.4 F (36.9 C) (Oral)   Resp (!) 23   Ht 4\' 11"  (1.499 m)   Wt 52.2 kg (115 lb)   SpO2 100%   BMI 23.23 kg/m   Physical Exam  Constitutional: She is oriented to person, place, and time. She appears well-developed and well-nourished. No distress.  HENT:  Head: Normocephalic and atraumatic.  Eyes: Conjunctivae and EOM are normal.  Neck: Normal range of motion.  Cardiovascular: Regular rhythm, normal heart sounds and intact distal pulses.  Tachycardia present.  Exam reveals no gallop and no friction rub.   No murmur heard. Pulmonary/Chest: Effort normal and breath sounds normal. No respiratory distress. She has no wheezes. She has no rales.  Abdominal: Soft. She  exhibits no distension. There is no tenderness. There is no guarding.  Musculoskeletal: She exhibits no edema or tenderness.  Neurological: She is alert and oriented to person, place, and time.  Right sided hemiplegia, facial droop Slow speech  Skin: Skin is warm and dry. No rash noted. She is not diaphoretic. No erythema.  Nursing note and vitals reviewed.    ED Treatments / Results  DIAGNOSTIC STUDIES: Oxygen Saturation is 99% on RA, normal by my interpretation.   COORDINATION OF CARE: 7:59 PM-Discussed next steps with pt which includes giving her IV fluids. Pt verbalized understanding and is agreeable with the plan.   Labs (all labs ordered are listed, but only abnormal results are displayed) Labs Reviewed  CBC WITH DIFFERENTIAL/PLATELET - Abnormal; Notable for the following:       Result Value   WBC 10.7 (*)    Hemoglobin 11.1 (*)    HCT 33.8 (*)  MCV 75.8 (*)    MCH 24.9 (*)    RDW 21.9 (*)    Lymphs Abs 4.6 (*)    Monocytes Absolute 1.1 (*)    All other components within normal limits  COMPREHENSIVE METABOLIC PANEL - Abnormal; Notable for the following:    Glucose, Bld 135 (*)    Creatinine, Ser 0.41 (*)    ALT 11 (*)    All other components within normal limits  TROPONIN I - Abnormal; Notable for the following:    Troponin I 0.06 (*)    All other components within normal limits  BRAIN NATRIURETIC PEPTIDE  MAGNESIUM  URINALYSIS, ROUTINE W REFLEX MICROSCOPIC  D-DIMER, QUANTITATIVE (NOT AT [facility])    EKG  EKG Interpretation  Date/Time:  "Sunday October 15 2016 20:09:34 EDT Ventricular Rate:  100 PR Interval:    QRS Duration: 94 QT Interval:  352 QTC Calculation: 454 R Axis:   38 Text Interpretation:  Sinus tachycardia Probable left atrial enlargement Borderline abnrm T, anterolateral leads Since prior ECG, rate has slowed Confirmed by ,  ([id number]) on 10/15/2016 9:57:11 PM       Radiology Dg Chest 2 View  Result Date: 10/15/2016 CLINICAL DATA:   Initial evaluation for acute shortness of breath, fatigue. EXAM: CHEST  2 VIEW COMPARISON:  Prior CT from 08/24/2016. FINDINGS: The cardiac and mediastinal silhouettes are stable in size and contour, and remain within normal limits. Coronary stent noted. The lungs are normally inflated. No airspace consolidation, pleural effusion, or pulmonary edema is identified. There is no pneumothorax. Right humeral head is subluxed inferiorly relative to the glenoid. No other acute osseous abnormality. IMPRESSION: 1. No active cardiopulmonary disease. 2. Inferior subluxation of the right humeral head relative to the glenoid. Electronically Signed   By: Benjamin  McClintock M.D.   On: 10/15/2016 20:47    Procedures Procedures (including critical care time)  Medications Ordered in ED Medications  aspirin chewable tablet 324 mg (324 mg Oral Given 10/15/16 2125)  sodium chloride 0.9 % bolus 500 mL (0 mLs Intravenous Stopped 10/16/16 0047)  LORazepam (ATIVAN) injection 0.5 mg (0.5 mg Intravenous Given 10/15/16 2334)   CRITICAL CARE: myocardial infarction, elevated troponin Performed by: ,  Elizabeth   Total critical care time: 60 minutes  Critical care time was exclusive of separately billable procedures and treating other patients.  Critical care was necessary to treat or prevent imminent or life-threatening deterioration.  Critical care was time spent personally by me on the following activities: development of treatment plan with patient and/or surrogate as well as nursing, discussions with consultants, evaluation of patient's response to treatment, examination of patient, obtaining history from patient or surrogate, ordering and performing treatments and interventions, ordering and review of laboratory studies, ordering and review of radiographic studies, pulse oximetry and re-evaluation of patient's condition.   Initial Impression / Assessment and Plan / ED Course  I have reviewed the triage  vital signs and the nursing notes.  Pertinent labs & imaging results that were available during my care of the patient were reviewed by me and considered in my medical decision making (see chart for details).      61"  year old female with a history of CVA with right-sided weakness, hypertension, diabetescoronary artery disease status post PCI in 2005 to the LAD, and repeat PCI for restenosis in 2008, PCI to the RCA in February 2017, recent admission in May to [facility] hospital for concern of NSTEMI versus demand ischemia with a peak troponin of 3 and  no cardiac catheterization who presents with concern for fatigue and shortness of breath.  Patient also reports panic attacks, and per prior notes there is some concern her "panic attacks" may be symptoms of cardiac ischemia for her.   No sign of significant anemia as etiology of fatigue, no significant electrolyte abnormalities, and no sign of infection on chest x-ray or urinalysis. BNP within normal limits and doubt acute CHF. D-dimer is negative.   Her troponin is elevated to 0.06. Administered aspirin, but did not administer immediate heparin given atypical symptoms. Recommended admission to hospital.  Given patient's recent evaluation at [facility] with NSTEMI in May, attempted admission to [facility].  Multiple calls were made to [facility] without receiving callback.    Given no sign of CHF, history concerning for dehydration, ordered pt IV fluids. Gave ativan for anxiety.  Patient reports she would like to be admitted to [facility] instead of [facility].    Patient and son now state they prefer [facility].  We attempted to call [facility] but did not receive call, however we were able to discuss with hospitalist who accepted the patient in transfer. Transport was arranged.   At time of transport coming to take the patient to [facility], she states that she does not want to be admitted. Discussed my  recommendation for her to stay in detail, and discussed this with both her and her son. Discussed concern that she most likely is having a heart attack, and that her elevated heart enzymes and her medical history suggest this is occurring, and that she is at high risk of death, disability, congestive heart failure or heart arrhythmia without admission and cardiac evaluation. Patient initially only stating that she wants to go home, however after a long conversation, she is able to state that she wants to go home, and does understand the risk of heart attack and death specifically. Her son understands and has tried to talk to her about staying however he does not have POA and she makes her own decisions.  Patient left against medical advice. She has capacity and has made her decision understanding the risks.  Recommended if she changes her mind she should immediately return to any ED for admission and Cardiac care.   I personally performed the services described in this documentation, which was scribed in my presence. The recorded information has been reviewed and is accurate.   Final Clinical Impressions(s) / ED Diagnoses   Final diagnoses:  NSTEMI (non-ST elevated myocardial infarction) ([place])  Other fatigue  Elevated troponin    New Prescriptions Discharge Medication List as of 10/16/2016 12:49 AM       Gareth Morgan, MD 10/16/16 0128

## 2016-10-15 NOTE — ED Notes (Signed)
Hospitalist paged [phone]

## 2016-10-15 NOTE — ED Notes (Signed)
No response from Hospitalist at [facility] and was put on hold for 5 minutes.  Called back and s/w floor who advised supervisor was in the middle of a Code 4.  She advised to call [phone].  Called that number and paged hospitalist.

## 2016-10-15 NOTE — ED Notes (Signed)
All Notes documented today from 19:00 till 23:50 were done by Margaretha Glassing and not Arlan Organ (GT).  Notes are showing up under GT due to not being logged off of system

## 2016-10-16 DIAGNOSIS — I6930 Unspecified sequelae of cerebral infarction: Secondary | ICD-10-CM | POA: Insufficient documentation

## 2016-10-16 NOTE — ED Notes (Addendum)
Pt/ family updated with "transport would be here soon to take them to [facility] inpt room (admission)". Pt verbalizing "cancel ambulance, not going". Son in agreement with his mother/pt. Adamant about refusal. EDP made aware, into room to speak with pt/family.

## 2016-11-08 ENCOUNTER — Emergency Department (HOSPITAL_BASED_OUTPATIENT_CLINIC_OR_DEPARTMENT_OTHER)
Admission: EM | Admit: 2016-11-08 | Discharge: 2016-11-09 | Disposition: A | Discharge status: Home / Self Care | Payer: Medicaid Other | Attending: Emergency Medicine | Admitting: Emergency Medicine

## 2016-11-08 ENCOUNTER — Encounter (HOSPITAL_BASED_OUTPATIENT_CLINIC_OR_DEPARTMENT_OTHER): Payer: Self-pay

## 2016-11-08 ENCOUNTER — Emergency Department (HOSPITAL_BASED_OUTPATIENT_CLINIC_OR_DEPARTMENT_OTHER): Payer: Medicaid Other

## 2016-11-08 DIAGNOSIS — I252 Old myocardial infarction: Secondary | ICD-10-CM | POA: Diagnosis not present

## 2016-11-08 DIAGNOSIS — E119 Type 2 diabetes mellitus without complications: Secondary | ICD-10-CM | POA: Insufficient documentation

## 2016-11-08 DIAGNOSIS — Z955 Presence of coronary angioplasty implant and graft: Secondary | ICD-10-CM | POA: Insufficient documentation

## 2016-11-08 DIAGNOSIS — F17210 Nicotine dependence, cigarettes, uncomplicated: Secondary | ICD-10-CM | POA: Insufficient documentation

## 2016-11-08 DIAGNOSIS — R5381 Other malaise: Secondary | ICD-10-CM | POA: Insufficient documentation

## 2016-11-08 DIAGNOSIS — Z794 Long term (current) use of insulin: Secondary | ICD-10-CM | POA: Diagnosis not present

## 2016-11-08 DIAGNOSIS — E039 Hypothyroidism, unspecified: Secondary | ICD-10-CM | POA: Diagnosis not present

## 2016-11-08 DIAGNOSIS — Z8673 Personal history of transient ischemic attack (TIA), and cerebral infarction without residual deficits: Secondary | ICD-10-CM | POA: Insufficient documentation

## 2016-11-08 DIAGNOSIS — K5900 Constipation, unspecified: Secondary | ICD-10-CM | POA: Diagnosis not present

## 2016-11-08 DIAGNOSIS — I10 Essential (primary) hypertension: Secondary | ICD-10-CM | POA: Diagnosis not present

## 2016-11-08 DIAGNOSIS — I259 Chronic ischemic heart disease, unspecified: Secondary | ICD-10-CM | POA: Insufficient documentation

## 2016-11-08 DIAGNOSIS — R531 Weakness: Secondary | ICD-10-CM | POA: Diagnosis present

## 2016-11-08 DIAGNOSIS — Z7984 Long term (current) use of oral hypoglycemic drugs: Secondary | ICD-10-CM | POA: Insufficient documentation

## 2016-11-08 DIAGNOSIS — Z853 Personal history of malignant neoplasm of breast: Secondary | ICD-10-CM | POA: Diagnosis not present

## 2016-11-08 LAB — URINALYSIS, ROUTINE W REFLEX MICROSCOPIC
BILIRUBIN URINE: NEGATIVE
Glucose, UA: NEGATIVE mg/dL
Hgb urine dipstick: NEGATIVE
KETONES UR: NEGATIVE mg/dL
LEUKOCYTES UA: NEGATIVE
NITRITE: NEGATIVE
PROTEIN: NEGATIVE mg/dL
Specific Gravity, Urine: 1.011 (ref 1.005–1.030)
pH: 7.5 (ref 5.0–8.0)

## 2016-11-08 LAB — CBC WITH DIFFERENTIAL/PLATELET
BASOS ABS: 0 10*3/uL (ref 0.0–0.1)
Basophils Relative: 0 %
EOS ABS: 0.5 10*3/uL (ref 0.0–0.7)
Eosinophils Relative: 5 %
HCT: 32.4 % — ABNORMAL LOW (ref 36.0–46.0)
Hemoglobin: 10.3 g/dL — ABNORMAL LOW (ref 12.0–15.0)
LYMPHS ABS: 4 10*3/uL (ref 0.7–4.0)
Lymphocytes Relative: 37 %
MCH: 24.6 pg — ABNORMAL LOW (ref 26.0–34.0)
MCHC: 31.8 g/dL (ref 30.0–36.0)
MCV: 77.3 fL — ABNORMAL LOW (ref 78.0–100.0)
MONO ABS: 1.2 10*3/uL — AB (ref 0.1–1.0)
Monocytes Relative: 11 %
Neutro Abs: 5.1 10*3/uL (ref 1.7–7.7)
Neutrophils Relative %: 47 %
PLATELETS: 326 10*3/uL (ref 150–400)
RBC: 4.19 MIL/uL (ref 3.87–5.11)
RDW: 19.3 % — AB (ref 11.5–15.5)
WBC: 10.8 10*3/uL — AB (ref 4.0–10.5)

## 2016-11-08 LAB — COMPREHENSIVE METABOLIC PANEL
ALK PHOS: 144 U/L — AB (ref 38–126)
ALT: 10 U/L — ABNORMAL LOW (ref 14–54)
AST: 18 U/L (ref 15–41)
Albumin: 4 g/dL (ref 3.5–5.0)
Anion gap: 9 (ref 5–15)
BILIRUBIN TOTAL: 0.5 mg/dL (ref 0.3–1.2)
BUN: 8 mg/dL (ref 6–20)
CALCIUM: 9.5 mg/dL (ref 8.9–10.3)
CO2: 28 mmol/L (ref 22–32)
Chloride: 102 mmol/L (ref 101–111)
Creatinine, Ser: 0.38 mg/dL — ABNORMAL LOW (ref 0.44–1.00)
GFR calc Af Amer: >60 mL/min (ref >60)
Glucose, Bld: 121 mg/dL — ABNORMAL HIGH (ref 65–99)
POTASSIUM: 3.5 mmol/L (ref 3.5–5.1)
Sodium: 139 mmol/L (ref 135–145)
TOTAL PROTEIN: 8.1 g/dL (ref 6.5–8.1)

## 2016-11-08 LAB — CBG MONITORING, ED: Glucose-Capillary: 121 mg/dL — ABNORMAL HIGH (ref 65–99)

## 2016-11-08 LAB — TROPONIN I: Troponin I: <0.03 ng/mL (ref <0.03)

## 2016-11-08 MED ORDER — FENTANYL CITRATE (PF) 100 MCG/2ML IJ SOLN
25.0000 ug | Freq: Once | INTRAMUSCULAR | Status: AC
  Administered 2016-11-08: 25 ug via INTRAVENOUS
  Filled 2016-11-08: qty 2

## 2016-11-08 MED ORDER — SODIUM CHLORIDE 0.9 % IV BOLUS (SEPSIS)
500.0000 mL | Freq: Once | INTRAVENOUS | Status: AC
  Administered 2016-11-08: 500 mL via INTRAVENOUS

## 2016-11-08 NOTE — ED Triage Notes (Signed)
C/o feeling weak, dizzy, constipated x 3 days-presents to triage in w/c

## 2016-11-08 NOTE — ED Provider Notes (Signed)
[place] DEPT MHP Provider Note   CSN: [id number] Arrival date & time: 11/08/16  1934   By signing my name below, I, Eunice Blase, attest that this documentation has been prepared under the direction and in the presence of Quintella Reichert, MD. Electronically signed, Eunice Blase, ED Scribe. 11/08/16. 8:29 PM.   History   Chief Complaint Chief Complaint  Patient presents with  . Weakness   The history is provided by the patient and medical records. No language interpreter was used.    Laura Oconnor is a 61 y.o. female presenting to the Emergency Department concerning weakness x 3 days. Pt also c/o dehydration (dry mouth sensation), constipation, dizziness, increased urination, occasional abdominal pain and nausea. Last BM 4 days ago. Flatus noted. She still notes urge to defecate with inability to relieve bowels. Pt has taken magnesium citrate, laxatives and used enemas for constipation without relief. Pt mobile with wheelchair. H/o DM, heart attack, CAD, thyroid and stroke. Mild baseline SOB noted since her stroke. She states she takes ASA and plavix as advised at home. No dysuria, chest pain, fevers, vomiting, wounds or lesions. No other complaints at this time.   Past Medical History:  Diagnosis Date  . Diabetes mellitus   . Heart attack ([place])   . History of right coronary artery stent placement    2 stents placed at separate times  . Hypertension   . Stroke ([place])   . Thyroid disease     Patient Active Problem List   Diagnosis Date Noted  . Hypokalemia 08/13/2014  . Diarrhea 08/13/2014  . Loss of weight 08/13/2014  . Hypocalcemia 08/13/2014  . Atrophic vaginitis 08/13/2014  . Type 2 diabetes mellitus, uncontrolled ([place]) 08/13/2014  . Breast cancer ([place]) 08/13/2014  . Hypothyroidism 08/13/2014  . CAD (coronary artery disease) 08/13/2014    Past Surgical History:  Procedure Laterality Date  . ABDOMINAL HYSTERECTOMY    . LIPOMA EXCISION      OB History    No  data available       Home Medications    Prior to Admission medications   Medication Sig Start Date End Date Taking? Authorizing Provider  aspirin 81 MG chewable tablet Chew 81 mg by mouth daily.      [provider]  atorvastatin (LIPITOR) 10 MG tablet Take 10 mg by mouth daily.      [provider]  clobetasol cream (TEMOVATE) 1.49 % Apply 1 application topically 2 (two) times daily.    [provider]  ferrous sulfate 325 (65 FE) MG tablet Take 1 tablet (325 mg total) by mouth 2 (two) times daily with a meal. 08/14/14   Thurnell Lose, MD  gabapentin (NEURONTIN) 300 MG capsule Take 300 mg by mouth at bedtime.     [provider]  insulin glargine (LANTUS) 100 UNIT/ML injection Inject 0.75 mLs (75 Units total) into the skin at bedtime. 08/14/14   Thurnell Lose, MD  levothyroxine (SYNTHROID, LEVOTHROID) 125 MCG tablet Take 125 mcg by mouth at bedtime.     [provider]  metFORMIN (GLUCOPHAGE) 500 MG tablet Take 500 mg by mouth daily with breakfast.    [provider]  metoprolol succinate (TOPROL-XL) 25 MG 24 hr tablet Take 25 mg by mouth at bedtime.     [provider]  polyethylene glycol (MIRALAX) packet Take 17 g by mouth daily. 09/25/16   Long, Wonda Olds, MD  Spacer/Aero-Holding Chambers (AEROCHAMBER PLUS WITH MASK) inhaler Use as instructed 04/22/12  Riki Altes, MD  triamcinolone (KENALOG) 0.025 % cream Apply 1 application topically 2 (two) times daily.    [provider]    Family History Family History  Problem Relation Age of Onset  . Diabetes Father   . Diabetes Sister   . Diabetes Brother     Social History Social History  Substance Use Topics  . Smoking status: Current Every Day Smoker    Packs/day: 0.50    Types: Cigarettes  . Smokeless tobacco: Never Used  . Alcohol use No     Allergies   Amoxicillin; Morphine and related; Penicillins; Percocet [oxycodone-acetaminophen]; Percodan  [oxycodone-aspirin]; Sulfa antibiotics; and Torecan [thiethylperazine]   Review of Systems Review of Systems  Constitutional: Positive for appetite change. Negative for fever.  Respiratory: Negative for shortness of breath.   Cardiovascular: Negative for chest pain.  Gastrointestinal: Positive for abdominal pain, constipation and nausea. Negative for vomiting.  Genitourinary: Positive for frequency. Negative for decreased urine volume, difficulty urinating and dysuria.  Skin: Negative for wound.  Neurological: Positive for weakness.  All other systems reviewed and are negative.    Physical Exam Updated Vital Signs BP 128/61 (BP Location: Left Arm)   Pulse (!) 105   Temp 98.7 F (37.1 C) (Oral)   Resp 18   SpO2 100%   Physical Exam  Constitutional: She is oriented to person, place, and time. She appears well-developed and well-nourished.  HENT:  Head: Normocephalic and atraumatic.  Cardiovascular: Regular rhythm.   No murmur heard. Tachycardic  Pulmonary/Chest: Effort normal and breath sounds normal. No respiratory distress.  Abdominal: Soft. There is no tenderness. There is no rebound and no guarding.  Musculoskeletal: She exhibits no edema or tenderness.  Neurological: She is alert and oriented to person, place, and time.  Dysarthric and slow speech. Right hemiparesis.  Skin: Skin is warm and dry.  Psychiatric: She has a normal mood and affect. Her behavior is normal.  Nursing note and vitals reviewed.    ED Treatments / Results  DIAGNOSTIC STUDIES: Oxygen Saturation is 100% on RA, NL by my interpretation.    COORDINATION OF CARE: 8:21 PM-Discussed next steps with pt. Pt verbalized understanding and is agreeable with the plan. Will order Xr and fluids.   Labs (all labs ordered are listed, but only abnormal results are displayed) Labs Reviewed  COMPREHENSIVE METABOLIC PANEL - Abnormal; Notable for the following:       Result Value   Glucose, Bld 121 (*)     Creatinine, Ser 0.38 (*)    ALT 10 (*)    Alkaline Phosphatase 144 (*)    All other components within normal limits  CBC WITH DIFFERENTIAL/PLATELET - Abnormal; Notable for the following:    WBC 10.8 (*)    Hemoglobin 10.3 (*)    HCT 32.4 (*)    MCV 77.3 (*)    MCH 24.6 (*)    RDW 19.3 (*)    Monocytes Absolute 1.2 (*)    All other components within normal limits  URINALYSIS, ROUTINE W REFLEX MICROSCOPIC - Abnormal; Notable for the following:    APPearance CLOUDY (*)    All other components within normal limits  CBG MONITORING, ED - Abnormal; Notable for the following:    Glucose-Capillary 121 (*)    All other components within normal limits  TROPONIN I    EKG  EKG Interpretation  Date/Time:  Wednesday November 08 2016 19:41:11 EDT Ventricular Rate:  110 PR Interval:  166 QRS Duration: 90 QT Interval:  348 QTC Calculation: 470 R Axis:   14 Text Interpretation:  Sinus tachycardia Possible Left atrial enlargement Possible Anterior infarct , age undetermined Abnormal ECG Confirmed by Quintella Reichert [phone]) on 11/08/2016 8:46:27 PM       Radiology Dg Abd Acute W/chest  Result Date: 11/08/2016 CLINICAL DATA:  Constipation. EXAM: DG ABDOMEN ACUTE W/ 1V CHEST COMPARISON:  CT scan of September 25, 2016. FINDINGS: The abdomen is poorly visualized due to overlying right hand and forearm, as well as overlying external object over lower abdomen. No definite bowel dilatation is seen in the visualized portion the abdomen. Moderate stool is seen in the right side of the abdomen. Heart size and mediastinal contours are within normal limits. Both lungs are clear. IMPRESSION: Abdomen is not well visualized due to overlying structures. Moderate amount of stool is seen in the right colon. No acute cardiopulmonary disease. Electronically Signed   By: Marijo Conception, M.D.   On: 11/08/2016 21:20    Procedures Procedures (including critical care time)  Medications Ordered in ED Medications  sodium  chloride 0.9 % bolus 500 mL (0 mLs Intravenous Stopped 11/08/16 2305)  fentaNYL (SUBLIMAZE) injection 25 mcg (25 mcg Intravenous Given 11/08/16 2305)     Initial Impression / Assessment and Plan / ED Course  I have reviewed the triage vital signs and the nursing notes.  Pertinent labs & imaging results that were available during my care of the patient were reviewed by me and considered in my medical decision making (see chart for details).   patient here for evaluation of constipation, feels dehydrated. Acute abdominal series is limited secondary to patient's positioning. No evidence of UTI. BMP was stable renal function. Discussed with patient recommendation for treating constipation with repeat assessment and patient declines in the ED. She would like a dose of pain medication for her chronic right arm pain and would like to be discharged home. Discussed with patient concerns for narcotic pain medicines with her history of constipation and she recognizes this risk. She declines treatment for constipation in the department for further workup. Plan to DC home with outpatient follow-up and return precautions.  Final Clinical Impressions(s) / ED Diagnoses   Final diagnoses:  Constipation, unspecified constipation type  Malaise    New Prescriptions New Prescriptions   No medications on file  I personally performed the services described in this documentation, which was scribed in my presence. The recorded information has been reviewed and is accurate.    Quintella Reichert, MD 11/09/16 0000

## 2016-11-19 ENCOUNTER — Emergency Department (HOSPITAL_BASED_OUTPATIENT_CLINIC_OR_DEPARTMENT_OTHER)
Admission: EM | Admit: 2016-11-19 | Discharge: 2016-11-19 | Disposition: A | Discharge status: Home / Self Care | Payer: Medicaid Other | Attending: Emergency Medicine | Admitting: Emergency Medicine

## 2016-11-19 ENCOUNTER — Encounter (HOSPITAL_BASED_OUTPATIENT_CLINIC_OR_DEPARTMENT_OTHER): Payer: Self-pay | Admitting: *Deleted

## 2016-11-19 DIAGNOSIS — Z794 Long term (current) use of insulin: Secondary | ICD-10-CM | POA: Diagnosis not present

## 2016-11-19 DIAGNOSIS — Z79899 Other long term (current) drug therapy: Secondary | ICD-10-CM | POA: Diagnosis not present

## 2016-11-19 DIAGNOSIS — Z7902 Long term (current) use of antithrombotics/antiplatelets: Secondary | ICD-10-CM | POA: Insufficient documentation

## 2016-11-19 DIAGNOSIS — Z959 Presence of cardiac and vascular implant and graft, unspecified: Secondary | ICD-10-CM | POA: Insufficient documentation

## 2016-11-19 DIAGNOSIS — I69959 Hemiplegia and hemiparesis following unspecified cerebrovascular disease affecting unspecified side: Secondary | ICD-10-CM | POA: Diagnosis not present

## 2016-11-19 DIAGNOSIS — M79601 Pain in right arm: Secondary | ICD-10-CM | POA: Diagnosis present

## 2016-11-19 DIAGNOSIS — I10 Essential (primary) hypertension: Secondary | ICD-10-CM | POA: Insufficient documentation

## 2016-11-19 DIAGNOSIS — Z8673 Personal history of transient ischemic attack (TIA), and cerebral infarction without residual deficits: Secondary | ICD-10-CM

## 2016-11-19 DIAGNOSIS — E039 Hypothyroidism, unspecified: Secondary | ICD-10-CM | POA: Insufficient documentation

## 2016-11-19 DIAGNOSIS — Z7984 Long term (current) use of oral hypoglycemic drugs: Secondary | ICD-10-CM | POA: Diagnosis not present

## 2016-11-19 DIAGNOSIS — F17210 Nicotine dependence, cigarettes, uncomplicated: Secondary | ICD-10-CM | POA: Insufficient documentation

## 2016-11-19 DIAGNOSIS — G8191 Hemiplegia, unspecified affecting right dominant side: Secondary | ICD-10-CM

## 2016-11-19 DIAGNOSIS — E119 Type 2 diabetes mellitus without complications: Secondary | ICD-10-CM | POA: Insufficient documentation

## 2016-11-19 DIAGNOSIS — Z7982 Long term (current) use of aspirin: Secondary | ICD-10-CM | POA: Diagnosis not present

## 2016-11-19 DIAGNOSIS — I259 Chronic ischemic heart disease, unspecified: Secondary | ICD-10-CM | POA: Insufficient documentation

## 2016-11-19 MED ORDER — TRAMADOL HCL 50 MG PO TABS: 50.0000 mg | ORAL_TABLET | Freq: Four times a day (QID) | ORAL | 0 refills | Status: AC | PRN

## 2016-11-19 MED ORDER — FENTANYL CITRATE (PF) 100 MCG/2ML IJ SOLN
75.0000 ug | Freq: Once | INTRAMUSCULAR | Status: AC
  Administered 2016-11-19: 50 ug via INTRAMUSCULAR
  Filled 2016-11-19: qty 2

## 2016-11-19 NOTE — ED Triage Notes (Signed)
Patient states she has chronic pain In the right arm and hand since having a stroke in Feb, 2018.  States for the last two days the pain is worse, on a scale of 10 her pain is a 10.

## 2016-11-19 NOTE — ED Notes (Signed)
Pt in wheelchair. Same at home.

## 2016-11-19 NOTE — Discharge Instructions (Signed)
Tramadol as prescribed as needed for pain.  Follow-up with neurology. The contact information for both [place] neurology and [facility] neurology has been provided in this discharge summary for you to call and make these arrangements.

## 2016-11-19 NOTE — ED Notes (Signed)
Pt transferred from wheelchair to lowered bed via family member total assist. Per pt request.

## 2016-11-19 NOTE — ED Provider Notes (Signed)
[place] DEPT [facility] Provider Note   CSN: [id number] Arrival date & time: 11/19/16  8185     History   Chief Complaint Chief Complaint  Patient presents with  . Arm Pain    right    HPI Laura Oconnor is a 61 y.o. female.  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 She presents today with complaints of pain in her right arm. This is been ongoing since the stroke which occurred in February. She denies any new injury or trauma. She has been in the hospital and tells me she was given fentanyl which has helped. She denies any chest pain, shortness of breath, or other symptoms.   The history is provided by the patient.  Arm Pain  This is a chronic problem. Episode onset: February. The problem occurs constantly. The problem has not changed since onset.Pertinent negatives include no headaches. Nothing aggravates the symptoms. She has tried nothing for the symptoms.    Past Medical History:  Diagnosis Date  . Diabetes mellitus   . Heart attack ([place])   . History of right coronary artery stent placement    2 stents placed at separate times  . Hypertension   . Stroke ([name])   . Thyroid disease     Patient Active Problem List   Diagnosis Date Noted  . Hypokalemia 08/13/2014  . Diarrhea 08/13/2014  . Loss of weight 08/13/2014  . Hypocalcemia 08/13/2014  . Atrophic vaginitis 08/13/2014  . Type 2 diabetes mellitus, uncontrolled ([place]) 08/13/2014  . Breast cancer ([place]) 08/13/2014  . Hypothyroidism 08/13/2014  . CAD (coronary artery disease) 08/13/2014    Past Surgical History:  Procedure Laterality Date  . ABDOMINAL HYSTERECTOMY     patient denies  . BLADDER SUSPENSION    . LIPOMA EXCISION      OB History    No data available       Home Medications    Prior to Admission medications   Medication Sig Start Date End Date Taking? Authorizing Provider  atorvastatin (LIPITOR) 10 MG tablet Take 10 mg by mouth daily.      Yes [provider]  clobetasol cream (TEMOVATE) 6.31 % Apply 1 application topically 2 (two) times daily.   Yes [provider]  clopidogrel (PLAVIX) 75 MG tablet Take 75 mg by mouth daily.   Yes [provider]  ferrous sulfate 325 (65 FE) MG tablet Take 1 tablet (325 mg total) by mouth 2 (two) times daily with a meal. 08/14/14  Yes Thurnell Lose, MD  insulin glargine (LANTUS) 100 UNIT/ML injection Inject 0.75 mLs (75 Units total) into the skin at bedtime. 08/14/14  Yes Thurnell Lose, MD  levothyroxine (SYNTHROID, LEVOTHROID) 125 MCG tablet Take 125 mcg by mouth at bedtime.    Yes [provider]  metoprolol succinate (TOPROL-XL) 25 MG 24 hr tablet Take 25 mg by mouth at bedtime.    Yes [provider]  polyethylene glycol (MIRALAX) packet Take 17 g by mouth daily. 09/25/16  Yes Long, Wonda Olds, MD  Spacer/Aero-Holding Chambers (AEROCHAMBER PLUS WITH MASK) inhaler Use as instructed 04/22/12  Yes Riki Altes, MD  triamcinolone (KENALOG) 0.025 % cream Apply 1 application topically 2 (two) times daily.   Yes [provider]  aspirin 81 MG chewable tablet Chew 81 mg by mouth daily.      [provider]  gabapentin (NEURONTIN) 300 MG capsule Take 300 mg by mouth at bedtime.  [provider]  metFORMIN (GLUCOPHAGE) 500 MG tablet Take 500 mg by mouth daily with breakfast.    [provider]    Family History Family History  Problem Relation Age of Onset  . Diabetes Father   . Diabetes Sister   . Diabetes Brother     Social History Social History  Substance Use Topics  . Smoking status: Current Every Day Smoker    Packs/day: 0.50    Types: Cigarettes  . Smokeless tobacco: Never Used  . Alcohol use No     Allergies   Amoxicillin; Morphine and related; Penicillins; Percocet [oxycodone-acetaminophen]; Percodan [oxycodone-aspirin]; Sulfa antibiotics; and Torecan [thiethylperazine]   Review of  Systems Review of Systems  Neurological: Negative for headaches.  All other systems reviewed and are negative.    Physical Exam Updated Vital Signs BP (!) 120/58 (BP Location: Left Arm)   Pulse (!) 106   Temp 98.7 F (37.1 C) (Oral)   Resp 18   Ht 4\' 11"  (1.499 m)   Wt 48.5 kg (107 lb)   SpO2 100%   BMI 21.61 kg/m   Physical Exam  Constitutional: She is oriented to person, place, and time. She appears well-developed and well-nourished. No distress.  HENT:  Head: Normocephalic and atraumatic.  Neck: Normal range of motion. Neck supple.  Cardiovascular: Normal rate and regular rhythm.  Exam reveals no gallop and no friction rub.   No murmur heard. Pulmonary/Chest: Effort normal and breath sounds normal. No respiratory distress. She has no wheezes.  Abdominal: Soft. Bowel sounds are normal. She exhibits no distension. There is no tenderness.  Musculoskeletal: Normal range of motion.  The right arm is noted to have a contracture of the hand muscles. Ulnar and radial pulses are palpable and capillary refill is brisk.  Neurological: She is alert and oriented to person, place, and time.  Patient has a right-sided hemiparesis. There is a facial droop, and right arm and right leg are basically flaccid.  Skin: Skin is warm and dry. She is not diaphoretic.  Nursing note and vitals reviewed.    ED Treatments / Results  Labs (all labs ordered are listed, but only abnormal results are displayed) Labs Reviewed - No data to display  EKG  EKG Interpretation None       Radiology No results found.  Procedures Procedures (including critical care time)  Medications Ordered in ED Medications  fentaNYL (SUBLIMAZE) injection 75 mcg (not administered)     Initial Impression / Assessment and Plan / ED Course  I have reviewed the triage vital signs and the nursing notes.  Pertinent labs & imaging results that were available during my care of the patient were reviewed by me and  considered in my medical decision making (see chart for details).  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 She has not seen a neurologist since being discharged from the hospital. She will be given fentanyl here in the ED as this has helped her in the past. She will be discharged with tramadol and follow-up with neurology.  Final Clinical Impressions(s) / ED Diagnoses   Final diagnoses:  None    New Prescriptions New Prescriptions   No medications on file     Veryl Speak, MD 11/19/16 [phone]

## 2016-11-22 ENCOUNTER — Encounter (HOSPITAL_BASED_OUTPATIENT_CLINIC_OR_DEPARTMENT_OTHER): Payer: Self-pay | Admitting: Emergency Medicine

## 2016-11-22 ENCOUNTER — Emergency Department (HOSPITAL_BASED_OUTPATIENT_CLINIC_OR_DEPARTMENT_OTHER)
Admission: EM | Admit: 2016-11-22 | Discharge: 2016-11-22 | Disposition: A | Discharge status: Home / Self Care | Payer: Medicaid Other | Attending: Emergency Medicine | Admitting: Emergency Medicine

## 2016-11-22 DIAGNOSIS — D0590 Unspecified type of carcinoma in situ of unspecified breast: Secondary | ICD-10-CM | POA: Diagnosis not present

## 2016-11-22 DIAGNOSIS — Z79899 Other long term (current) drug therapy: Secondary | ICD-10-CM | POA: Insufficient documentation

## 2016-11-22 DIAGNOSIS — I2510 Atherosclerotic heart disease of native coronary artery without angina pectoris: Secondary | ICD-10-CM | POA: Insufficient documentation

## 2016-11-22 DIAGNOSIS — F17210 Nicotine dependence, cigarettes, uncomplicated: Secondary | ICD-10-CM | POA: Diagnosis not present

## 2016-11-22 DIAGNOSIS — Z8673 Personal history of transient ischemic attack (TIA), and cerebral infarction without residual deficits: Secondary | ICD-10-CM | POA: Diagnosis not present

## 2016-11-22 DIAGNOSIS — Z7982 Long term (current) use of aspirin: Secondary | ICD-10-CM | POA: Diagnosis not present

## 2016-11-22 DIAGNOSIS — E119 Type 2 diabetes mellitus without complications: Secondary | ICD-10-CM | POA: Insufficient documentation

## 2016-11-22 DIAGNOSIS — Z794 Long term (current) use of insulin: Secondary | ICD-10-CM | POA: Insufficient documentation

## 2016-11-22 DIAGNOSIS — Z955 Presence of coronary angioplasty implant and graft: Secondary | ICD-10-CM | POA: Diagnosis not present

## 2016-11-22 DIAGNOSIS — I10 Essential (primary) hypertension: Secondary | ICD-10-CM | POA: Insufficient documentation

## 2016-11-22 DIAGNOSIS — E039 Hypothyroidism, unspecified: Secondary | ICD-10-CM | POA: Diagnosis not present

## 2016-11-22 DIAGNOSIS — Z7902 Long term (current) use of antithrombotics/antiplatelets: Secondary | ICD-10-CM | POA: Insufficient documentation

## 2016-11-22 DIAGNOSIS — M79601 Pain in right arm: Secondary | ICD-10-CM | POA: Diagnosis not present

## 2016-11-22 DIAGNOSIS — I252 Old myocardial infarction: Secondary | ICD-10-CM | POA: Insufficient documentation

## 2016-11-22 DIAGNOSIS — G8191 Hemiplegia, unspecified affecting right dominant side: Secondary | ICD-10-CM

## 2016-11-22 HISTORY — DX: Other chronic pain: G89.29

## 2016-11-22 HISTORY — DX: Chronic pain syndrome: G89.4

## 2016-11-22 HISTORY — DX: Pain in arm, unspecified: M79.603

## 2016-11-22 LAB — CBG MONITORING, ED: GLUCOSE-CAPILLARY: 146 mg/dL — AB (ref 65–99)

## 2016-11-22 MED ORDER — FENTANYL CITRATE (PF) 100 MCG/2ML IJ SOLN: 50.0000 ug | Freq: Once | INTRAMUSCULAR | Status: DC

## 2016-11-22 MED ORDER — FENTANYL CITRATE (PF) 100 MCG/2ML IJ SOLN
50.0000 ug | Freq: Once | INTRAMUSCULAR | Status: AC
  Administered 2016-11-22: 50 ug via INTRAVENOUS
  Filled 2016-11-22: qty 2

## 2016-11-22 NOTE — ED Notes (Signed)
Pt reported her blood sugar felt low, CBG checked and 143 at this time. Pt has no other symptoms.

## 2016-11-22 NOTE — ED Triage Notes (Signed)
Patient has chronic pain to her right arm since February. Patient comes in via EMS today with the same complaint and would like some more pain medication

## 2016-11-22 NOTE — Discharge Instructions (Addendum)
Continue with tramadol as needed for pain. Please follow up with your primary care doctor and a neurologist. Please call both [facility] and [facility] to see if you can establish with either as a new patient.

## 2016-11-22 NOTE — ED Notes (Signed)
Patient asked to see a RN in the waiting room. Reassessed the pain and patient reports that she is hurting worse. Explained to patient about the wait and that we were working hard to get her back. Explained the same to family member who is with the patient

## 2016-11-22 NOTE — ED Provider Notes (Signed)
[place] DEPT [facility] Provider Note   CSN: [id number] Arrival date & time: 11/22/16  1753     History   Chief Complaint Chief Complaint  Patient presents with  . Arm Pain    HPI Laura Oconnor is a 61 y.o. female.  Presents to ED with complaint of R arm pain. States has had constant severe R arm pain since CVA in February. Worse today than usual after working with OT and therefore requesting pain medications. Similar to when she was in the ED 11/19/16. No new injury. No CP or SOB.       Past Medical History:  Diagnosis Date  . Chronic arm pain   . Chronic pain syndrome   . Diabetes mellitus   . Heart attack ([place])   . History of right coronary artery stent placement    2 stents placed at separate times  . Hypertension   . Stroke ([place])   . Thyroid disease     Patient Active Problem List   Diagnosis Date Noted  . Hypokalemia 08/13/2014  . Diarrhea 08/13/2014  . Loss of weight 08/13/2014  . Hypocalcemia 08/13/2014  . Atrophic vaginitis 08/13/2014  . Type 2 diabetes mellitus, uncontrolled ([place]) 08/13/2014  . Breast cancer ([place]) 08/13/2014  . Hypothyroidism 08/13/2014  . CAD (coronary artery disease) 08/13/2014    Past Surgical History:  Procedure Laterality Date  . ABDOMINAL HYSTERECTOMY     patient denies  . BLADDER SUSPENSION    . LIPOMA EXCISION      OB History    No data available       Home Medications    Prior to Admission medications   Medication Sig Start Date End Date Taking? Authorizing Provider  aspirin 81 MG chewable tablet Chew 81 mg by mouth daily.      [provider]  atorvastatin (LIPITOR) 10 MG tablet Take 10 mg by mouth daily.      [provider]  clobetasol cream (TEMOVATE) 8.67 % Apply 1 application topically 2 (two) times daily.    [provider]  clopidogrel (PLAVIX) 75 MG tablet Take 75 mg by mouth daily.    [provider]  ferrous sulfate 325 (65 FE) MG tablet Take 1 tablet (325 mg total)  by mouth 2 (two) times daily with a meal. 08/14/14   Thurnell Lose, MD  gabapentin (NEURONTIN) 300 MG capsule Take 300 mg by mouth at bedtime.     [provider]  insulin glargine (LANTUS) 100 UNIT/ML injection Inject 0.75 mLs (75 Units total) into the skin at bedtime. 08/14/14   Thurnell Lose, MD  levothyroxine (SYNTHROID, LEVOTHROID) 125 MCG tablet Take 125 mcg by mouth at bedtime.     [provider]  metFORMIN (GLUCOPHAGE) 500 MG tablet Take 500 mg by mouth daily with breakfast.    [provider]  metoprolol succinate (TOPROL-XL) 25 MG 24 hr tablet Take 25 mg by mouth at bedtime.     [provider]  polyethylene glycol (MIRALAX) packet Take 17 g by mouth daily. 09/25/16   Long, Wonda Olds, MD  Spacer/Aero-Holding Chambers (AEROCHAMBER PLUS WITH MASK) inhaler Use as instructed 04/22/12   Riki Altes, MD  traMADol (ULTRAM) 50 MG tablet Take 1 tablet (50 mg total) by mouth every 6 (six) hours as needed. 11/19/16   Veryl Speak, MD  triamcinolone (KENALOG) 0.025 % cream Apply 1 application topically 2 (two) times daily.    [provider]    Family History Family  History  Problem Relation Age of Onset  . Diabetes Father   . Diabetes Sister   . Diabetes Brother     Social History Social History  Substance Use Topics  . Smoking status: Current Every Day Smoker    Packs/day: 0.50    Types: Cigarettes  . Smokeless tobacco: Never Used  . Alcohol use No     Allergies   Amoxicillin; Morphine and related; Penicillins; Percocet [oxycodone-acetaminophen]; Percodan [oxycodone-aspirin]; Sulfa antibiotics; and Torecan [thiethylperazine]   Review of Systems Review of Systems  Constitutional: Negative for chills and fever.  Respiratory: Negative for shortness of breath.   Cardiovascular: Negative for chest pain.  Gastrointestinal: Negative for abdominal pain, diarrhea, nausea and vomiting.  Musculoskeletal:       R arm pain  Neurological:  Negative for headaches.     Physical Exam Updated Vital Signs BP (!) 164/69   Pulse (!) 111   Temp 98.2 F (36.8 C) (Oral)   Resp 17   Ht 4\' 11"  (1.499 m)   Wt 48.5 kg (107 lb)   SpO2 99%   BMI 21.61 kg/m   Physical Exam  Constitutional: She is oriented to person, place, and time. She appears well-developed and well-nourished. No distress.  HENT:  Head: Normocephalic and atraumatic.  Eyes: Conjunctivae are normal.  Neck: Normal range of motion.  Cardiovascular: Normal rate, regular rhythm, normal heart sounds and intact distal pulses.   Pulmonary/Chest: Effort normal and breath sounds normal.  Abdominal: Soft. Bowel sounds are normal.  Musculoskeletal:  R arm contracted and held close to body, diminished bulk and tone.  Neurological: She is alert and oriented to person, place, and time.  R sided facial droop and R arm as per above. R leg also weak with diminished bulk and tone  Skin: Skin is warm and dry.     ED Treatments / Results  Labs (all labs ordered are listed, but only abnormal results are displayed) Labs Reviewed  CBG MONITORING, ED    EKG  EKG Interpretation None       Radiology No results found.  Procedures Procedures (including critical care time)  Medications Ordered in ED Medications  fentaNYL (SUBLIMAZE) injection 50 mcg (50 mcg Intravenous Given 11/22/16 2131)     Initial Impression / Assessment and Plan / ED Course  I have reviewed the triage vital signs and the nursing notes.  Pertinent labs & imaging results that were available during my care of the patient were reviewed by me and considered in my medical decision making (see chart for details).   Patient in the ED repeatedly with chronic R arm pain likely neuropathic from CVA in February. Patient has yet to see a neurologist and has not seen her PCP recently. Given fentanyl with relief and discharged with instructions to establish with a neurologist, contact information given, and to  follow up with her PCP.   Final Clinical Impressions(s) / ED Diagnoses   Final diagnoses:  Right arm pain  History of CVA (cerebrovascular accident)  Right hemiparesis [facility])    New Prescriptions Discharge Medication List as of 11/22/2016  9:48 PM       Bufford Lope, DO 11/22/16 2340    Tegeler, Gwenyth Allegra, MD 11/24/16 2109

## 2016-11-25 ENCOUNTER — Encounter (HOSPITAL_BASED_OUTPATIENT_CLINIC_OR_DEPARTMENT_OTHER): Payer: Self-pay | Admitting: *Deleted

## 2016-11-25 ENCOUNTER — Emergency Department (HOSPITAL_BASED_OUTPATIENT_CLINIC_OR_DEPARTMENT_OTHER)
Admission: EM | Admit: 2016-11-25 | Discharge: 2016-11-25 | Disposition: A | Discharge status: Home / Self Care | Payer: Medicaid Other | Attending: Emergency Medicine | Admitting: Emergency Medicine

## 2016-11-25 ENCOUNTER — Emergency Department (HOSPITAL_BASED_OUTPATIENT_CLINIC_OR_DEPARTMENT_OTHER): Payer: Medicaid Other

## 2016-11-25 DIAGNOSIS — I252 Old myocardial infarction: Secondary | ICD-10-CM | POA: Insufficient documentation

## 2016-11-25 DIAGNOSIS — Z7984 Long term (current) use of oral hypoglycemic drugs: Secondary | ICD-10-CM | POA: Diagnosis not present

## 2016-11-25 DIAGNOSIS — Z7982 Long term (current) use of aspirin: Secondary | ICD-10-CM | POA: Diagnosis not present

## 2016-11-25 DIAGNOSIS — I2510 Atherosclerotic heart disease of native coronary artery without angina pectoris: Secondary | ICD-10-CM | POA: Insufficient documentation

## 2016-11-25 DIAGNOSIS — R404 Transient alteration of awareness: Secondary | ICD-10-CM

## 2016-11-25 DIAGNOSIS — E039 Hypothyroidism, unspecified: Secondary | ICD-10-CM | POA: Diagnosis not present

## 2016-11-25 DIAGNOSIS — Z853 Personal history of malignant neoplasm of breast: Secondary | ICD-10-CM | POA: Insufficient documentation

## 2016-11-25 DIAGNOSIS — Z7902 Long term (current) use of antithrombotics/antiplatelets: Secondary | ICD-10-CM | POA: Diagnosis not present

## 2016-11-25 DIAGNOSIS — R4182 Altered mental status, unspecified: Secondary | ICD-10-CM | POA: Diagnosis present

## 2016-11-25 DIAGNOSIS — F17210 Nicotine dependence, cigarettes, uncomplicated: Secondary | ICD-10-CM | POA: Insufficient documentation

## 2016-11-25 DIAGNOSIS — E119 Type 2 diabetes mellitus without complications: Secondary | ICD-10-CM | POA: Diagnosis not present

## 2016-11-25 DIAGNOSIS — I10 Essential (primary) hypertension: Secondary | ICD-10-CM | POA: Insufficient documentation

## 2016-11-25 LAB — CBC
HCT: 31.4 % — ABNORMAL LOW (ref 36.0–46.0)
Hemoglobin: 9.9 g/dL — ABNORMAL LOW (ref 12.0–15.0)
MCH: 24.6 pg — AB (ref 26.0–34.0)
MCHC: 31.5 g/dL (ref 30.0–36.0)
MCV: 78.1 fL (ref 78.0–100.0)
Platelets: 366 10*3/uL (ref 150–400)
RBC: 4.02 MIL/uL (ref 3.87–5.11)
RDW: 17 % — ABNORMAL HIGH (ref 11.5–15.5)
WBC: 7.8 10*3/uL (ref 4.0–10.5)

## 2016-11-25 LAB — COMPREHENSIVE METABOLIC PANEL
ALBUMIN: 3.7 g/dL (ref 3.5–5.0)
ALK PHOS: 141 U/L — AB (ref 38–126)
ALT: 7 U/L — ABNORMAL LOW (ref 14–54)
ANION GAP: 10 (ref 5–15)
AST: 15 U/L (ref 15–41)
BUN: 8 mg/dL (ref 6–20)
CALCIUM: 9.7 mg/dL (ref 8.9–10.3)
CO2: 26 mmol/L (ref 22–32)
Chloride: 104 mmol/L (ref 101–111)
Creatinine, Ser: 0.38 mg/dL — ABNORMAL LOW (ref 0.44–1.00)
GFR calc Af Amer: >60 mL/min (ref >60)
GFR calc non Af Amer: >60 mL/min (ref >60)
GLUCOSE: 194 mg/dL — AB (ref 65–99)
Potassium: 3.7 mmol/L (ref 3.5–5.1)
SODIUM: 140 mmol/L (ref 135–145)
Total Bilirubin: 0.7 mg/dL (ref 0.3–1.2)
Total Protein: 7.6 g/dL (ref 6.5–8.1)

## 2016-11-25 LAB — RAPID URINE DRUG SCREEN, HOSP PERFORMED
AMPHETAMINES: NOT DETECTED
BARBITURATES: NOT DETECTED
Benzodiazepines: NOT DETECTED
COCAINE: NOT DETECTED
OPIATES: NOT DETECTED
TETRAHYDROCANNABINOL: NOT DETECTED

## 2016-11-25 LAB — ETHANOL: Alcohol, Ethyl (B): <5 mg/dL (ref <5)

## 2016-11-25 LAB — AMMONIA: Ammonia: 18 umol/L (ref 9–35)

## 2016-11-25 LAB — CBG MONITORING, ED: GLUCOSE-CAPILLARY: 185 mg/dL — AB (ref 65–99)

## 2016-11-25 NOTE — ED Triage Notes (Signed)
C/o being lethargic and sleepy onset yesterday after taking 5 mg buspirone pt is very sleepy will response to verbal stimulation

## 2016-11-25 NOTE — ED Notes (Signed)
Pt alert, awake talking and smiling

## 2016-11-25 NOTE — ED Notes (Signed)
Patient transported to CT 

## 2016-11-25 NOTE — ED Provider Notes (Signed)
[place] DEPT [facility] Provider Note   CSN: [id number] Arrival date & time: 11/25/16  5284     History   Chief Complaint Chief Complaint  Patient presents with  . Altered Mental Status    HPI Laura Oconnor is a 61 y.o. female.  HPI Patient is brought to the emergency department by her son after ongoing increased sleep today.  She took her first dose of buspirone today and the son reports that she slept for 4-5 hours after taking this dose.  He awoke her for dinner which point she did get through dinner but then immediately went back to bed again.  She has been slightly weaker.  She has a history of prior stroke with right-sided weakness and normally ambulates with a walker but requires some additional assistance today.  She is brought to the ER for evaluation.  Patient is able to wake up and speak and talk and answer simple questions.  She has no complaints at this time.  She denies headache chest pain abdominal pain.  No other new medications.  No reports of drugs or alcohol.  No history of liver disease.  Patient reports she feels fine but that she is "tired".   Past Medical History:  Diagnosis Date  . Chronic arm pain   . Chronic pain syndrome   . Diabetes mellitus   . Heart attack ([place])   . History of right coronary artery stent placement    2 stents placed at separate times  . Hypertension   . Stroke ([place])   . Thyroid disease     Patient Active Problem List   Diagnosis Date Noted  . Hypokalemia 08/13/2014  . Diarrhea 08/13/2014  . Loss of weight 08/13/2014  . Hypocalcemia 08/13/2014  . Atrophic vaginitis 08/13/2014  . Type 2 diabetes mellitus, uncontrolled ([place]) 08/13/2014  . Breast cancer ([place]) 08/13/2014  . Hypothyroidism 08/13/2014  . CAD (coronary artery disease) 08/13/2014    Past Surgical History:  Procedure Laterality Date  . ABDOMINAL HYSTERECTOMY     patient denies  . BLADDER SUSPENSION    . LIPOMA EXCISION      OB History    No data available         Home Medications    Prior to Admission medications   Medication Sig Start Date End Date Taking? Authorizing Provider  aspirin 81 MG chewable tablet Chew 81 mg by mouth daily.      [provider]  atorvastatin (LIPITOR) 10 MG tablet Take 10 mg by mouth daily.      [provider]  clobetasol cream (TEMOVATE) 1.32 % Apply 1 application topically 2 (two) times daily.    [provider]  clopidogrel (PLAVIX) 75 MG tablet Take 75 mg by mouth daily.    [provider]  ferrous sulfate 325 (65 FE) MG tablet Take 1 tablet (325 mg total) by mouth 2 (two) times daily with a meal. 08/14/14   Thurnell Lose, MD  gabapentin (NEURONTIN) 300 MG capsule Take 300 mg by mouth at bedtime.     [provider]  insulin glargine (LANTUS) 100 UNIT/ML injection Inject 0.75 mLs (75 Units total) into the skin at bedtime. 08/14/14   Thurnell Lose, MD  levothyroxine (SYNTHROID, LEVOTHROID) 125 MCG tablet Take 125 mcg by mouth at bedtime.     [provider]  metFORMIN (GLUCOPHAGE) 500 MG tablet Take 500 mg by mouth daily with breakfast.    [provider]  metoprolol succinate (TOPROL-XL)  25 MG 24 hr tablet Take 25 mg by mouth at bedtime.     [provider]  polyethylene glycol (MIRALAX) packet Take 17 g by mouth daily. 09/25/16   Long, Wonda Olds, MD  Spacer/Aero-Holding Chambers (AEROCHAMBER PLUS WITH MASK) inhaler Use as instructed 04/22/12   Riki Altes, MD  traMADol (ULTRAM) 50 MG tablet Take 1 tablet (50 mg total) by mouth every 6 (six) hours as needed. 11/19/16   Veryl Speak, MD  triamcinolone (KENALOG) 0.025 % cream Apply 1 application topically 2 (two) times daily.    [provider]    Family History Family History  Problem Relation Age of Onset  . Diabetes Father   . Diabetes Sister   . Diabetes Brother     Social History Social History  Substance Use Topics  . Smoking status: Current Every Day Smoker     Packs/day: 0.50    Types: Cigarettes  . Smokeless tobacco: Never Used  . Alcohol use No     Allergies   Amoxicillin; Morphine and related; Penicillins; Percocet [oxycodone-acetaminophen]; Percodan [oxycodone-aspirin]; Sulfa antibiotics; and Torecan [thiethylperazine]   Review of Systems Review of Systems  All other systems reviewed and are negative.    Physical Exam Updated Vital Signs BP (!) 156/72   Pulse (!) 102   Temp 98.2 F (36.8 C) (Oral)   Resp 16   Ht 4\' 11"  (1.499 m)   Wt 48.5 kg (107 lb)   SpO2 100%   BMI 21.61 kg/m   Physical Exam  Constitutional: She is oriented to person, place, and time. She appears well-developed and well-nourished. No distress.  HENT:  Head: Normocephalic and atraumatic.  Eyes: Pupils are equal, round, and reactive to light. EOM are normal.  Neck: Normal range of motion.  Cardiovascular: Normal rate, regular rhythm and normal heart sounds.   Pulmonary/Chest: Effort normal and breath sounds normal.  Abdominal: Soft. She exhibits no distension. There is no tenderness.  Musculoskeletal: Normal range of motion.  Neurological: She is alert and oriented to person, place, and time.  5/5 strength in major muscle groups of  Left upper and lower extremities.  Right upper and right lower extremity weakness consistent with baseline person.  Speech normal.  Right-sided facial droop   Skin: Skin is warm and dry.  Psychiatric: She has a normal mood and affect. Judgment normal.  Nursing note and vitals reviewed.    ED Treatments / Results  Labs (all labs ordered are listed, but only abnormal results are displayed) Labs Reviewed  CBC - Abnormal; Notable for the following:       Result Value   Hemoglobin 9.9 (*)    HCT 31.4 (*)    MCH 24.6 (*)    RDW 17.0 (*)    All other components within normal limits  COMPREHENSIVE METABOLIC PANEL - Abnormal; Notable for the following:    Glucose, Bld 194 (*)    Creatinine, Ser 0.38 (*)    ALT 7 (*)     Alkaline Phosphatase 141 (*)    All other components within normal limits  CBG MONITORING, ED - Abnormal; Notable for the following:    Glucose-Capillary 185 (*)    All other components within normal limits  AMMONIA  ETHANOL  RAPID URINE DRUG SCREEN, HOSP PERFORMED    EKG  EKG Interpretation  Date/Time:  Saturday November 25 2016 03:50:28 EDT Ventricular Rate:  107 PR Interval:    QRS Duration: 93 QT Interval:  364 QTC Calculation: 486 R Axis:  10 Text Interpretation:  Sinus tachycardia Ventricular premature complex Probable left atrial enlargement Probable left ventricular hypertrophy Borderline prolonged QT interval Baseline wander in lead(s) V1 No significant change was found Confirmed by Jola Schmidt [id number]) on 11/25/2016 5:21:31 AM       Radiology Ct Head Wo Contrast  Result Date: 11/25/2016 CLINICAL DATA:  Acute onset of altered level of consciousness. Lethargy and sleepiness. Initial encounter. EXAM: CT HEAD WITHOUT CONTRAST TECHNIQUE: Contiguous axial images were obtained from the base of the skull through the vertex without intravenous contrast. COMPARISON:  CT of the head performed 11/11/2016, and MRI of the brain performed 05/29/2016 FINDINGS: Brain: No evidence of acute infarction, hemorrhage, hydrocephalus, extra-axial collection or mass lesion/mass effect. A small chronic infarct is noted at the left mid corona radiata, extending inferiorly to the left cerebral peduncle. The brainstem and fourth ventricle are within normal limits. The cerebral hemispheres demonstrate grossly normal Elizarraraz-white differentiation. No mass effect or midline shift is seen. Vascular: No hyperdense vessel or unexpected calcification. Skull: There is no evidence of fracture; visualized osseous structures are unremarkable in appearance. Sinuses/Orbits: The visualized portions of the orbits are within normal limits. The paranasal sinuses and mastoid air cells are well-aerated. Other: No significant soft  tissue abnormalities are seen. IMPRESSION: 1. No acute intracranial pathology seen on CT. 2. Small chronic infarct at the left mid corona radiata, extending inferiorly to the left cerebral peduncle. Electronically Signed   By: Garald Balding M.D.   On: 11/25/2016 05:10    Procedures Procedures (including critical care time)  Medications Ordered in ED Medications - No data to display   Initial Impression / Assessment and Plan / ED Course  I have reviewed the triage vital signs and the nursing notes.  Pertinent labs & imaging results that were available during my care of the patient were reviewed by me and considered in my medical decision making (see chart for details).     5:45 AM Patient is much improved at this time.  She is alert and oriented 3.  She has no complaints.  Son agrees that the patient is returned to baseline mental status.  Her labs and head CT are without abnormality.  Her urine drug screen and alcohol are negative.  No indication for additional workup or management at this time.  Returned to baseline mental status.  Discharge home in good condition.  Close primary care follow-up.  Patient and son understand to return to the ER for new or worsening symptoms  Final Clinical Impressions(s) / ED Diagnoses   Final diagnoses:  Transient alteration of awareness    New Prescriptions New Prescriptions   No medications on file     Jola Schmidt, MD 11/25/16 [phone]

## 2016-12-23 ENCOUNTER — Emergency Department (HOSPITAL_BASED_OUTPATIENT_CLINIC_OR_DEPARTMENT_OTHER)
Admission: EM | Admit: 2016-12-23 | Discharge: 2016-12-23 | Disposition: A | Discharge status: Home / Self Care | Payer: Medicaid Other | Attending: Emergency Medicine | Admitting: Emergency Medicine

## 2016-12-23 ENCOUNTER — Emergency Department (HOSPITAL_BASED_OUTPATIENT_CLINIC_OR_DEPARTMENT_OTHER): Payer: Medicaid Other

## 2016-12-23 ENCOUNTER — Encounter (HOSPITAL_BASED_OUTPATIENT_CLINIC_OR_DEPARTMENT_OTHER): Payer: Self-pay | Admitting: Emergency Medicine

## 2016-12-23 DIAGNOSIS — Z79899 Other long term (current) drug therapy: Secondary | ICD-10-CM | POA: Insufficient documentation

## 2016-12-23 DIAGNOSIS — Y929 Unspecified place or not applicable: Secondary | ICD-10-CM | POA: Insufficient documentation

## 2016-12-23 DIAGNOSIS — Y939 Activity, unspecified: Secondary | ICD-10-CM | POA: Diagnosis not present

## 2016-12-23 DIAGNOSIS — Y999 Unspecified external cause status: Secondary | ICD-10-CM | POA: Insufficient documentation

## 2016-12-23 DIAGNOSIS — E119 Type 2 diabetes mellitus without complications: Secondary | ICD-10-CM | POA: Diagnosis not present

## 2016-12-23 DIAGNOSIS — I10 Essential (primary) hypertension: Secondary | ICD-10-CM | POA: Diagnosis not present

## 2016-12-23 DIAGNOSIS — Z7902 Long term (current) use of antithrombotics/antiplatelets: Secondary | ICD-10-CM | POA: Diagnosis not present

## 2016-12-23 DIAGNOSIS — M25511 Pain in right shoulder: Secondary | ICD-10-CM

## 2016-12-23 DIAGNOSIS — S43001D Unspecified subluxation of right shoulder joint, subsequent encounter: Secondary | ICD-10-CM | POA: Diagnosis not present

## 2016-12-23 DIAGNOSIS — F17210 Nicotine dependence, cigarettes, uncomplicated: Secondary | ICD-10-CM | POA: Diagnosis not present

## 2016-12-23 DIAGNOSIS — X58XXXA Exposure to other specified factors, initial encounter: Secondary | ICD-10-CM | POA: Diagnosis not present

## 2016-12-23 DIAGNOSIS — Z7984 Long term (current) use of oral hypoglycemic drugs: Secondary | ICD-10-CM | POA: Insufficient documentation

## 2016-12-23 DIAGNOSIS — Z7982 Long term (current) use of aspirin: Secondary | ICD-10-CM | POA: Diagnosis not present

## 2016-12-23 DIAGNOSIS — G8929 Other chronic pain: Secondary | ICD-10-CM | POA: Insufficient documentation

## 2016-12-23 DIAGNOSIS — S4991XA Unspecified injury of right shoulder and upper arm, initial encounter: Secondary | ICD-10-CM | POA: Diagnosis present

## 2016-12-23 MED ORDER — FENTANYL CITRATE (PF) 100 MCG/2ML IJ SOLN
50.0000 ug | Freq: Once | INTRAMUSCULAR | Status: AC
  Administered 2016-12-23: 50 ug via INTRAMUSCULAR
  Filled 2016-12-23: qty 2

## 2016-12-23 NOTE — ED Notes (Signed)
ED Provider at bedside. 

## 2016-12-23 NOTE — ED Provider Notes (Signed)
[facility] DEPT [facility] Provider Note   CSN: [id number] Arrival date & time: 12/23/16  0205     History   Chief Complaint Chief Complaint  Patient presents with  . Shoulder Pain    HPI Laura Oconnor is a 61 y.o. female.  The history is provided by the patient.  Shoulder Pain   This is a recurrent problem. The current episode started more than 2 days ago. The problem occurs daily. The problem has been gradually worsening. The pain is present in the right shoulder. The pain is moderate. Associated symptoms include stiffness. The symptoms are aggravated by contact. She has tried nothing for the symptoms. There has been no history of extremity trauma.  pt reports recurrent right shoulder pain Denies trauma She has h/o stroke with RUE weakness She has had pain in right shoulder previously but does not recall having evaluation by orthopedist  Past Medical History:  Diagnosis Date  . Chronic arm pain   . Chronic pain syndrome   . Diabetes mellitus   . Heart attack ([place])   . History of right coronary artery stent placement    2 stents placed at separate times  . Hypertension   . Stroke ([place])   . Thyroid disease     Patient Active Problem List   Diagnosis Date Noted  . Hypokalemia 08/13/2014  . Diarrhea 08/13/2014  . Loss of weight 08/13/2014  . Hypocalcemia 08/13/2014  . Atrophic vaginitis 08/13/2014  . Type 2 diabetes mellitus, uncontrolled ([place]) 08/13/2014  . Breast cancer ([place]) 08/13/2014  . Hypothyroidism 08/13/2014  . CAD (coronary artery disease) 08/13/2014    Past Surgical History:  Procedure Laterality Date  . ABDOMINAL HYSTERECTOMY     patient denies  . BLADDER SUSPENSION    . LIPOMA EXCISION      OB History    No data available       Home Medications    Prior to Admission medications   Medication Sig Start Date End Date Taking? Authorizing Provider  aspirin 81 MG chewable tablet Chew 81 mg by mouth daily.      [provider]    atorvastatin (LIPITOR) 10 MG tablet Take 10 mg by mouth daily.      [provider]  clobetasol cream (TEMOVATE) 4.94 % Apply 1 application topically 2 (two) times daily.    [provider]  clopidogrel (PLAVIX) 75 MG tablet Take 75 mg by mouth daily.    [provider]  ferrous sulfate 325 (65 FE) MG tablet Take 1 tablet (325 mg total) by mouth 2 (two) times daily with a meal. 08/14/14   Thurnell Lose, MD  gabapentin (NEURONTIN) 300 MG capsule Take 300 mg by mouth at bedtime.     [provider]  insulin glargine (LANTUS) 100 UNIT/ML injection Inject 0.75 mLs (75 Units total) into the skin at bedtime. 08/14/14   Thurnell Lose, MD  levothyroxine (SYNTHROID, LEVOTHROID) 125 MCG tablet Take 125 mcg by mouth at bedtime.     [provider]  metFORMIN (GLUCOPHAGE) 500 MG tablet Take 500 mg by mouth daily with breakfast.    [provider]  metoprolol succinate (TOPROL-XL) 25 MG 24 hr tablet Take 25 mg by mouth at bedtime.     [provider]  polyethylene glycol (MIRALAX) packet Take 17 g by mouth daily. 09/25/16   Long, Wonda Olds, MD  Spacer/Aero-Holding Chambers (AEROCHAMBER PLUS WITH MASK) inhaler Use as instructed 04/22/12   Riki Altes, MD  traMADol (ULTRAM) 50 MG tablet Take 1 tablet (50 mg total) by mouth every 6 (six) hours as needed. 11/19/16   Veryl Speak, MD  triamcinolone (KENALOG) 0.025 % cream Apply 1 application topically 2 (two) times daily.    [provider]    Family History Family History  Problem Relation Age of Onset  . Diabetes Father   . Diabetes Sister   . Diabetes Brother     Social History Social History  Substance Use Topics  . Smoking status: Current Every Day Smoker    Packs/day: 0.50    Types: Cigarettes  . Smokeless tobacco: Never Used  . Alcohol use No     Allergies   Amoxicillin; Morphine and related; Penicillins; Percocet [oxycodone-acetaminophen]; Percodan  [oxycodone-aspirin]; Sulfa antibiotics; and Torecan [thiethylperazine]   Review of Systems Review of Systems  Constitutional: Negative for fever.  Cardiovascular: Negative for chest pain.  Gastrointestinal: Negative for abdominal pain.  Musculoskeletal: Positive for arthralgias and stiffness.  Neurological:       Chronic arm weakness  All other systems reviewed and are negative.    Physical Exam Updated Vital Signs BP (!) 131/52 (BP Location: Right Arm)   Pulse 76   Temp 98.3 F (36.8 C) (Oral)   Resp 18   Ht 1.499 m (4\' 11" )   Wt 48.5 kg (107 lb)   SpO2 100%   BMI 21.61 kg/m   Physical Exam CONSTITUTIONAL: elderly HEAD: Normocephalic/atraumatic EYES: EOMI ENMT: Mucous membranes moist NECK: supple no meningeal signs CV: S1/S2 noted, no murmurs/rubs/gallops noted LUNGS: Lungs are clear to auscultation bilaterally ABDOMEN: soft, nontender NEURO: Pt is awake/alert/appropriate, right facial droop (chronic) Right arm paresis (chronic) EXTREMITIES: pulses normal/equal, tenderness to right shoulder No erythema noted.  No bruising or visible trauma Distal pulses intact SKIN: warm, color normal PSYCH: no abnormalities of mood noted, alert and oriented to situation   ED Treatments / Results  Labs (all labs ordered are listed, but only abnormal results are displayed) Labs Reviewed - No data to display  EKG  EKG Interpretation None       Radiology Dg Shoulder Right  Result Date: 12/23/2016 CLINICAL DATA:  Right shoulder pain for 3 days. Decreased range of motion. EXAM: RIGHT SHOULDER - 2+ VIEW COMPARISON:  Radiographs 09/25/2016.  08/24/2016, 07/25/2016 FINDINGS: No acute fracture. Chronic inferior subluxation of the humeral head, unchanged from prior exam. No bony destructive change. Acromioclavicular joint is congruent. IMPRESSION: Chronic inferior subluxation of the humeral head, unchanged from prior exam. This can be seen in the setting of underlying joint  effusion. No acute abnormality. Electronically Signed   By: Jeb Levering M.D.   On: 12/23/2016 02:54    Procedures Procedures (including critical care time)  Medications Ordered in ED Medications  fentaNYL (SUBLIMAZE) injection 50 mcg (50 mcg Intramuscular Given 12/23/16 0600)     Initial Impression / Assessment and Plan / ED Course  I have reviewed the triage vital signs and the nursing notes.  Pertinent  imaging results that were available during my care of the patient were reviewed by me and considered in my medical decision making (see chart for details).     Pt here for recurrent right shoulder pain She has been seen for this previously She has chronic subluxation to right shoulder, I have reviewed previous imaging and this has been confirmed on multiple xrays She reports she was unaware of any subluxation.  She has done PT before but had to stop due to pain She reports she  does not have orthopedist She reports due to allergies all that works for her is fentanyl I advised that she will need have outpatient followup and management of her pain as this becoming a recurrent issue Given referral to sports med  Final Clinical Impressions(s) / ED Diagnoses   Final diagnoses:  Acute pain of right shoulder  Subluxation of right shoulder joint, subsequent encounter    New Prescriptions Discharge Medication List as of 12/23/2016  6:23 AM       Ripley Fraise, MD 12/23/16 [phone]

## 2016-12-23 NOTE — ED Triage Notes (Signed)
Pt c/o right shoulder pain worsening over last 2 days. Pt is flaccid on right side from prior stroke. Pt denies any fall or known injury. Pt is wearing a sling from home.

## 2016-12-26 ENCOUNTER — Ambulatory Visit: Payer: Medicaid Other | Admitting: Family Medicine

## 2016-12-31 ENCOUNTER — Encounter (HOSPITAL_COMMUNITY): Payer: Self-pay | Admitting: Emergency Medicine

## 2016-12-31 ENCOUNTER — Emergency Department (HOSPITAL_COMMUNITY)
Admission: EM | Admit: 2016-12-31 | Discharge: 2017-01-01 | Disposition: A | Discharge status: Home / Self Care | Payer: Medicaid Other | Attending: Emergency Medicine | Admitting: Emergency Medicine

## 2016-12-31 DIAGNOSIS — Z8673 Personal history of transient ischemic attack (TIA), and cerebral infarction without residual deficits: Secondary | ICD-10-CM | POA: Diagnosis not present

## 2016-12-31 DIAGNOSIS — T887XXA Unspecified adverse effect of drug or medicament, initial encounter: Secondary | ICD-10-CM | POA: Insufficient documentation

## 2016-12-31 DIAGNOSIS — F17210 Nicotine dependence, cigarettes, uncomplicated: Secondary | ICD-10-CM | POA: Insufficient documentation

## 2016-12-31 DIAGNOSIS — Z794 Long term (current) use of insulin: Secondary | ICD-10-CM | POA: Diagnosis not present

## 2016-12-31 DIAGNOSIS — T50905A Adverse effect of unspecified drugs, medicaments and biological substances, initial encounter: Secondary | ICD-10-CM

## 2016-12-31 DIAGNOSIS — E119 Type 2 diabetes mellitus without complications: Secondary | ICD-10-CM | POA: Insufficient documentation

## 2016-12-31 DIAGNOSIS — Z880 Allergy status to penicillin: Secondary | ICD-10-CM | POA: Insufficient documentation

## 2016-12-31 DIAGNOSIS — Z7902 Long term (current) use of antithrombotics/antiplatelets: Secondary | ICD-10-CM | POA: Insufficient documentation

## 2016-12-31 DIAGNOSIS — Y639 Failure in dosage during unspecified surgical and medical care: Secondary | ICD-10-CM | POA: Insufficient documentation

## 2016-12-31 DIAGNOSIS — E039 Hypothyroidism, unspecified: Secondary | ICD-10-CM | POA: Insufficient documentation

## 2016-12-31 DIAGNOSIS — Z885 Allergy status to narcotic agent status: Secondary | ICD-10-CM | POA: Diagnosis not present

## 2016-12-31 DIAGNOSIS — Z79899 Other long term (current) drug therapy: Secondary | ICD-10-CM | POA: Insufficient documentation

## 2016-12-31 DIAGNOSIS — I119 Hypertensive heart disease without heart failure: Secondary | ICD-10-CM | POA: Diagnosis not present

## 2016-12-31 DIAGNOSIS — I2510 Atherosclerotic heart disease of native coronary artery without angina pectoris: Secondary | ICD-10-CM | POA: Insufficient documentation

## 2016-12-31 LAB — I-STAT CHEM 8, ED
BUN: 3 mg/dL — ABNORMAL LOW (ref 6–20)
CALCIUM ION: 1.17 mmol/L (ref 1.15–1.40)
CREATININE: 0.3 mg/dL — AB (ref 0.44–1.00)
Chloride: 105 mmol/L (ref 101–111)
GLUCOSE: 116 mg/dL — AB (ref 65–99)
HCT: 34 % — ABNORMAL LOW (ref 36.0–46.0)
HEMOGLOBIN: 11.6 g/dL — AB (ref 12.0–15.0)
Potassium: 3 mmol/L — ABNORMAL LOW (ref 3.5–5.1)
Sodium: 143 mmol/L (ref 135–145)
TCO2: 27 mmol/L (ref 22–32)

## 2016-12-31 LAB — URINALYSIS, ROUTINE W REFLEX MICROSCOPIC
BILIRUBIN URINE: NEGATIVE
GLUCOSE, UA: NEGATIVE mg/dL
HGB URINE DIPSTICK: NEGATIVE
Ketones, ur: NEGATIVE mg/dL
Leukocytes, UA: NEGATIVE
Nitrite: NEGATIVE
PROTEIN: NEGATIVE mg/dL
Specific Gravity, Urine: 1.003 — ABNORMAL LOW (ref 1.005–1.030)
pH: 7 (ref 5.0–8.0)

## 2016-12-31 MED ORDER — SODIUM CHLORIDE 0.9 % IV BOLUS (SEPSIS)
1000.0000 mL | Freq: Once | INTRAVENOUS | Status: AC
  Administered 2016-12-31: 1000 mL via INTRAVENOUS

## 2016-12-31 MED ORDER — TRAMADOL HCL 50 MG PO TABS
50.0000 mg | ORAL_TABLET | Freq: Once | ORAL | Status: DC
  Filled 2016-12-31: qty 1

## 2016-12-31 MED ORDER — BENZTROPINE MESYLATE 1 MG/ML IJ SOLN
2.0000 mg | Freq: Once | INTRAMUSCULAR | Status: AC
  Administered 2016-12-31: 2 mg via INTRAMUSCULAR
  Filled 2016-12-31 (×2): qty 2

## 2016-12-31 MED ORDER — BENZTROPINE MESYLATE 1 MG PO TABS: 1.0000 mg | ORAL_TABLET | Freq: Two times a day (BID) | ORAL | 0 refills | Status: AC

## 2016-12-31 MED ORDER — DIPHENHYDRAMINE HCL 50 MG/ML IJ SOLN
25.0000 mg | Freq: Once | INTRAMUSCULAR | Status: AC
  Administered 2016-12-31: 25 mg via INTRAVENOUS
  Filled 2016-12-31: qty 1

## 2016-12-31 NOTE — Discharge Instructions (Signed)
Stop Gabapentin and Cymbalta. Cogentin as prescribed x 2 days.

## 2016-12-31 NOTE — ED Triage Notes (Signed)
Pt c/o dystonic reaction to gabapentin.  States she started taking medication yesterday after being switched from Cymbalta for same reaction.  Reports involuntary movement of lower facial muscles and jerking.

## 2017-01-01 NOTE — ED Provider Notes (Signed)
[facility] DEPT Provider Note   CSN: [id number] Arrival date & time: 12/31/16  1946     History   Chief Complaint Chief Complaint  Patient presents with  . Reaction to medication    HPI Laura Oconnor is a 61 y.o. female.Chief complaint is possible medication reaction  HPI:  This is a 38-year-old female with a history of chronic pain. Previous CVA in his left her with right facial and upper extremity weakness since February the shear. She was states that her arm hurts all the time. She has been on multiple medications for this most recent was on Cymbalta. She developed a facial twitch on Friday and states that either she, or her doctor changed her back to Neurontin. She is unclear about this as is her son who I spoke with on the phone. Nonetheless she developed right facial twitch and continues to take Neurontin and Cymbalta for the last 2-3 days.  Past Medical History:  Diagnosis Date  . Chronic arm pain   . Chronic pain syndrome   . Diabetes mellitus   . Heart attack ([place])   . History of right coronary artery stent placement    2 stents placed at separate times  . Hypertension   . Stroke ([place])   . Thyroid disease     Patient Active Problem List   Diagnosis Date Noted  . Hypokalemia 08/13/2014  . Diarrhea 08/13/2014  . Loss of weight 08/13/2014  . Hypocalcemia 08/13/2014  . Atrophic vaginitis 08/13/2014  . Type 2 diabetes mellitus, uncontrolled ([place]) 08/13/2014  . Breast cancer ([place]) 08/13/2014  . Hypothyroidism 08/13/2014  . CAD (coronary artery disease) 08/13/2014    Past Surgical History:  Procedure Laterality Date  . ABDOMINAL HYSTERECTOMY     patient denies  . BLADDER SUSPENSION    . LIPOMA EXCISION      OB History    No data available       Home Medications    Prior to Admission medications   Medication Sig Start Date End Date Taking? Authorizing Provider  atorvastatin (LIPITOR) 80 MG tablet Take 80 mg by mouth daily.   Yes [provider]  Benzocaine (BOIL-EASE EX) Apply 1 application topically daily as needed (pain from boils).   Yes [provider]  clopidogrel (PLAVIX) 75 MG tablet Take 75 mg by mouth daily.   Yes [provider]  Docusate Calcium (STOOL SOFTENER PO) Take 1 capsule by mouth daily.   Yes [provider]  ferrous sulfate 220 (44 Fe) MG/5ML solution Take 220 mg by mouth daily.   Yes [provider]  ibuprofen (ADVIL,MOTRIN) 200 MG tablet Take 100 mg by mouth every 6 (six) hours as needed for headache (pain).   Yes [provider]  insulin glargine (LANTUS) 100 UNIT/ML injection Inject 0.75 mLs (75 Units total) into the skin at bedtime. Patient taking differently: Inject 20 Units into the skin 2 (two) times daily.  08/14/14  Yes Thurnell Lose, MD  lactulose (CHRONULAC) 10 GM/15ML solution Take 10 g by mouth 2 (two) times daily as needed for mild constipation.   Yes [provider]  levothyroxine (SYNTHROID, LEVOTHROID) 137 MCG tablet Take 137 mcg by mouth daily before breakfast.   Yes [provider]  LORazepam (ATIVAN) 0.5 MG tablet Take 0.5 mg by mouth See admin instructions. Take 1 tablet (0.5 mg) by mouth daily at bedtime, may also take 1/2 tablet (0.25 mg) once during the day as needed for panic attacks 12/07/16  Yes [provider]  ondansetron (ZOFRAN-ODT) 4 MG disintegrating tablet Take 4 mg by mouth every 6 (six) hours as needed. 12/22/16  Yes [provider]  senna-docusate (DOK PLUS) 8.6-50 MG tablet Take 1 tablet by mouth daily as needed for mild constipation.   Yes [provider]  benztropine (COGENTIN) 1 MG tablet Take 1 tablet (1 mg total) by mouth 2 (two) times daily. 12/31/16   Tanna Furry, MD  ferrous sulfate 325 (65 FE) MG tablet Take 1 tablet (325 mg total) by mouth 2 (two) times daily with a meal. Patient not taking: Reported on 12/31/2016 08/14/14   Thurnell Lose, MD  polyethylene glycol [facility]) packet  Take 17 g by mouth daily. Patient not taking: Reported on 12/31/2016 09/25/16   Long, Wonda Olds, MD  Spacer/Aero-Holding Chambers (AEROCHAMBER PLUS WITH MASK) inhaler Use as instructed Patient not taking: Reported on 12/31/2016 04/22/12   Riki Altes, MD  traMADol (ULTRAM) 50 MG tablet Take 1 tablet (50 mg total) by mouth every 6 (six) hours as needed. Patient not taking: Reported on 12/31/2016 11/19/16   Veryl Speak, MD    Family History Family History  Problem Relation Age of Onset  . Diabetes Father   . Diabetes Sister   . Diabetes Brother     Social History Social History  Substance Use Topics  . Smoking status: Current Every Day Smoker    Packs/day: 0.50    Types: Cigarettes  . Smokeless tobacco: Never Used  . Alcohol use No     Allergies   Amoxicillin; Morphine and related; Percocet [oxycodone-acetaminophen]; Percodan [oxycodone-aspirin]; Cymbalta [duloxetine hcl]; Gabapentin; Penicillins; Torecan [thiethylperazine]; and Sulfa antibiotics   Review of Systems Review of Systems  Constitutional: Negative for appetite change, chills, diaphoresis, fatigue and fever.  HENT: Negative for mouth sores, sore throat and trouble swallowing.        Right facial involuntary movements and lip smacking.  Eyes: Negative for visual disturbance.  Respiratory: Negative for cough, chest tightness, shortness of breath and wheezing.   Cardiovascular: Negative for chest pain.  Gastrointestinal: Negative for abdominal distention, abdominal pain, diarrhea, nausea and vomiting.  Endocrine: Negative for polydipsia, polyphagia and polyuria.  Genitourinary: Negative for dysuria, frequency and hematuria.  Musculoskeletal: Negative for gait problem.  Skin: Negative for color change, pallor and rash.  Neurological: Negative for dizziness, syncope, light-headedness and headaches.       Right lower facial droop and right upper trauma contracture consistent with prior stroke.  Hematological: Does not  bruise/bleed easily.  Psychiatric/Behavioral: Negative for behavioral problems and confusion.     Physical Exam Updated Vital Signs BP (!) 165/76   Pulse (!) 113   Temp 98.2 F (36.8 C) (Oral)   Resp 17   SpO2 100%   Physical Exam  Constitutional: She is oriented to person, place, and time. She appears well-developed and well-nourished. No distress.  HENT:  Head: Normocephalic.  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  Eyes: Pupils are equal, round, and reactive to light. Conjunctivae are normal. No scleral icterus.  Neck: Normal range of motion. Neck supple. No thyromegaly present.  Cardiovascular: Normal rate and regular rhythm.  Exam reveals no gallop and no friction rub.   No murmur heard. Pulmonary/Chest: Effort normal and breath sounds normal. No respiratory distress. She has no wheezes. She has no rales.  Abdominal: Soft. Bowel sounds are normal. She exhibits no distension. There is no tenderness. There is no rebound.  Musculoskeletal: Normal range of motion.  Neurological: She is alert and oriented to person, place, and time.  Right lower face, and right upper extremity weakness. Right upper trauma contracture.  Skin: Skin is warm and dry. No rash noted.  Psychiatric: She has a normal mood and affect. Her behavior is normal.     ED Treatments / Results  Labs (all labs ordered are listed, but only abnormal results are displayed) Labs Reviewed  URINALYSIS, ROUTINE W REFLEX MICROSCOPIC - Abnormal; Notable for the following:       Result Value   Color, Urine STRAW (*)    Specific Gravity, Urine 1.003 (*)    All other components within normal limits  I-STAT CHEM 8, ED - Abnormal; Notable for the following:    Potassium 3.0 (*)    BUN 3 (*)    Creatinine, Ser 0.30 (*)    Glucose, Bld 116 (*)    Hemoglobin 11.6 (*)    HCT 34.0 (*)    All other components within normal limits    EKG  EKG  Interpretation None       Radiology No results found.  Procedures Procedures (including critical care time)  Medications Ordered in ED Medications  traMADol (ULTRAM) tablet 50 mg (not administered)  sodium chloride 0.9 % bolus 1,000 mL (1,000 mLs Intravenous New Bag/Given 12/31/16 2202)  benztropine mesylate (COGENTIN) injection 2 mg (2 mg Intramuscular Given 12/31/16 2202)  diphenhydrAMINE (BENADRYL) injection 25 mg (25 mg Intravenous Given 12/31/16 2202)     Initial Impression / Assessment and Plan / ED Course  I have reviewed the triage vital signs and the nursing notes.  Pertinent labs & imaging results that were available during my care of the patient were reviewed by me and considered in my medical decision making (see chart for details).   is given Benadryl, and Cogentin. Her symptoms do improve. Cymbalta and Neurontin are not notorious for dystonia's. She is not taking any additional phenothiazines or other neuroleptics perivascular to discontinue use of her Cymbalta and gabapentin. We'll continue lunch Cogentin twice a day and contact her physician tomorrow for further structures were about her ongoing and chronic pain.  Final Clinical Impressions(s) / ED Diagnoses   Final diagnoses:  Adverse effect of drug, initial encounter    New Prescriptions New Prescriptions   BENZTROPINE (COGENTIN) 1 MG TABLET    Take 1 tablet (1 mg total) by mouth 2 (two) times daily.     Tanna Furry, MD 01/01/17 [phone]

## 2017-01-10 ENCOUNTER — Ambulatory Visit (INDEPENDENT_AMBULATORY_CARE_PROVIDER_SITE_OTHER): Payer: Medicaid Other | Admitting: Family Medicine

## 2017-01-10 ENCOUNTER — Encounter: Payer: Self-pay | Admitting: Family Medicine

## 2017-01-10 DIAGNOSIS — M25511 Pain in right shoulder: Secondary | ICD-10-CM

## 2017-01-10 DIAGNOSIS — G8929 Other chronic pain: Secondary | ICD-10-CM | POA: Diagnosis not present

## 2017-01-10 MED ORDER — METHYLPREDNISOLONE ACETATE 40 MG/ML IJ SUSP
40.0000 mg | Freq: Once | INTRAMUSCULAR | Status: AC
  Administered 2017-01-10: 40 mg via INTRA_ARTICULAR

## 2017-01-10 NOTE — Patient Instructions (Signed)
You have a frozen shoulder (adhesive capsulitis), a buildup of scar tissue that limits motion of the shoulder joint, along with the spasticity of muscles surrounding the shoulder blade. Heat 15 minutes at a time 3-4 times a day may help with movement and stiffness. Steroid injections in a series have been shown to help with pain and motion - you were given this today. I think the botox injections will help you also. Codman exercises (pendulum, wall walking or table slides, arm circles) - do 3 sets of 10 once or twice a day with assistance. Follow up in 1 month

## 2017-01-13 DIAGNOSIS — M25511 Pain in right shoulder: Secondary | ICD-10-CM | POA: Insufficient documentation

## 2017-01-13 NOTE — Progress Notes (Signed)
PCP: Janyth Pupa, NP  Subjective:   HPI: Patient is a 60 y.o. female here for right shoulder pain.  Patient here with son and health care worker who helped provide history. They report back in February this year she suffered a stroke that affected her right side. Since then she has struggled with pain on her right side especially in right arm and shoulder. Has tried heat, ibuprofen. She is finished with rehab following her CVA. She is seeing PM&R physician and waiting on approval for botox for spasticity of right upper extremity. She's had a couple sets of radiographs and told she may have fluid in shoulder. No prior right shoulder injuries she's aware of. Pain lateral right shoulder but also severe and sharp into right scapula, neck. Very limited motion of right shoulder. Pain level now 7/10. No skin changes. Lack of sensation in right arm.  Past Medical History:  Diagnosis Date  . Chronic arm pain   . Chronic pain syndrome   . Diabetes mellitus   . Heart attack ([place])   . History of right coronary artery stent placement    2 stents placed at separate times  . Hypertension   . Stroke ([place])   . Thyroid disease     Current Outpatient Prescriptions on File Prior to Visit  Medication Sig Dispense Refill  . atorvastatin (LIPITOR) 80 MG tablet Take 80 mg by mouth daily.    . Benzocaine (BOIL-EASE EX) Apply 1 application topically daily as needed (pain from boils).    . benztropine (COGENTIN) 1 MG tablet Take 1 tablet (1 mg total) by mouth 2 (two) times daily. 4 tablet 0  . clopidogrel (PLAVIX) 75 MG tablet Take 75 mg by mouth daily.    Mariane Baumgarten Calcium (STOOL SOFTENER PO) Take 1 capsule by mouth daily.    . ferrous sulfate 220 (44 Fe) MG/5ML solution Take 220 mg by mouth daily.    . ferrous sulfate 325 (65 FE) MG tablet Take 1 tablet (325 mg total) by mouth 2 (two) times daily with a meal. (Patient not taking: Reported on 12/31/2016) 60 tablet 1  . ibuprofen  (ADVIL,MOTRIN) 200 MG tablet Take 100 mg by mouth every 6 (six) hours as needed for headache (pain).    . insulin glargine (LANTUS) 100 UNIT/ML injection Inject 0.75 mLs (75 Units total) into the skin at bedtime. (Patient taking differently: Inject 20 Units into the skin 2 (two) times daily. ) 10 mL 11  . lactulose (CHRONULAC) 10 GM/15ML solution Take 10 g by mouth 2 (two) times daily as needed for mild constipation.    Marland Kitchen levothyroxine (SYNTHROID, LEVOTHROID) 137 MCG tablet Take 137 mcg by mouth daily before breakfast.    . LORazepam (ATIVAN) 0.5 MG tablet Take 0.5 mg by mouth See admin instructions. Take 1 tablet (0.5 mg) by mouth daily at bedtime, may also take 1/2 tablet (0.25 mg) once during the day as needed for panic attacks  1  . ondansetron (ZOFRAN-ODT) 4 MG disintegrating tablet Take 4 mg by mouth every 6 (six) hours as needed.  2  . polyethylene glycol (MIRALAX) packet Take 17 g by mouth daily. (Patient not taking: Reported on 12/31/2016) 14 each 0  . senna-docusate (DOK PLUS) 8.6-50 MG tablet Take 1 tablet by mouth daily as needed for mild constipation.    Marland Kitchen Spacer/Aero-Holding Chambers (AEROCHAMBER PLUS WITH MASK) inhaler Use as instructed (Patient not taking: Reported on 12/31/2016) 1 each 2  . traMADol (ULTRAM) 50 MG tablet Take 1  tablet (50 mg total) by mouth every 6 (six) hours as needed. (Patient not taking: Reported on 12/31/2016) 15 tablet 0   No current facility-administered medications on file prior to visit.     Past Surgical History:  Procedure Laterality Date  . ABDOMINAL HYSTERECTOMY     patient denies  . BLADDER SUSPENSION    . LIPOMA EXCISION      Allergies  Allergen Reactions  . Amoxicillin Nausea And Vomiting  . Morphine And Related Nausea And Vomiting and Other (See Comments)    Also passed out  . Percocet [Oxycodone-Acetaminophen] Nausea And Vomiting  . Percodan [Oxycodone-Aspirin] Nausea And Vomiting  . Cymbalta [Duloxetine Hcl] Other (See Comments)     Tremors/uncontrolled movement per son  . Gabapentin Other (See Comments)    Tremors/uncontrolled movements per son  . Penicillins Nausea And Vomiting and Swelling    Tongue swelling Has patient had a PCN reaction causing immediate rash, facial/tongue/throat swelling, SOB or lightheadedness with hypotension: Yes Has patient had a PCN reaction causing severe rash involving mucus membranes or skin necrosis: Unknown Has patient had a PCN reaction that required hospitalization: Unknown Has patient had a PCN reaction occurring within the last 10 years: Unknown If all of the above answers are "NO", then may proceed with Cephalosporin use.  . Torecan [Thiethylperazine] Other (See Comments)    Pt does not recall this medication  . Sulfa Antibiotics Nausea And Vomiting and Rash    Social History   Social History  . Marital status: Single    Spouse name: N/A  . Number of children: N/A  . Years of education: N/A   Occupational History  . Not on file.   Social History Main Topics  . Smoking status: Current Every Day Smoker    Packs/day: 0.50    Types: Cigarettes  . Smokeless tobacco: Never Used  . Alcohol use No  . Drug use: No  . Sexual activity: Not on file   Other Topics Concern  . Not on file   Social History Narrative  . No narrative on file    Family History  Problem Relation Age of Onset  . Diabetes Father   . Diabetes Sister   . Diabetes Brother     BP 134/69   Pulse (!) 107   Ht 4\' 11"  (1.499 m)   Wt 107 lb (48.5 kg)   BMI 21.61 kg/m   Review of Systems: See HPI above.     Objective:  Physical Exam:  Gen: NAD, comfortable in exam room  Right shoulder: No swelling, ecchymoses.  Muscle atrophy but nonfocal. No other gross deformity. TTP diffusely about shoulder and right scapula. No active ROM of right shoulder.  Passively limited to 10 degrees ER.  Full IR.  Abduction and flexion only to 45 degrees passively. No strength with attempted resisted  IR/ER.  Left shoulder: FROM without pain.   Assessment & Plan:  1. Right shoulder pain - due to combination of spasticity, adhesive capsulitis, chronic pain s/p CVA.  Discussed options - went ahead with intraarticular cortisone injection today.  Heat, shown motion exercises she can do with active assist for this.  F/u in 1 month.  After informed written consent timeout was performed, patient was seated on exam table. Right shoulder was prepped with alcohol swab and utilizing posterior approach, patient's right glenohumeral space was injected with 3:1 bupivicaine: depomedrol. Patient tolerated the procedure well without immediate complications.

## 2017-01-13 NOTE — Assessment & Plan Note (Signed)
due to combination of spasticity, adhesive capsulitis, chronic pain s/p CVA.  Discussed options - went ahead with intraarticular cortisone injection today.  Heat, shown motion exercises she can do with active assist for this.  F/u in 1 month.  After informed written consent timeout was performed, patient was seated on exam table. Right shoulder was prepped with alcohol swab and utilizing posterior approach, patient's right glenohumeral space was injected with 3:1 bupivicaine: depomedrol. Patient tolerated the procedure well without immediate complications.

## 2017-01-29 ENCOUNTER — Emergency Department (HOSPITAL_BASED_OUTPATIENT_CLINIC_OR_DEPARTMENT_OTHER)
Admission: EM | Admit: 2017-01-29 | Discharge: 2017-01-29 | Disposition: A | Discharge status: Home / Self Care | Payer: Medicaid Other | Attending: Emergency Medicine | Admitting: Emergency Medicine

## 2017-01-29 ENCOUNTER — Encounter (HOSPITAL_BASED_OUTPATIENT_CLINIC_OR_DEPARTMENT_OTHER): Payer: Self-pay | Admitting: *Deleted

## 2017-01-29 ENCOUNTER — Emergency Department (HOSPITAL_BASED_OUTPATIENT_CLINIC_OR_DEPARTMENT_OTHER): Payer: Medicaid Other

## 2017-01-29 DIAGNOSIS — Z853 Personal history of malignant neoplasm of breast: Secondary | ICD-10-CM | POA: Diagnosis not present

## 2017-01-29 DIAGNOSIS — I252 Old myocardial infarction: Secondary | ICD-10-CM | POA: Diagnosis not present

## 2017-01-29 DIAGNOSIS — R000 Tachycardia, unspecified: Secondary | ICD-10-CM | POA: Diagnosis not present

## 2017-01-29 DIAGNOSIS — R509 Fever, unspecified: Secondary | ICD-10-CM | POA: Diagnosis not present

## 2017-01-29 DIAGNOSIS — R531 Weakness: Secondary | ICD-10-CM | POA: Diagnosis present

## 2017-01-29 DIAGNOSIS — I2510 Atherosclerotic heart disease of native coronary artery without angina pectoris: Secondary | ICD-10-CM | POA: Insufficient documentation

## 2017-01-29 DIAGNOSIS — F17210 Nicotine dependence, cigarettes, uncomplicated: Secondary | ICD-10-CM | POA: Diagnosis not present

## 2017-01-29 DIAGNOSIS — G8929 Other chronic pain: Secondary | ICD-10-CM | POA: Insufficient documentation

## 2017-01-29 DIAGNOSIS — E119 Type 2 diabetes mellitus without complications: Secondary | ICD-10-CM | POA: Diagnosis not present

## 2017-01-29 DIAGNOSIS — Z955 Presence of coronary angioplasty implant and graft: Secondary | ICD-10-CM | POA: Diagnosis not present

## 2017-01-29 DIAGNOSIS — G8101 Flaccid hemiplegia affecting right dominant side: Secondary | ICD-10-CM | POA: Diagnosis not present

## 2017-01-29 DIAGNOSIS — I10 Essential (primary) hypertension: Secondary | ICD-10-CM | POA: Diagnosis not present

## 2017-01-29 DIAGNOSIS — R778 Other specified abnormalities of plasma proteins: Secondary | ICD-10-CM

## 2017-01-29 DIAGNOSIS — R748 Abnormal levels of other serum enzymes: Secondary | ICD-10-CM | POA: Diagnosis not present

## 2017-01-29 DIAGNOSIS — M79601 Pain in right arm: Secondary | ICD-10-CM | POA: Diagnosis not present

## 2017-01-29 DIAGNOSIS — R7989 Other specified abnormal findings of blood chemistry: Secondary | ICD-10-CM

## 2017-01-29 LAB — COMPREHENSIVE METABOLIC PANEL
ALT: 15 U/L (ref 14–54)
AST: 20 U/L (ref 15–41)
Albumin: 3.4 g/dL — ABNORMAL LOW (ref 3.5–5.0)
Alkaline Phosphatase: 125 U/L (ref 38–126)
Anion gap: 7 (ref 5–15)
BUN: 6 mg/dL (ref 6–20)
CHLORIDE: 103 mmol/L (ref 101–111)
CO2: 26 mmol/L (ref 22–32)
CREATININE: 0.39 mg/dL — AB (ref 0.44–1.00)
Calcium: 9.2 mg/dL (ref 8.9–10.3)
GFR calc non Af Amer: >60 mL/min (ref >60)
Glucose, Bld: 159 mg/dL — ABNORMAL HIGH (ref 65–99)
POTASSIUM: 3.4 mmol/L — AB (ref 3.5–5.1)
SODIUM: 136 mmol/L (ref 135–145)
Total Bilirubin: 0.5 mg/dL (ref 0.3–1.2)
Total Protein: 7.4 g/dL (ref 6.5–8.1)

## 2017-01-29 LAB — I-STAT CG4 LACTIC ACID, ED: Lactic Acid, Venous: 1.01 mmol/L (ref 0.5–1.9)

## 2017-01-29 LAB — CBC WITH DIFFERENTIAL/PLATELET
Basophils Absolute: 0 10*3/uL (ref 0.0–0.1)
Basophils Relative: 0 %
Eosinophils Absolute: 0.2 10*3/uL (ref 0.0–0.7)
Eosinophils Relative: 4 %
HEMATOCRIT: 30.1 % — AB (ref 36.0–46.0)
HEMOGLOBIN: 9.6 g/dL — AB (ref 12.0–15.0)
LYMPHS ABS: 0.5 10*3/uL — AB (ref 0.7–4.0)
LYMPHS PCT: 8 %
MCH: 23.6 pg — ABNORMAL LOW (ref 26.0–34.0)
MCHC: 31.9 g/dL (ref 30.0–36.0)
MCV: 74.1 fL — ABNORMAL LOW (ref 78.0–100.0)
MONOS PCT: 10 %
Monocytes Absolute: 0.6 10*3/uL (ref 0.1–1.0)
NEUTROS ABS: 4.6 10*3/uL (ref 1.7–7.7)
NEUTROS PCT: 78 %
PLATELETS: 360 10*3/uL (ref 150–400)
RBC: 4.06 MIL/uL (ref 3.87–5.11)
RDW: 16.5 % — ABNORMAL HIGH (ref 11.5–15.5)
WBC: 5.9 10*3/uL (ref 4.0–10.5)

## 2017-01-29 LAB — URINALYSIS, ROUTINE W REFLEX MICROSCOPIC
Bilirubin Urine: NEGATIVE
Glucose, UA: NEGATIVE mg/dL
HGB URINE DIPSTICK: NEGATIVE
Ketones, ur: NEGATIVE mg/dL
LEUKOCYTES UA: NEGATIVE
Nitrite: NEGATIVE
Protein, ur: NEGATIVE mg/dL
pH: 6 (ref 5.0–8.0)

## 2017-01-29 LAB — TROPONIN I
TROPONIN I: 0.45 ng/mL — AB (ref <0.03)
TROPONIN I: 0.38 ng/mL — AB (ref <0.03)

## 2017-01-29 MED ORDER — IBUPROFEN 200 MG PO TABS
200.0000 mg | ORAL_TABLET | Freq: Once | ORAL | Status: AC
  Administered 2017-01-29: 200 mg via ORAL
  Filled 2017-01-29: qty 1

## 2017-01-29 MED ORDER — FENTANYL CITRATE (PF) 100 MCG/2ML IJ SOLN
25.0000 ug | Freq: Once | INTRAMUSCULAR | Status: AC
  Administered 2017-01-29: 25 ug via INTRAVENOUS
  Filled 2017-01-29: qty 2

## 2017-01-29 MED ORDER — SODIUM CHLORIDE 0.9 % IV BOLUS (SEPSIS)
1000.0000 mL | Freq: Once | INTRAVENOUS | Status: AC
  Administered 2017-01-29: 1000 mL via INTRAVENOUS

## 2017-01-29 MED ORDER — IOPAMIDOL (ISOVUE-370) INJECTION 76%
100.0000 mL | Freq: Once | INTRAVENOUS | Status: AC | PRN
  Administered 2017-01-29: 100 mL via INTRAVENOUS

## 2017-01-29 MED ORDER — LORAZEPAM 1 MG PO TABS
0.5000 mg | ORAL_TABLET | Freq: Once | ORAL | Status: AC
  Administered 2017-01-29: 0.5 mg via ORAL
  Filled 2017-01-29: qty 1

## 2017-01-29 MED ORDER — FENTANYL CITRATE (PF) 100 MCG/2ML IJ SOLN
25.0000 ug | Freq: Once | INTRAMUSCULAR | Status: DC
Start: 2017-01-29 — End: 2017-01-30
  Filled 2017-01-29: qty 2

## 2017-01-29 NOTE — ED Provider Notes (Signed)
[facility] Provider Note   CSN: [id number] Arrival date & time: 01/29/17  1433     History   Chief Complaint Chief Complaint  Patient presents with  . Weakness    HPI Laura Oconnor is a 61 y.o. female.  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 Son who is her caregiver states on Saturday she wasn't feeling quite herself and seemed to be more tired but seemed to be okay yesterday until last night. Last night patient started running fever and had generalized sweats. She's been extremely fatigued and falls asleep mid sentence. She refuses to eat or drink this morning. She stated one time she felt short of breath but denies any chest pain or shortness of breath currently. She refused to take all of her medications this morning. Son states she complained of some rectal pain but he has not seen any wounds. She has a small infected hair follicle in her right groin but it is not red or has much swelling. She denies any nausea, vomiting or diarrhea. Her last bowel movement was on Friday and son states he only time she has bowel movements as when he gives enema which is twice a week.  Patient is complaining of pain in her right arm shoulder and neck but her sense states that's chronic related to the spasticity from stroke.   The history is provided by the patient and a caregiver.  Weakness  Meds prior to arrival: only meds today was her insulin.    Past Medical History:  Diagnosis Date  . Chronic arm pain   . Chronic pain syndrome   . Diabetes mellitus   . Heart attack ([place])   . History of right coronary artery stent placement    2 stents placed at separate times  . Hypertension   . Stroke ([place])   .  Thyroid disease     Patient Active Problem List   Diagnosis Date Noted  . Right shoulder pain 01/13/2017  . History of CVA with residual deficit 10/16/2016  . Hemiplegia affecting right dominant side ([place]) 05/31/2016  . Hypokalemia 08/13/2014  . Diarrhea 08/13/2014  . Loss of weight 08/13/2014  . Hypocalcemia 08/13/2014  . Atrophic vaginitis 08/13/2014  . Type 2 diabetes mellitus, uncontrolled ([place]) 08/13/2014  . Breast cancer ([place]) 08/13/2014  . Hypothyroidism 08/13/2014  . CAD (coronary artery disease) 08/13/2014    Past Surgical History:  Procedure Laterality Date  . ABDOMINAL HYSTERECTOMY     patient denies  . BLADDER SUSPENSION    . LIPOMA EXCISION      OB History    No data available       Home Medications    Prior to Admission medications   Medication Sig Start Date End Date Taking? Authorizing Provider  atorvastatin (LIPITOR) 80 MG tablet Take 80 mg by mouth daily.    [provider]  Benzocaine (BOIL-EASE EX) Apply 1 application topically daily as needed (pain from boils).    [provider]  benztropine (COGENTIN) 1 MG tablet Take 1 tablet (1 mg total) by mouth 2 (two) times daily. 12/31/16   Tanna Furry, MD  clopidogrel (PLAVIX) 75 MG tablet Take 75 mg by mouth daily.    [provider]  Docusate Calcium (STOOL SOFTENER PO) Take 1  capsule by mouth daily.    [provider]  ferrous sulfate 220 (44 Fe) MG/5ML solution Take 220 mg by mouth daily.    [provider]  ferrous sulfate 325 (65 FE) MG tablet Take 1 tablet (325 mg total) by mouth 2 (two) times daily with a meal. Patient not taking: Reported on 12/31/2016 08/14/14   Thurnell Lose, MD  ibuprofen (ADVIL,MOTRIN) 200 MG tablet Take 100 mg by mouth every 6 (six) hours as needed for headache (pain).    [provider]  insulin glargine (LANTUS) 100 UNIT/ML injection Inject 0.75 mLs (75 Units total) into the skin at bedtime. Patient taking differently:  Inject 20 Units into the skin 2 (two) times daily.  08/14/14   Thurnell Lose, MD  lactulose (CHRONULAC) 10 GM/15ML solution Take 10 g by mouth 2 (two) times daily as needed for mild constipation.    [provider]  levothyroxine (SYNTHROID, LEVOTHROID) 137 MCG tablet Take 137 mcg by mouth daily before breakfast.    [provider]  LORazepam (ATIVAN) 0.5 MG tablet Take 0.5 mg by mouth See admin instructions. Take 1 tablet (0.5 mg) by mouth daily at bedtime, may also take 1/2 tablet (0.25 mg) once during the day as needed for panic attacks 12/07/16   [provider]  ondansetron (ZOFRAN-ODT) 4 MG disintegrating tablet Take 4 mg by mouth every 6 (six) hours as needed. 12/22/16   [provider]  polyethylene glycol (MIRALAX) packet Take 17 g by mouth daily. Patient not taking: Reported on 12/31/2016 09/25/16   Long, Wonda Olds, MD  senna-docusate (DOK PLUS) 8.6-50 MG tablet Take 1 tablet by mouth daily as needed for mild constipation.    [provider]  Spacer/Aero-Holding Chambers (AEROCHAMBER PLUS WITH MASK) inhaler Use as instructed Patient not taking: Reported on 12/31/2016 04/22/12   Riki Altes, MD  traMADol (ULTRAM) 50 MG tablet Take 1 tablet (50 mg total) by mouth every 6 (six) hours as needed. Patient not taking: Reported on 12/31/2016 11/19/16   Veryl Speak, MD    Family History Family History  Problem Relation Age of Onset  . Diabetes Father   . Diabetes Sister   . Diabetes Brother     Social History Social History  Substance Use Topics  . Smoking status: Current Every Day Smoker    Packs/day: 0.50    Types: Cigarettes  . Smokeless tobacco: Never Used  . Alcohol use No     Allergies   Amoxicillin; Morphine and related; Percocet [oxycodone-acetaminophen]; Percodan [oxycodone-aspirin]; Cymbalta [duloxetine hcl]; Gabapentin; Penicillins; Torecan [thiethylperazine]; and Sulfa antibiotics   Review of Systems Review of Systems    Neurological: Positive for weakness.  All other systems reviewed and are negative.    Physical Exam Updated Vital Signs BP (!) 142/59 (BP Location: Left Arm)   Pulse (!) 125   Temp 99.7 F (37.6 C) (Rectal)   Resp 18   Wt 48.5 kg (107 lb)   SpO2 99%   BMI 21.61 kg/m   Physical Exam  Constitutional: She is oriented to person, place, and time. She appears well-developed and well-nourished. No distress.  Pt awake and able to answer questions and then will fall asleep during sentences  HENT:  Head: Normocephalic and atraumatic.  Mouth/Throat: Oropharynx is clear and moist. Mucous membranes are dry.  Eyes: Pupils are equal, round, and reactive to light. Conjunctivae and EOM are normal.  Neck: Normal range of motion. Neck supple.  Cardiovascular: Normal rate, regular rhythm and  intact distal pulses.   No murmur heard. Pulmonary/Chest: Effort normal and breath sounds normal. No respiratory distress. She has no wheezes. She has no rales.  Abdominal: Soft. She exhibits no distension. There is no tenderness. There is no rebound and no guarding.  Genitourinary:     Genitourinary Comments: Normal rectum and no stool in the rectal vault. No evidence of decubitus wounds  Musculoskeletal: Normal range of motion. She exhibits no edema or tenderness.  Neurological: She is alert and oriented to person, place, and time.  5/5 strength in left upper/lower ext.  Spasticity of the right upper and lower ext without voluntary movement.  Right sided facial droop and slightly slurred speech  Skin: Skin is warm and dry. No rash noted. No erythema.  Psychiatric: She has a normal mood and affect. Her behavior is normal.  Nursing note and vitals reviewed.    ED Treatments / Results  Labs (all labs ordered are listed, but only abnormal results are displayed) Labs Reviewed  CBC WITH DIFFERENTIAL/PLATELET - Abnormal; Notable for the following:       Result Value   Hemoglobin 9.6 (*)    HCT 30.1 (*)     MCV 74.1 (*)    MCH 23.6 (*)    RDW 16.5 (*)    Lymphs Abs 0.5 (*)    All other components within normal limits  COMPREHENSIVE METABOLIC PANEL - Abnormal; Notable for the following:    Potassium 3.4 (*)    Glucose, Bld 159 (*)    Creatinine, Ser 0.39 (*)    Albumin 3.4 (*)    All other components within normal limits  URINALYSIS, ROUTINE W REFLEX MICROSCOPIC - Abnormal; Notable for the following:    Specific Gravity, Urine <1.005 (*)    All other components within normal limits  TROPONIN I - Abnormal; Notable for the following:    Troponin I 0.45 (*)    All other components within normal limits  TROPONIN I - Abnormal; Notable for the following:    Troponin I 0.38 (*)    All other components within normal limits  I-STAT CG4 LACTIC ACID, ED    EKG  EKG Interpretation  Date/Time:  Monday January 29 2017 16:36:50 EDT Ventricular Rate:  115 PR Interval:    QRS Duration: 90 QT Interval:  341 QTC Calculation: 472 R Axis:   53 Text Interpretation:  Sinus tachycardia Probable left atrial enlargement Nonspecific T abnormalities, lateral leads No significant change since last tracing Confirmed by Blanchie Dessert [phone]) on 01/29/2017 5:08:14 PM       Radiology Dg Chest 2 View  Result Date: 01/29/2017 CLINICAL DATA:  Weakness EXAM: CHEST  2 VIEW COMPARISON:  11/11/2016 FINDINGS: Cardiac size normal. LAD stent. Negative for heart failure. Lungs are clear without infiltrate effusion or mass Right shoulder subluxation appears chronic and unchanged. IMPRESSION: No active cardiopulmonary disease. Electronically Signed   By: Franchot Gallo M.D.   On: 01/29/2017 17:02   Ct Angio Chest Pe W And/or Wo Contrast  Result Date: 01/29/2017 CLINICAL DATA:  61 y/o F; weakness and syncopal episodes. PE suspected. EXAM: CT ANGIOGRAPHY CHEST WITH CONTRAST TECHNIQUE: Multidetector CT imaging of the chest was performed using the standard protocol during bolus administration of intravenous  contrast. Multiplanar CT image reconstructions and MIPs were obtained to evaluate the vascular anatomy. CONTRAST:  100 cc Isovue 370 COMPARISON:  08/24/2016 CTA chest. FINDINGS: Cardiovascular: Satisfactory opacification of the pulmonary arteries to the segmental level. No evidence of pulmonary embolism. Normal heart  size. No pericardial effusion. Coronary stents and calcifications. Mild calcific atherosclerosis of thoracic aorta. Mediastinum/Nodes: Stable calcified 14 mm nodule in the left lobe of thyroid. No lymphadenopathy. Normal thoracic esophagus. Lungs/Pleura: Lungs are clear. No pleural effusion or pneumothorax. Upper Abdomen: No acute abnormality. Musculoskeletal: No chest wall abnormality. No acute or significant osseous findings. Review of the MIP images confirms the above findings. IMPRESSION: 1. No pulmonary embolus identified. 2. Coronary artery stents and calcifications. 3. Otherwise unremarkable CTA of the chest. Electronically Signed   By: Kristine Garbe M.D.   On: 01/29/2017 21:08    Procedures Procedures (including critical care time)  Medications Ordered in ED Medications  sodium chloride 0.9 % bolus 1,000 mL (not administered)     Initial Impression / Assessment and Plan / ED Course  I have reviewed the triage vital signs and the nursing notes.  Pertinent labs & imaging results that were available during my care of the patient were reviewed by me and considered in my medical decision making (see chart for details).    Patient presenting with times of weakness, fever and not acting herself. Temperature 99.7 rectally and heart rate of 125 bpm. Initially hypotensive at 90/60 but repeat blood pressure prior to intervention 142/59. She is not taken any of her medications today.  Patient has no abdominal pain chest or respiratory complaints at this time. Concern for potential infection. UA, chest x-ray pending. Labs show a CBC with a hemoglobin of 9.6 which she has had  before and UA wnl, CMP,  Troponin pending. CMP without acute findings, troponin elevated today at 0.45. Patient remains persistently tachycardic despite a negative chest x-ray. CT PE study to rule out clot as patient has higher risk because she is chronically immobile. CT PE study is negative for PE or other acute findings. Patient's tachycardias mildly improved with fluids. Repeat troponin is again elevated. Encouraged the patient to be admitted for further cardiac testing however she is insistent upon going home. She understands the risk that this could be her heart but states she is getting Botox treatments for her spasticity tomorrow and does not want to miss that. Shortly encourage patient her son to return if her symptoms worsen or she develops chest pain or shortness of breath.  Final Clinical Impressions(s) / ED Diagnoses   Final diagnoses:  Tachycardia  Elevated troponin    New Prescriptions Discharge Medication List as of 01/29/2017  9:16 PM       Blanchie Dessert, MD 01/29/17 2323

## 2017-01-29 NOTE — ED Notes (Signed)
ED Provider at bedside. 

## 2017-01-29 NOTE — ED Notes (Signed)
Patient wants to go home.  MD informed of pt's decision.  MD stated to ask patient and son if they can wait for 20 minutes for the CT scan result to come in.   Patient started taking off cardiac monitor with son at bedside.

## 2017-01-29 NOTE — ED Notes (Signed)
Date and time results received: 01/29/17 1746 (use smartphrase ".now" to insert current time)  Test: Troponin Critical Value:0.45  Name of Provider Notified: Dr. Theora Gianotti  Orders Received? Or Actions Taken?:

## 2017-01-29 NOTE — ED Notes (Signed)
Urine was collect by triage earlier

## 2017-01-29 NOTE — ED Notes (Signed)
Pt wants to see MD before going to CT. Dr. Maryan Rued in to room now.

## 2017-01-29 NOTE — ED Notes (Signed)
Patient refused Fentanyl 25 mcg because the dose is not high enough per Bertha, Therapist, sports.

## 2017-01-29 NOTE — ED Triage Notes (Signed)
Pt c/o  Generalized weakness x 3 days . Son states syncopal episode x 2 episodes

## 2017-01-29 NOTE — ED Notes (Signed)
Pt on monitor 

## 2017-02-07 ENCOUNTER — Ambulatory Visit (INDEPENDENT_AMBULATORY_CARE_PROVIDER_SITE_OTHER): Payer: Medicaid Other | Admitting: Family Medicine

## 2017-02-07 ENCOUNTER — Encounter: Payer: Self-pay | Admitting: Family Medicine

## 2017-02-07 VITALS — BP 150/77 | HR 109 | Ht 59.0 in | Wt 107.0 lb

## 2017-02-07 DIAGNOSIS — M25551 Pain in right hip: Secondary | ICD-10-CM | POA: Diagnosis not present

## 2017-02-07 DIAGNOSIS — G8929 Other chronic pain: Secondary | ICD-10-CM

## 2017-02-07 DIAGNOSIS — M25511 Pain in right shoulder: Secondary | ICD-10-CM | POA: Diagnosis present

## 2017-02-07 MED ORDER — METHYLPREDNISOLONE ACETATE 40 MG/ML IJ SUSP
40.0000 mg | Freq: Once | INTRAMUSCULAR | Status: AC
  Administered 2017-02-07: 40 mg via INTRA_ARTICULAR

## 2017-02-07 NOTE — Patient Instructions (Signed)
You have a frozen shoulder (adhesive capsulitis), a buildup of scar tissue that limits motion of the shoulder joint, along with the spasticity of muscles surrounding the shoulder blade. Heat 15 minutes at a time 3-4 times a day may help with movement and stiffness. Steroid injections in a series have been shown to help with pain and motion - you were given the second one today Codman exercises (pendulum, wall walking or table slides, arm circles) - do 3 sets of 10 once or twice a day with assistance. Follow up in 1 month - if not improving we can do the final injection.  Your hip pain is due to proximal quad spasms though a more central cause of pain is possible, related to your stroke. Ask the neurologist if botox is an option to try for this as well. While you have some arthritis of your hip this is not what is causing your pain.

## 2017-02-09 DIAGNOSIS — M25551 Pain in right hip: Secondary | ICD-10-CM | POA: Insufficient documentation

## 2017-02-09 NOTE — Assessment & Plan Note (Signed)
no pain with manipulation of joint.  Consistent with spasms proximal hip.  Reviewed stretching that can help but no strength to do other exercises.  Consider botox here.

## 2017-02-09 NOTE — Assessment & Plan Note (Signed)
2/2 adhesive capsulitis, chronic pain s/p CVA.  Spasticity is improved with botox injections though she reports pain has not changed.  Repeated intraarticular injection today.  Heat, motion exercises.  F/u in 1 month.  After informed written consent timeout was performed, patient was seated on exam table. Right shoulder was prepped with alcohol swab and utilizing posterior approach, patient's right glenohumeral space was injected with 3:1 bupivicaine: depomedrol. Patient tolerated the procedure well without immediate complications.

## 2017-02-09 NOTE — Progress Notes (Signed)
PCP: Janyth Pupa, NP  Subjective:   HPI: Patient is a 61 y.o. female here for right shoulder pain.  9/26: Patient here with son and health care worker who helped provide history. They report back in February this year she suffered a stroke that affected her right side. Since then she has struggled with pain on her right side especially in right arm and shoulder. Has tried heat, ibuprofen. She is finished with rehab following her CVA. She is seeing PM&R physician and waiting on approval for botox for spasticity of right upper extremity. She's had a couple sets of radiographs and told she may have fluid in shoulder. No prior right shoulder injuries she's aware of. Pain lateral right shoulder but also severe and sharp into right scapula, neck. Very limited motion of right shoulder. Pain level now 7/10. No skin changes. Lack of sensation in right arm.  10/24: Patient returns with son and health care worker. She reports her shoulder pain has improved some since last visit. Pain still reported at 7/10 and sharp anterior right shoulder though. No improvement in motion. Not doing much home exercises with active assist as she recently had botox injections - she states this has helped with the spasticity but pain feels the same. Also with pain right quad, groin area. No new injuries or trauma. No skin changes.  Past Medical History:  Diagnosis Date  . Chronic arm pain   . Chronic pain syndrome   . Diabetes mellitus   . Heart attack ([place])   . History of right coronary artery stent placement    2 stents placed at separate times  . Hypertension   . Stroke ([place])   . Thyroid disease     Current Outpatient Prescriptions on File Prior to Visit  Medication Sig Dispense Refill  . atorvastatin (LIPITOR) 80 MG tablet Take 80 mg by mouth daily.    . Benzocaine (BOIL-EASE EX) Apply 1 application topically daily as needed (pain from boils).    . benztropine (COGENTIN) 1 MG tablet  Take 1 tablet (1 mg total) by mouth 2 (two) times daily. 4 tablet 0  . clopidogrel (PLAVIX) 75 MG tablet Take 75 mg by mouth daily.    Mariane Baumgarten Calcium (STOOL SOFTENER PO) Take 1 capsule by mouth daily.    . ferrous sulfate 220 (44 Fe) MG/5ML solution Take 220 mg by mouth daily.    . ferrous sulfate 325 (65 FE) MG tablet Take 1 tablet (325 mg total) by mouth 2 (two) times daily with a meal. (Patient not taking: Reported on 12/31/2016) 60 tablet 1  . ibuprofen (ADVIL,MOTRIN) 200 MG tablet Take 100 mg by mouth every 6 (six) hours as needed for headache (pain).    . insulin glargine (LANTUS) 100 UNIT/ML injection Inject 0.75 mLs (75 Units total) into the skin at bedtime. (Patient taking differently: Inject 20 Units into the skin 2 (two) times daily. ) 10 mL 11  . lactulose (CHRONULAC) 10 GM/15ML solution Take 10 g by mouth 2 (two) times daily as needed for mild constipation.    Marland Kitchen levothyroxine (SYNTHROID, LEVOTHROID) 137 MCG tablet Take 137 mcg by mouth daily before breakfast.    . LORazepam (ATIVAN) 0.5 MG tablet Take 0.5 mg by mouth See admin instructions. Take 1 tablet (0.5 mg) by mouth daily at bedtime, may also take 1/2 tablet (0.25 mg) once during the day as needed for panic attacks  1  . ondansetron (ZOFRAN-ODT) 4 MG disintegrating tablet Take 4 mg by mouth  every 6 (six) hours as needed.  2  . polyethylene glycol (MIRALAX) packet Take 17 g by mouth daily. (Patient not taking: Reported on 12/31/2016) 14 each 0  . senna-docusate (DOK PLUS) 8.6-50 MG tablet Take 1 tablet by mouth daily as needed for mild constipation.    Marland Kitchen Spacer/Aero-Holding Chambers (AEROCHAMBER PLUS WITH MASK) inhaler Use as instructed (Patient not taking: Reported on 12/31/2016) 1 each 2  . traMADol (ULTRAM) 50 MG tablet Take 1 tablet (50 mg total) by mouth every 6 (six) hours as needed. (Patient not taking: Reported on 12/31/2016) 15 tablet 0   No current facility-administered medications on file prior to visit.     Past  Surgical History:  Procedure Laterality Date  . ABDOMINAL HYSTERECTOMY     patient denies  . BLADDER SUSPENSION    . LIPOMA EXCISION      Allergies  Allergen Reactions  . Amoxicillin Nausea And Vomiting  . Morphine And Related Nausea And Vomiting and Other (See Comments)    Also passed out  . Percocet [Oxycodone-Acetaminophen] Nausea And Vomiting  . Percodan [Oxycodone-Aspirin] Nausea And Vomiting  . Cymbalta [Duloxetine Hcl] Other (See Comments)    Tremors/uncontrolled movement per son  . Gabapentin Other (See Comments)    Tremors/uncontrolled movements per son  . Penicillins Nausea And Vomiting and Swelling    Tongue swelling Has patient had a PCN reaction causing immediate rash, facial/tongue/throat swelling, SOB or lightheadedness with hypotension: Yes Has patient had a PCN reaction causing severe rash involving mucus membranes or skin necrosis: Unknown Has patient had a PCN reaction that required hospitalization: Unknown Has patient had a PCN reaction occurring within the last 10 years: Unknown If all of the above answers are "NO", then may proceed with Cephalosporin use.  . Torecan [Thiethylperazine] Other (See Comments)    Pt does not recall this medication  . Sulfa Antibiotics Nausea And Vomiting and Rash    Social History   Social History  . Marital status: Single    Spouse name: N/A  . Number of children: N/A  . Years of education: N/A   Occupational History  . Not on file.   Social History Main Topics  . Smoking status: Current Every Day Smoker    Packs/day: 0.50    Types: Cigarettes  . Smokeless tobacco: Never Used  . Alcohol use No  . Drug use: No  . Sexual activity: Not on file   Other Topics Concern  . Not on file   Social History Narrative  . No narrative on file    Family History  Problem Relation Age of Onset  . Diabetes Father   . Diabetes Sister   . Diabetes Brother     BP (!) 150/77   Pulse (!) 109   Ht 4\' 11"  (1.499 m)   Wt 107  lb (48.5 kg)   BMI 21.61 kg/m   Review of Systems: See HPI above.     Objective:  Physical Exam:  Gen: NAD, comfortable in exam room.  Right shoulder: No swelling, ecchymoses.  Diffuse muscle atrophy.  Shoulder feels mildly subluxed anteriorly. Mild TTP anterior > posterior shoulder. Passive motion improved - abduction and flexion to 60 degrees, full IR, 30 ER.   Spasticity much improved throughout right upper extremity. No strength on attempted resisted motions.  Left shoulder: FROM without pain.  Right hip: Mild limitation IR but no pain. No gross deformity, swelling. Spasm proximal quad compared to left hip. Negative logroll.   Assessment & Plan:  1. Right shoulder pain - 2/2 adhesive capsulitis, chronic pain s/p CVA.  Spasticity is improved with botox injections though she reports pain has not changed.  Repeated intraarticular injection today.  Heat, motion exercises.  F/u in 1 month.  After informed written consent timeout was performed, patient was seated on exam table. Right shoulder was prepped with alcohol swab and utilizing posterior approach, patient's right glenohumeral space was injected with 3:1 bupivicaine: depomedrol. Patient tolerated the procedure well without immediate complications.  2. Right hip pain - no pain with manipulation of joint.  Consistent with spasms proximal hip.  Reviewed stretching that can help but no strength to do other exercises.  Consider botox here.

## 2017-02-22 DIAGNOSIS — G459 Transient cerebral ischemic attack, unspecified: Secondary | ICD-10-CM | POA: Insufficient documentation

## 2017-03-05 ENCOUNTER — Ambulatory Visit: Payer: Medicaid Other | Admitting: Family Medicine

## 2017-03-05 ENCOUNTER — Encounter: Payer: Self-pay | Admitting: Family Medicine

## 2017-03-05 ENCOUNTER — Ambulatory Visit (HOSPITAL_BASED_OUTPATIENT_CLINIC_OR_DEPARTMENT_OTHER)
Admission: RE | Admit: 2017-03-05 | Discharge: 2017-03-05 | Disposition: A | Discharge status: Home / Self Care | Payer: Medicaid Other | Source: Ambulatory Visit | Attending: Family Medicine | Admitting: Family Medicine

## 2017-03-05 VITALS — Ht 59.0 in | Wt 107.0 lb

## 2017-03-05 DIAGNOSIS — G8929 Other chronic pain: Secondary | ICD-10-CM | POA: Diagnosis not present

## 2017-03-05 DIAGNOSIS — M24411 Recurrent dislocation, right shoulder: Secondary | ICD-10-CM | POA: Insufficient documentation

## 2017-03-05 DIAGNOSIS — M160 Bilateral primary osteoarthritis of hip: Secondary | ICD-10-CM | POA: Diagnosis not present

## 2017-03-05 DIAGNOSIS — M25511 Pain in right shoulder: Secondary | ICD-10-CM

## 2017-03-05 DIAGNOSIS — M25551 Pain in right hip: Secondary | ICD-10-CM

## 2017-03-05 MED ORDER — METHYLPREDNISOLONE ACETATE 40 MG/ML IJ SUSP
40.0000 mg | Freq: Once | INTRAMUSCULAR | Status: AC
  Administered 2017-03-05: 40 mg via INTRA_ARTICULAR

## 2017-03-05 NOTE — Patient Instructions (Signed)
We gave you the final intraarticular injection today. I would recommend you see a pain management physician - I think the perception of pain related to your stroke is more responsible for your pain issues and less so true shoulder or hip pathology.

## 2017-03-06 ENCOUNTER — Encounter: Payer: Self-pay | Admitting: Family Medicine

## 2017-03-06 NOTE — Assessment & Plan Note (Signed)
independently reviewed radiographs and no acute abnormalities.  Mild degenerative changes.  Believe this is also related to perception of pain, related to stroke.  She has spasm here though treatment with botox for other spasms haven't helped with her pain level.

## 2017-03-06 NOTE — Assessment & Plan Note (Signed)
independently reviewed repeat radiographs and no changes from prior - still with chronic subluxation.  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 We finished intraarticular series today.  I advised her, caregiver, son that I suspect her pain is not orthopedic in nature and more related to stroke and perception of pain being altered relative to stroke.  At this point I would recommend following up with pain management physician and/or neurology to see if any options exist for treatment from their side.  After informed written consent timeout was performed, patient was seated on exam table. Right shoulder was prepped with alcohol swab and utilizing posterior approach, patient's right glenohumeral space was injected with 3:1 bupivicaine: depomedrol. Patient tolerated the procedure well without immediate complications.

## 2017-03-06 NOTE — Progress Notes (Signed)
PCP: Janyth Pupa, NP  Subjective:   HPI: Patient is a 61 y.o. female here for right shoulder pain.  9/26: Patient here with son and health care worker who helped provide history. They report back in February this year she suffered a stroke that affected her right side. Since then she has struggled with pain on her right side especially in right arm and shoulder. Has tried heat, ibuprofen. She is finished with rehab following her CVA. She is seeing PM&R physician and waiting on approval for botox for spasticity of right upper extremity. She's had a couple sets of radiographs and told she may have fluid in shoulder. No prior right shoulder injuries she's aware of. Pain lateral right shoulder but also severe and sharp into right scapula, neck. Very limited motion of right shoulder. Pain level now 7/10. No skin changes. Lack of sensation in right arm.  10/24: Patient returns with son and health care worker. She reports her shoulder pain has improved some since last visit. Pain still reported at 7/10 and sharp anterior right shoulder though. No improvement in motion. Not doing much home exercises with active assist as she recently had botox injections - she states this has helped with the spasticity but pain feels the same. Also with pain right quad, groin area. No new injuries or trauma. No skin changes.  11/20: Patient here with caregiver. They report not much improvement since last visit. Getting 10/10 level pain in right shoulder radiating down arm as well as same level pain, sharp anterior right hip. Still not noticed improvement in pain level with shoulder intraarticular injections nor the botox despite spasticity being improved. She is doing physical therapy. No new injuries No skin changes, new numbness.  Past Medical History:  Diagnosis Date  . Chronic arm pain   . Chronic pain syndrome   . Diabetes mellitus   . Heart attack ([place])   . History of right  coronary artery stent placement    2 stents placed at separate times  . Hypertension   . Stroke ([place])   . Thyroid disease     Current Outpatient Medications on File Prior to Visit  Medication Sig Dispense Refill  . citalopram (CELEXA) 10 MG tablet Take 10 mg by mouth.    . insulin glargine (LANTUS) 100 UNIT/ML injection Inject into the skin.    . Insulin Syringe-Needle U-100 31G X 1/4" 1 ML MISC 1 Dose by Misc.(Non-Drug; Combo Route) route daily. Or as directed to inject insulin under the skin    . ACCU-CHEK AVIVA PLUS test strip USE TO TEST BLOOD SUGAR UP TO QID  3  . ACCU-CHEK SOFTCLIX LANCETS lancets TEST UP TO QID  2  . aspirin EC 81 MG tablet Take 81 mg by mouth.    Marland Kitchen atorvastatin (LIPITOR) 80 MG tablet Take 80 mg by mouth daily.    . Benzocaine (BOIL-EASE EX) Apply 1 application topically daily as needed (pain from boils).    . benztropine (COGENTIN) 1 MG tablet Take 1 tablet (1 mg total) by mouth 2 (two) times daily. 4 tablet 0  . clopidogrel (PLAVIX) 75 MG tablet Take 75 mg by mouth daily.    Marland Kitchen docusate calcium (SURFAK) 240 MG capsule Take by mouth.    . ferrous sulfate 220 (44 Fe) MG/5ML solution Take 220 mg by mouth daily.    . ferrous sulfate 325 (65 FE) MG tablet Take 1 tablet (325 mg total) by mouth 2 (two) times daily with a meal. (Patient  not taking: Reported on 12/31/2016) 60 tablet 1  . gabapentin (NEURONTIN) 100 MG capsule as needed. 50mg  ?    . glipiZIDE (GLUCOTROL) 5 MG tablet Take by mouth.    Marland Kitchen ibuprofen (ADVIL,MOTRIN) 200 MG tablet Take 100 mg by mouth every 6 (six) hours as needed for headache (pain).    Marland Kitchen lactulose (CHRONULAC) 10 GM/15ML solution Take 10 g by mouth 2 (two) times daily as needed for mild constipation.    Marland Kitchen levothyroxine (SYNTHROID, LEVOTHROID) 137 MCG tablet Take 137 mcg by mouth daily before breakfast.    . LORazepam (ATIVAN) 0.5 MG tablet Take 0.5 mg by mouth See admin instructions. Take 1 tablet (0.5 mg) by mouth daily at bedtime, may also take  1/2 tablet (0.25 mg) once during the day as needed for panic attacks  1  . Metoprolol Succinate 25 MG CS24 metoprolol succinate ER 25 mg capsule sprinkle, ext. release 24 hr  Take 1 capsule every day by oral route.    . ondansetron (ZOFRAN-ODT) 4 MG disintegrating tablet Take 4 mg by mouth every 6 (six) hours as needed.  2  . polyethylene glycol (MIRALAX) packet Take 17 g by mouth daily. (Patient not taking: Reported on 12/31/2016) 14 each 0  . senna-docusate (DOK PLUS) 8.6-50 MG tablet Take 1 tablet by mouth daily as needed for mild constipation.    Marland Kitchen Spacer/Aero-Holding Chambers (AEROCHAMBER PLUS WITH MASK) inhaler Use as instructed (Patient not taking: Reported on 12/31/2016) 1 each 2  . traMADol (ULTRAM) 50 MG tablet Take 1 tablet (50 mg total) by mouth every 6 (six) hours as needed. (Patient not taking: Reported on 12/31/2016) 15 tablet 0   No current facility-administered medications on file prior to visit.     Past Surgical History:  Procedure Laterality Date  . ABDOMINAL HYSTERECTOMY     patient denies  . BLADDER SUSPENSION    . LIPOMA EXCISION      Allergies  Allergen Reactions  . Amoxicillin Nausea And Vomiting  . Morphine And Related Nausea And Vomiting and Other (See Comments)    Also passed out  . Percocet [Oxycodone-Acetaminophen] Nausea And Vomiting  . Percodan [Oxycodone-Aspirin] Nausea And Vomiting  . Cymbalta [Duloxetine Hcl] Other (See Comments)    Tremors/uncontrolled movement per son  . Gabapentin Other (See Comments)    Tremors/uncontrolled movements per son  . Penicillins Nausea And Vomiting and Swelling    Tongue swelling Has patient had a PCN reaction causing immediate rash, facial/tongue/throat swelling, SOB or lightheadedness with hypotension: Yes Has patient had a PCN reaction causing severe rash involving mucus membranes or skin necrosis: Unknown Has patient had a PCN reaction that required hospitalization: Unknown Has patient had a PCN reaction occurring  within the last 10 years: Unknown If all of the above answers are "NO", then may proceed with Cephalosporin use.  . Torecan [Thiethylperazine] Other (See Comments)    Pt does not recall this medication  . Sulfa Antibiotics Nausea And Vomiting and Rash    Social History   Socioeconomic History  . Marital status: Single    Spouse name: Not on file  . Number of children: Not on file  . Years of education: Not on file  . Highest education level: Not on file  Social Needs  . Financial resource strain: Not on file  . Food insecurity - worry: Not on file  . Food insecurity - inability: Not on file  . Transportation needs - medical: Not on file  . Transportation needs - non-medical:  Not on file  Occupational History  . Not on file  Tobacco Use  . Smoking status: Current Every Day Smoker    Packs/day: 0.50    Types: Cigarettes  . Smokeless tobacco: Never Used  Substance and Sexual Activity  . Alcohol use: No  . Drug use: No  . Sexual activity: Not on file  Other Topics Concern  . Not on file  Social History Narrative  . Not on file    Family History  Problem Relation Age of Onset  . Diabetes Father   . Diabetes Sister   . Diabetes Brother     Ht 4\' 11"  (1.499 m)   Wt 107 lb (48.5 kg)   BMI 21.61 kg/m   Review of Systems: See HPI above.     Objective:  Physical Exam:  Gen: NAD, sitting in wheelchair, uncomfortable initially though improved after return from x-ray, more comfortable.  Right shoulder: No swelling, ecchymoses.  Diffuse muscle atrophy. Mild subluxation anteriorly noted but unchanged. Mild TTP diffusely about shoulder. Guarding against passive motion - full IR noted, 30 ER, abduction and flexion to 30 degrees. No active motion of shoulder. Mild spasticity now, slightly worse than last visit but overall improved from initial visit.  Left shoulder: FROM without pain.  Right hip: No deformity, swelling.  Spasm felt proximal quad. Mild limitation  IR. Negative logroll.   Assessment & Plan:  1. Right shoulder pain - independently reviewed repeat radiographs and no changes from prior - still with chronic subluxation.  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 We finished intraarticular series today.  I advised her, caregiver, son that I suspect her pain is not orthopedic in nature and more related to stroke and perception of pain being altered relative to stroke.  At this point I would recommend following up with pain management physician and/or neurology to see if any options exist for treatment from their side.  After informed written consent timeout was performed, patient was seated on exam table. Right shoulder was prepped with alcohol swab and utilizing posterior approach, patient's right glenohumeral space was injected with 3:1 bupivicaine: depomedrol. Patient tolerated the procedure well without immediate complications.  2. Right hip pain - independently reviewed radiographs and no acute abnormalities.  Mild degenerative changes.  Believe this is also related to perception of pain, related to stroke.  She has spasm here though treatment with botox for other spasms haven't helped with her pain level.

## 2017-03-07 ENCOUNTER — Ambulatory Visit: Payer: Medicaid Other | Admitting: Family Medicine

## 2017-04-09 ENCOUNTER — Encounter (HOSPITAL_BASED_OUTPATIENT_CLINIC_OR_DEPARTMENT_OTHER): Payer: Self-pay | Admitting: Adult Health

## 2017-04-09 ENCOUNTER — Other Ambulatory Visit: Payer: Self-pay

## 2017-04-09 ENCOUNTER — Emergency Department (HOSPITAL_BASED_OUTPATIENT_CLINIC_OR_DEPARTMENT_OTHER)
Admission: EM | Admit: 2017-04-09 | Discharge: 2017-04-09 | Disposition: A | Discharge status: Home / Self Care | Payer: Medicaid Other | Attending: Emergency Medicine | Admitting: Emergency Medicine

## 2017-04-09 DIAGNOSIS — E039 Hypothyroidism, unspecified: Secondary | ICD-10-CM | POA: Insufficient documentation

## 2017-04-09 DIAGNOSIS — R21 Rash and other nonspecific skin eruption: Secondary | ICD-10-CM | POA: Diagnosis not present

## 2017-04-09 DIAGNOSIS — F17210 Nicotine dependence, cigarettes, uncomplicated: Secondary | ICD-10-CM | POA: Diagnosis not present

## 2017-04-09 DIAGNOSIS — I2510 Atherosclerotic heart disease of native coronary artery without angina pectoris: Secondary | ICD-10-CM | POA: Diagnosis not present

## 2017-04-09 DIAGNOSIS — Z955 Presence of coronary angioplasty implant and graft: Secondary | ICD-10-CM | POA: Insufficient documentation

## 2017-04-09 DIAGNOSIS — Z853 Personal history of malignant neoplasm of breast: Secondary | ICD-10-CM | POA: Insufficient documentation

## 2017-04-09 DIAGNOSIS — M79601 Pain in right arm: Secondary | ICD-10-CM | POA: Diagnosis present

## 2017-04-09 DIAGNOSIS — E119 Type 2 diabetes mellitus without complications: Secondary | ICD-10-CM | POA: Insufficient documentation

## 2017-04-09 MED ORDER — FENTANYL CITRATE (PF) 100 MCG/2ML IJ SOLN
50.0000 ug | Freq: Once | INTRAMUSCULAR | Status: AC
  Administered 2017-04-09: 50 ug via INTRAMUSCULAR
  Filled 2017-04-09: qty 2

## 2017-04-09 NOTE — Discharge Instructions (Signed)
Call your pain doctors when they open next to schedule the next available appointment. Apply lotion to the area on the buttock.

## 2017-04-09 NOTE — ED Provider Notes (Signed)
Emergency Department Provider Note   I have reviewed the triage vital signs and the nursing notes.   HISTORY  Chief Complaint right sided body pain   HPI Laura Oconnor is a 61 y.o. female with PMH of DM, HTN, CVA, and chronic right sided pain presents to the emergency department for evaluation of pain in the entire right side of her body.  She is followed by a pain management specialist, neurologist, primary care physician for this issue.  She has multiple allergies to opiate medications.  She is tried muscle relaxers with no relief.  She is undergoing Botox and Marcaine injections with her neurologist and pain management specialist.  Patient's son has noticed a small area of rash on the patient's right buttock.  He states it was larger and slightly red but has seemed to decrease in size.  There is some flaking of the skin overlying this area currently. No fever or chills.    Past Medical History:  Diagnosis Date  . Chronic arm pain   . Chronic pain syndrome   . Diabetes mellitus   . Heart attack ([place])   . History of right coronary artery stent placement    2 stents placed at separate times  . Hypertension   . Stroke ([place])   . Thyroid disease     Patient Active Problem List   Diagnosis Date Noted  . TIA (transient ischemic attack) 02/22/2017  . Right hip pain 02/09/2017  . Right shoulder pain 01/13/2017  . History of CVA with residual deficit 10/16/2016  . Weakness as late effect of cerebrovascular accident (CVA) 08/09/2016  . Depression with anxiety 08/04/2016  . Hemiplegia affecting right dominant side ([place]) 05/31/2016  . Hypokalemia 08/13/2014  . Diarrhea 08/13/2014  . Loss of weight 08/13/2014  . Hypocalcemia 08/13/2014  . Atrophic vaginitis 08/13/2014  . Type 2 diabetes mellitus, uncontrolled ([place]) 08/13/2014  . Breast cancer ([place]) 08/13/2014  . Hypothyroidism 08/13/2014  . CAD (coronary artery disease) 08/13/2014    Past Surgical History:  Procedure  Laterality Date  . ABDOMINAL HYSTERECTOMY     patient denies  . BLADDER SUSPENSION    . LIPOMA EXCISION      Current Outpatient Rx  . Order #: [id number] Class: Historical Med  . Order #: [id number] Class: Historical Med  . Order #: [id number] Class: Historical Med  . Order #: [id number] Class: Historical Med  . Order #: [id number] Class: Historical Med  . Order #: [id number] Class: Historical Med  . Order #: [id number] Class: Historical Med  . Order #: [id number] Class: Historical Med  . Order #: [id number] Class: Historical Med  . Order #: [id number] Class: Print  . Order #: [id number] Class: Historical Med  . Order #: [id number] Class: Historical Med  . Order #: [id number] Class: Historical Med  . Order #: [id number] Class: Print  . Order #: [id number] Class: Historical Med  . Order #: [id number] Class: Historical Med  . Order #: [id number] Class: Historical Med  . Order #: [id number] Class: Print  . Order #: [id number] Class: Historical Med  . Order #: [id number] Class: Historical Med  . Order #: [id number] Class: Historical Med  . Order #: [id number] Class: Historical Med  . Order #: [id number] Class: Historical Med  . Order #: [id number] Class: Print  . Order #: [id number] Class: Print    Allergies Amoxicillin; Morphine and related; Percocet [oxycodone-acetaminophen]; Percodan [oxycodone-aspirin]; Cymbalta [duloxetine hcl]; Gabapentin; Penicillins; Torecan [thiethylperazine]; and Sulfa antibiotics  Family History  Problem Relation Age of Onset  . Diabetes Father   . Diabetes Sister   . Diabetes Brother     Social  History Social History   Tobacco Use  . Smoking status: Current Every Day Smoker    Packs/day: 0.50    Types: Cigarettes  . Smokeless tobacco: Never Used  Substance Use Topics  . Alcohol use: No  . Drug use: No    Review of Systems  Constitutional: No fever/chills Eyes: No visual changes. ENT: No sore throat. Cardiovascular: Denies chest pain. Respiratory: Denies shortness of  breath. Gastrointestinal: No abdominal pain.  No nausea, no vomiting.  No diarrhea.  No constipation. Genitourinary: Negative for dysuria. Musculoskeletal: Negative for back pain. Positive right sided body pain.  Skin: Positive for rash. Neurological: Negative for headaches, focal weakness or numbness.  10-point ROS otherwise negative.  ____________________________________________   PHYSICAL EXAM:  VITAL SIGNS: Temp: 98.6 F Pulse: 78 BP: 135/79 SpO2: 98% RA    Constitutional: Alert and oriented. Chronically ill-appearing but in no acute distress.  Eyes: Conjunctivae are normal.  Head: Atraumatic. Nose: No congestion/rhinnorhea. Mouth/Throat: Mucous membranes are moist.  Neck: No stridor.  Cardiovascular: Normal rate, regular rhythm. Good peripheral circulation. Grossly normal heart sounds.   Respiratory: Normal respiratory effort.  No retractions. Lungs CTAB. Gastrointestinal: Soft and nontender. No distention.  Musculoskeletal: Contracted RUE. Limited ROM over all hip, knee, shoulder, elbow, and wrist on the right.  Neurologic:  Baseline dysarthria and right face droop noted. Spastic paralysis of the RUE and slightly increased strength in the RLE.  Skin:  Skin is warm, dry and intact. Faint, 1 cm area of dry skin in the right superior gluteal cleft. No erythema, tenderness, fluctuance, or induration noted.   ____________________________________________  RADIOLOGY  None ____________________________________________   PROCEDURES  Procedure(s) performed:   Procedures  None ____________________________________________   INITIAL IMPRESSION / ASSESSMENT AND PLAN / ED COURSE  Pertinent labs & imaging results that were available during my care of the patient were reviewed by me and considered in my medical decision making (see chart for details).  Patient presents emergency department for evaluation of chronic right-sided pain.  In the past, she states that fentanyl  is worked well for her pain.  I discussed that I be happy to provide her some fentanyl here in the emergency department but I cannot discharge her home with this medication.  She understands.  The area on the buttock looks like a healing area of possible cellulitis.  Does not appear acutely infected.  No fluctuance or evidence of abscess.  Plan for fentanyl and have the patient follow-up with her pain management clinic for ongoing treatment. Son at bedside is in agreement with plan.   At this time, I do not feel there is any life-threatening condition present. I have reviewed and discussed all results (EKG, imaging, lab, urine as appropriate), exam findings with patient. I have reviewed nursing notes and appropriate previous records.  I feel the patient is safe to be discharged home without further emergent workup. Discussed usual and customary return precautions. Patient and family (if present) verbalize understanding and are comfortable with this plan.  Patient will follow-up with their primary care provider. If they do not have a primary care provider, information for follow-up has been provided to them. All questions have been answered.  ____________________________________________  FINAL CLINICAL IMPRESSION(S) / ED DIAGNOSES  Final diagnoses:  Right arm pain  Rash     MEDICATIONS GIVEN DURING THIS VISIT:  Medications  fentaNYL (SUBLIMAZE) injection 50 mcg (50 mcg Intramuscular Given 04/09/17 1819)     NEW OUTPATIENT MEDICATIONS STARTED DURING THIS VISIT:  None  Note:  This document was prepared using Dragon voice recognition software and may include unintentional dictation errors.  Nanda Quinton, MD Emergency Medicine    Cherilynn Schomburg, [facility], MD 04/09/17 2150

## 2017-04-09 NOTE — ED Triage Notes (Addendum)
Presents with right side pain of the whole right side. SHe is post stroke from February, this pain is chronic. Nothing is helping. 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 They called the pain clinic but it was closed today. She has ahd botox injections and cortisone injections with relief. Ibuprofen taken today did not help. Pt states she has a sore on her bottom, her son states it is a rash on the right side cheek of her bottom.

## 2017-04-26 ENCOUNTER — Observation Stay (HOSPITAL_COMMUNITY)
Admission: EM | Admit: 2017-04-26 | Discharge: 2017-04-27 | Discharge status: Against Medical Advice | Payer: Medicaid Other | Attending: Oncology | Admitting: Oncology

## 2017-04-26 ENCOUNTER — Encounter (HOSPITAL_COMMUNITY): Payer: Self-pay

## 2017-04-26 ENCOUNTER — Other Ambulatory Visit: Payer: Self-pay

## 2017-04-26 DIAGNOSIS — F418 Other specified anxiety disorders: Secondary | ICD-10-CM | POA: Diagnosis not present

## 2017-04-26 DIAGNOSIS — Z7982 Long term (current) use of aspirin: Secondary | ICD-10-CM | POA: Insufficient documentation

## 2017-04-26 DIAGNOSIS — F17210 Nicotine dependence, cigarettes, uncomplicated: Secondary | ICD-10-CM | POA: Diagnosis not present

## 2017-04-26 DIAGNOSIS — I10 Essential (primary) hypertension: Secondary | ICD-10-CM | POA: Diagnosis not present

## 2017-04-26 DIAGNOSIS — Z882 Allergy status to sulfonamides status: Secondary | ICD-10-CM

## 2017-04-26 DIAGNOSIS — E039 Hypothyroidism, unspecified: Secondary | ICD-10-CM | POA: Insufficient documentation

## 2017-04-26 DIAGNOSIS — Z720 Tobacco use: Secondary | ICD-10-CM

## 2017-04-26 DIAGNOSIS — M7501 Adhesive capsulitis of right shoulder: Secondary | ICD-10-CM | POA: Insufficient documentation

## 2017-04-26 DIAGNOSIS — Z5321 Procedure and treatment not carried out due to patient leaving prior to being seen by health care provider: Secondary | ICD-10-CM | POA: Insufficient documentation

## 2017-04-26 DIAGNOSIS — E119 Type 2 diabetes mellitus without complications: Secondary | ICD-10-CM | POA: Diagnosis not present

## 2017-04-26 DIAGNOSIS — I69322 Dysarthria following cerebral infarction: Secondary | ICD-10-CM

## 2017-04-26 DIAGNOSIS — Z794 Long term (current) use of insulin: Secondary | ICD-10-CM

## 2017-04-26 DIAGNOSIS — D649 Anemia, unspecified: Secondary | ICD-10-CM

## 2017-04-26 DIAGNOSIS — I252 Old myocardial infarction: Secondary | ICD-10-CM | POA: Insufficient documentation

## 2017-04-26 DIAGNOSIS — Z885 Allergy status to narcotic agent status: Secondary | ICD-10-CM | POA: Insufficient documentation

## 2017-04-26 DIAGNOSIS — G90511 Complex regional pain syndrome I of right upper limb: Secondary | ICD-10-CM | POA: Diagnosis not present

## 2017-04-26 DIAGNOSIS — Z79890 Hormone replacement therapy (postmenopausal): Secondary | ICD-10-CM

## 2017-04-26 DIAGNOSIS — Z955 Presence of coronary angioplasty implant and graft: Secondary | ICD-10-CM | POA: Diagnosis not present

## 2017-04-26 DIAGNOSIS — Z765 Malingerer [conscious simulation]: Secondary | ICD-10-CM | POA: Insufficient documentation

## 2017-04-26 DIAGNOSIS — Z880 Allergy status to penicillin: Secondary | ICD-10-CM | POA: Diagnosis not present

## 2017-04-26 DIAGNOSIS — E785 Hyperlipidemia, unspecified: Secondary | ICD-10-CM | POA: Insufficient documentation

## 2017-04-26 DIAGNOSIS — M25511 Pain in right shoulder: Secondary | ICD-10-CM

## 2017-04-26 DIAGNOSIS — G894 Chronic pain syndrome: Secondary | ICD-10-CM | POA: Insufficient documentation

## 2017-04-26 DIAGNOSIS — Z993 Dependence on wheelchair: Secondary | ICD-10-CM

## 2017-04-26 DIAGNOSIS — D509 Iron deficiency anemia, unspecified: Principal | ICD-10-CM | POA: Insufficient documentation

## 2017-04-26 DIAGNOSIS — Z7902 Long term (current) use of antithrombotics/antiplatelets: Secondary | ICD-10-CM | POA: Diagnosis not present

## 2017-04-26 DIAGNOSIS — R000 Tachycardia, unspecified: Secondary | ICD-10-CM | POA: Diagnosis not present

## 2017-04-26 DIAGNOSIS — Z79899 Other long term (current) drug therapy: Secondary | ICD-10-CM | POA: Insufficient documentation

## 2017-04-26 DIAGNOSIS — Z79891 Long term (current) use of opiate analgesic: Secondary | ICD-10-CM

## 2017-04-26 DIAGNOSIS — Z9114 Patient's other noncompliance with medication regimen: Secondary | ICD-10-CM | POA: Insufficient documentation

## 2017-04-26 DIAGNOSIS — Z888 Allergy status to other drugs, medicaments and biological substances status: Secondary | ICD-10-CM

## 2017-04-26 DIAGNOSIS — Z9889 Other specified postprocedural states: Secondary | ICD-10-CM

## 2017-04-26 DIAGNOSIS — I69351 Hemiplegia and hemiparesis following cerebral infarction affecting right dominant side: Secondary | ICD-10-CM | POA: Diagnosis not present

## 2017-04-26 DIAGNOSIS — I2510 Atherosclerotic heart disease of native coronary artery without angina pectoris: Secondary | ICD-10-CM | POA: Insufficient documentation

## 2017-04-26 DIAGNOSIS — E876 Hypokalemia: Secondary | ICD-10-CM | POA: Diagnosis not present

## 2017-04-26 DIAGNOSIS — Z833 Family history of diabetes mellitus: Secondary | ICD-10-CM

## 2017-04-26 DIAGNOSIS — Z90710 Acquired absence of both cervix and uterus: Secondary | ICD-10-CM

## 2017-04-26 HISTORY — DX: Pressure ulcer of unspecified site, unspecified stage: L89.90

## 2017-04-26 LAB — POC OCCULT BLOOD, ED: Fecal Occult Bld: NEGATIVE

## 2017-04-26 LAB — URINALYSIS, ROUTINE W REFLEX MICROSCOPIC
Bilirubin Urine: NEGATIVE
Glucose, UA: NEGATIVE mg/dL
Hgb urine dipstick: NEGATIVE
Ketones, ur: NEGATIVE mg/dL
LEUKOCYTES UA: NEGATIVE
Nitrite: NEGATIVE
PROTEIN: NEGATIVE mg/dL
SPECIFIC GRAVITY, URINE: 1.005 (ref 1.005–1.030)
pH: 7 (ref 5.0–8.0)

## 2017-04-26 LAB — BASIC METABOLIC PANEL
ANION GAP: 9 (ref 5–15)
BUN: 6 mg/dL (ref 6–20)
CO2: 25 mmol/L (ref 22–32)
CREATININE: 0.37 mg/dL — AB (ref 0.44–1.00)
Calcium: 9.1 mg/dL (ref 8.9–10.3)
Chloride: 104 mmol/L (ref 101–111)
GFR calc non Af Amer: >60 mL/min (ref >60)
Glucose, Bld: 173 mg/dL — ABNORMAL HIGH (ref 65–99)
Potassium: 3.4 mmol/L — ABNORMAL LOW (ref 3.5–5.1)
SODIUM: 138 mmol/L (ref 135–145)

## 2017-04-26 LAB — CBC
HCT: 23.8 % — ABNORMAL LOW (ref 36.0–46.0)
HEMOGLOBIN: 6.6 g/dL — AB (ref 12.0–15.0)
MCH: 19.2 pg — AB (ref 26.0–34.0)
MCHC: 27.7 g/dL — ABNORMAL LOW (ref 30.0–36.0)
MCV: 69.4 fL — ABNORMAL LOW (ref 78.0–100.0)
PLATELETS: 358 10*3/uL (ref 150–400)
RBC: 3.43 MIL/uL — AB (ref 3.87–5.11)
RDW: 16.9 % — ABNORMAL HIGH (ref 11.5–15.5)
WBC: 7.5 10*3/uL (ref 4.0–10.5)

## 2017-04-26 LAB — CBG MONITORING, ED: GLUCOSE-CAPILLARY: 133 mg/dL — AB (ref 65–99)

## 2017-04-26 LAB — GLUCOSE, CAPILLARY: GLUCOSE-CAPILLARY: 194 mg/dL — AB (ref 65–99)

## 2017-04-26 LAB — PREPARE RBC (CROSSMATCH)

## 2017-04-26 MED ORDER — INSULIN ASPART 100 UNIT/ML ~~LOC~~ SOLN: 0.0000 [IU] | Freq: Three times a day (TID) | SUBCUTANEOUS | Status: DC

## 2017-04-26 MED ORDER — POTASSIUM CHLORIDE 20 MEQ PO PACK
40.0000 meq | PACK | Freq: Once | ORAL | Status: AC
  Administered 2017-04-27: 40 meq via ORAL
  Filled 2017-04-26 (×2): qty 2

## 2017-04-26 MED ORDER — CLOPIDOGREL BISULFATE 75 MG PO TABS: 75.0000 mg | ORAL_TABLET | Freq: Every day | ORAL | Status: DC

## 2017-04-26 MED ORDER — FENTANYL CITRATE (PF) 100 MCG/2ML IJ SOLN
12.5000 ug | Freq: Once | INTRAMUSCULAR | Status: AC
  Administered 2017-04-26: 12.5 ug via INTRAMUSCULAR
  Filled 2017-04-26: qty 2

## 2017-04-26 MED ORDER — ONDANSETRON HCL 4 MG/2ML IJ SOLN
4.0000 mg | Freq: Once | INTRAMUSCULAR | Status: DC
  Filled 2017-04-26: qty 2

## 2017-04-26 MED ORDER — SENNOSIDES-DOCUSATE SODIUM 8.6-50 MG PO TABS: 1.0000 | ORAL_TABLET | Freq: Every evening | ORAL | Status: DC | PRN

## 2017-04-26 MED ORDER — SODIUM CHLORIDE 0.9 % IV SOLN
INTRAVENOUS | Status: DC
  Administered 2017-04-26: 21:00:00 via INTRAVENOUS

## 2017-04-26 MED ORDER — LEVOTHYROXINE SODIUM 25 MCG PO TABS: 137.0000 ug | ORAL_TABLET | Freq: Every day | ORAL | Status: DC

## 2017-04-26 MED ORDER — ONDANSETRON HCL 4 MG/2ML IJ SOLN
4.0000 mg | Freq: Once | INTRAMUSCULAR | Status: AC
  Administered 2017-04-26: 4 mg via INTRAVENOUS
  Filled 2017-04-26: qty 2

## 2017-04-26 MED ORDER — TRAMADOL HCL 50 MG PO TABS
50.0000 mg | ORAL_TABLET | Freq: Once | ORAL | Status: AC
  Administered 2017-04-26: 50 mg via ORAL
  Filled 2017-04-26: qty 1

## 2017-04-26 MED ORDER — ACETAMINOPHEN 650 MG RE SUPP: 650.0000 mg | Freq: Four times a day (QID) | RECTAL | Status: DC | PRN

## 2017-04-26 MED ORDER — ACETAMINOPHEN 325 MG PO TABS: 650.0000 mg | ORAL_TABLET | Freq: Four times a day (QID) | ORAL | Status: DC | PRN

## 2017-04-26 MED ORDER — ASPIRIN EC 81 MG PO TBEC: 81.0000 mg | DELAYED_RELEASE_TABLET | Freq: Every day | ORAL | Status: DC

## 2017-04-26 MED ORDER — LORAZEPAM 1 MG PO TABS: 0.5000 mg | ORAL_TABLET | Freq: Three times a day (TID) | ORAL | Status: DC | PRN

## 2017-04-26 MED ORDER — SODIUM CHLORIDE 0.9 % IV SOLN
Freq: Once | INTRAVENOUS | Status: AC
  Administered 2017-04-26: 17:00:00 via INTRAVENOUS

## 2017-04-26 MED ORDER — INSULIN GLARGINE 100 UNIT/ML ~~LOC~~ SOLN
10.0000 [IU] | Freq: Two times a day (BID) | SUBCUTANEOUS | Status: DC
  Administered 2017-04-26: 10 [IU] via SUBCUTANEOUS
  Filled 2017-04-26 (×2): qty 0.1

## 2017-04-26 MED ORDER — DICLOFENAC SODIUM 1 % TD GEL
2.0000 g | Freq: Two times a day (BID) | TRANSDERMAL | Status: DC | PRN
  Filled 2017-04-26: qty 100

## 2017-04-26 MED ORDER — LORAZEPAM 2 MG/ML IJ SOLN
0.5000 mg | Freq: Once | INTRAMUSCULAR | Status: AC
Start: 2017-04-26 — End: 2017-04-26
  Administered 2017-04-26: 0.5 mg via INTRAMUSCULAR
  Filled 2017-04-26: qty 1

## 2017-04-26 NOTE — ED Provider Notes (Signed)
[facility] EMERGENCY DEPARTMENT Provider Note   CSN: [id number] Arrival date & time: 04/26/17  1425     History   Chief Complaint Chief Complaint  Patient presents with  . Weakness    HPI Laura Oconnor is a 62 y.o. female with past medical history of CVA with residual right-sided weakness, uncontrolled type 2 diabetes, anemia, MI, hypertension, thyroid disease, presenting to the ED 3 days of generalized weakness and fatigue.  Patient states she feels as though her blood level is low and she needs a transfusion.  She compares this to previous admission and April 2016 for acute anemia, which wass suspected to be likely anemia of chronic disease.  She was transfused during that admission; was not have found to have GI bleed or other acute cause of anemia.  States she has a low appetite and has had had decreased p.o. intake.  Denies recent falls, melena or bright red stool, hematuria, chest pain, shortness of breath, headache, or other complaints.  Patient presents with her caregiver.  The history is provided by the patient and a caregiver.    Past Medical History:  Diagnosis Date  . Chronic arm pain   . Chronic pain syndrome   . Decubital ulcer   . Diabetes mellitus   . Heart attack ([place])   . History of right coronary artery stent placement    2 stents placed at separate times  . Hypertension   . Stroke ([place])   . Thyroid disease     Patient Active Problem List   Diagnosis Date Noted  . Acute on chronic anemia 04/26/2017  . TIA (transient ischemic attack) 02/22/2017  . Right hip pain 02/09/2017  . Right shoulder pain 01/13/2017  . History of CVA with residual deficit 10/16/2016  . Weakness as late effect of cerebrovascular accident (CVA) 08/09/2016  . Depression with anxiety 08/04/2016  . Hemiplegia affecting right dominant side ([place]) 05/31/2016  . Hypokalemia 08/13/2014  . Diarrhea 08/13/2014  . Loss of weight 08/13/2014  . Hypocalcemia 08/13/2014  .  Atrophic vaginitis 08/13/2014  . Type 2 diabetes mellitus, uncontrolled ([place]) 08/13/2014  . Breast cancer ([place]) 08/13/2014  . Hypothyroidism 08/13/2014  . CAD (coronary artery disease) 08/13/2014    Past Surgical History:  Procedure Laterality Date  . ABDOMINAL HYSTERECTOMY     patient denies  . BLADDER SUSPENSION    . LIPOMA EXCISION      OB History    No data available       Home Medications    Prior to Admission medications   Medication Sig Start Date End Date Taking? Authorizing Provider  aspirin EC 81 MG tablet Take 81 mg by mouth daily.    Yes [provider]  Benzocaine (BOIL-EASE EX) Apply 1 application topically daily as needed (pain from boils).   Yes [provider]  clopidogrel (PLAVIX) 75 MG tablet Take 75 mg by mouth daily.   Yes [provider]  Colloidal Oatmeal (ECZEMA MOISTURIZING EX) Apply 1 application topically daily as needed (for irritation on buttocks).   Yes [provider]  docusate sodium (COLACE) 100 MG capsule Take 100 mg by mouth every morning.   Yes [provider]  ibuprofen (ADVIL,MOTRIN) 200 MG tablet Take 100 mg by mouth every 6 (six) hours as needed for headache (pain).   Yes [provider]  insulin glargine (LANTUS) 100 UNIT/ML injection Inject 20-30 Units into the skin See admin instructions. 20-30 units two times a day after  meals (lunch and dinner) 11/17/16  Yes [provider]  levothyroxine (SYNTHROID, LEVOTHROID) 137 MCG tablet Take 137 mcg by mouth daily before breakfast.   Yes [provider]  LORazepam (ATIVAN) 0.5 MG tablet Take 0.5 mg by mouth 4 (four) times daily.  12/07/16  Yes [provider]  OnabotulinumtoxinA (BOTOX IJ) every 3 (three) months.  03/19/17  Yes [provider]  ondansetron (ZOFRAN-ODT) 4 MG disintegrating tablet Take 4 mg by mouth every 6 (six) hours as needed for nausea or vomiting.  12/22/16  Yes [provider]    senna-docusate (DOK PLUS) 8.6-50 MG tablet Take 1 tablet by mouth daily as needed for mild constipation.   Yes [provider]  Spacer/Aero-Holding Chambers (AEROCHAMBER PLUS WITH MASK) inhaler Use as instructed 04/22/12  Yes Riki Altes, MD  ACCU-CHEK AVIVA PLUS test strip USE TO TEST BLOOD SUGAR UP TO QID 02/19/17   [provider]  ACCU-CHEK SOFTCLIX LANCETS lancets TEST UP TO QID 02/19/17   [provider]  atorvastatin (LIPITOR) 80 MG tablet Take 80 mg by mouth daily.    [provider]  benztropine (COGENTIN) 1 MG tablet Take 1 tablet (1 mg total) by mouth 2 (two) times daily. Patient not taking: Reported on 04/26/2017 12/31/16   Tanna Furry, MD  ferrous sulfate 220 (44 Fe) MG/5ML solution Take 220 mg by mouth daily.    [provider]  ferrous sulfate 325 (65 FE) MG tablet Take 1 tablet (325 mg total) by mouth 2 (two) times daily with a meal. Patient not taking: Reported on 04/26/2017 08/14/14   Thurnell Lose, MD  gabapentin (NEURONTIN) 100 MG capsule as needed. 50mg  ?    [provider]  Insulin Syringe-Needle U-100 31G X 1/4" 1 ML MISC 1 Dose by Misc.(Non-Drug; Combo Route) route daily. Or as directed to inject insulin under the skin 11/17/16   [provider]  polyethylene glycol (MIRALAX) packet Take 17 g by mouth daily. Patient not taking: Reported on 04/26/2017 09/25/16   Long, Wonda Olds, MD  traMADol (ULTRAM) 50 MG tablet Take 1 tablet (50 mg total) by mouth every 6 (six) hours as needed. Patient not taking: Reported on 04/26/2017 11/19/16   Veryl Speak, MD    Family History Family History  Problem Relation Age of Onset  . Diabetes Father   . Diabetes Sister   . Diabetes Brother     Social History Social History   Tobacco Use  . Smoking status: Current Every Day Smoker    Packs/day: 0.50    Types: Cigarettes  . Smokeless tobacco: Never Used  Substance Use Topics  . Alcohol use: No  . Drug use: No      Allergies   Amoxicillin; Morphine and related; Percocet [oxycodone-acetaminophen]; Percodan [oxycodone-aspirin]; Cymbalta [duloxetine hcl]; Gabapentin; Lexapro [escitalopram]; Penicillins; Percodan [oxycodone-aspirin]; Torecan [thiethylperazine]; and Sulfa antibiotics   Review of Systems Review of Systems  Constitutional: Positive for fatigue. Negative for fever.  Respiratory: Negative for shortness of breath.   Cardiovascular: Negative for chest pain.  Gastrointestinal: Negative for abdominal pain and blood in stool.  Genitourinary: Negative for hematuria.  Psychiatric/Behavioral: The patient is nervous/anxious.   All other systems reviewed and are negative.    Physical Exam Updated Vital Signs BP 135/79   Pulse (!) 116   Temp 99.3 F (37.4 C) (Oral)   Resp 19   Ht 4\' 11"  (1.499 m)   Wt 48.5 kg (107 lb)   SpO2 100%   BMI 21.61  kg/m   Physical Exam  Constitutional: She is oriented to person, place, and time. She appears well-developed and well-nourished. No distress.  Right sided facial droop (chronic), Right sided paralysis (Chronic)  HENT:  Head: Normocephalic and atraumatic.  Mouth/Throat: Oropharynx is clear and moist.  Eyes: Conjunctivae are normal.  Neck: Normal range of motion. Neck supple.  Cardiovascular: Regular rhythm, normal heart sounds and intact distal pulses.  tachycardic  Pulmonary/Chest: Effort normal and breath sounds normal. No stridor. No respiratory distress. She has no wheezes. She has no rales.  Abdominal: Soft. Bowel sounds are normal. She exhibits no distension. There is no tenderness. There is no rebound and no guarding.  Genitourinary:  Genitourinary Comments: Exam performed with female chaperone present. No gross blood on rectal exam  Neurological: She is alert and oriented to person, place, and time.  Skin: Skin is warm.  Psychiatric: She has a normal mood and affect. Her behavior is normal.  Nursing note and vitals  reviewed.    ED Treatments / Results  Labs (all labs ordered are listed, but only abnormal results are displayed) Labs Reviewed  BASIC METABOLIC PANEL - Abnormal; Notable for the following components:      Result Value   Potassium 3.4 (*)    Glucose, Bld 173 (*)    Creatinine, Ser 0.37 (*)    All other components within normal limits  CBC - Abnormal; Notable for the following components:   RBC 3.43 (*)    Hemoglobin 6.6 (*)    HCT 23.8 (*)    MCV 69.4 (*)    MCH 19.2 (*)    MCHC 27.7 (*)    RDW 16.9 (*)    All other components within normal limits  URINALYSIS, ROUTINE W REFLEX MICROSCOPIC - Abnormal; Notable for the following components:   Color, Urine STRAW (*)    All other components within normal limits  CBG MONITORING, ED - Abnormal; Notable for the following components:   Glucose-Capillary 133 (*)    All other components within normal limits  RETICULOCYTES  FERRITIN  IRON AND TIBC  POC OCCULT BLOOD, ED  TYPE AND SCREEN  PREPARE RBC (CROSSMATCH)    EKG  EKG Interpretation  Date/Time:  Thursday April 26 2017 14:42:43 EST Ventricular Rate:  118 PR Interval:  180 QRS Duration: 90 QT Interval:  322 QTC Calculation: 451 R Axis:   39 Text Interpretation:  Sinus tachycardia Minimal voltage criteria for LVH, may be normal variant Possible Anterior infarct , age undetermined Abnormal ECG No significant change since last tracing Confirmed by Alfonzo Beers [phone]) on 04/26/2017 3:11:03 PM Also confirmed by Alfonzo Beers [phone]), editor Philomena Doheny [phone])  on 04/26/2017 3:14:04 PM       Radiology No results found.  Procedures Procedures (including critical care time)  Medications Ordered in ED Medications  LORazepam (ATIVAN) injection 0.5 mg (not administered)  fentaNYL (SUBLIMAZE) injection 12.5 mcg (not administered)  0.9 %  sodium chloride infusion ( Intravenous New Bag/Given 04/26/17 1656)  ondansetron (ZOFRAN) injection 4 mg (4 mg Intravenous Given  04/26/17 1657)     Initial Impression / Assessment and Plan / ED Course  I have reviewed the triage vital signs and the nursing notes.  Pertinent labs & imaging results that were available during my care of the patient were reviewed by me and considered in my medical decision making (see chart for details).  Clinical Course as of Apr 26 1916  Thu Apr 26, 2017  1859 Dr. Philipp Ovens with Internal  Medicine accepting admission.  [JR]    Clinical Course User Index [JR] Ellianna Ruest, [place] N, PA-C    Presenting with fatigue and weakness times 3 days.  History of symptomatic anemia in April 2016, patient reports similar symptoms.  Patient is on Plavix for history of CVA.  No identifiable source of bleeding. Hemoccult neg. CBC done in triage revealing hemoglobin of 6.6, with hematocrit of 23.8.  UA negative. Will transfuse 2 units of red blood cells, and consult for admission.   Patient discussed with Dr. Marcha Dutton, who agrees with care plan.  The patient appears reasonably stabilized for admission considering the current resources, flow, and capabilities available in the ED at this time, and I doubt any other [facility] requiring further screening and/or treatment in the ED prior to admission.  Final Clinical Impressions(s) / ED Diagnoses   Final diagnoses:  Symptomatic anemia    ED Discharge Orders    None       Lilac Hoff, [place] N, PA-C 04/26/17 1845    Pinchas Reither, [place] N, PA-C 04/26/17 1919    Pixie Casino, MD 05/11/17 [phone]

## 2017-04-26 NOTE — H&P (Signed)
Date: 04/26/2017               Patient Name:  Laura Oconnor MRN: [id number]  DOB: 1955/05/31 Age / Sex: 62 y.o., female   PCP: Clovia Cuff, MD         Medical Service: Internal Medicine Teaching Service         Attending Physician: Dr. Annia Belt, MD    First Contact: Dr. Frederico Hamman Pager: [phone]  Second Contact: Dr. Hetty Ely Pager: [phone]       After Hours (After 5p/  First Contact Pager: [phone]  weekends / holidays): Second Contact Pager: [phone]   Chief Complaint: generalized weakness and fatigue  History of Present Illness:  Laura Oconnor is a 62yo female with PMH significant for CVA with residual right-sided weakness and wheelchair-bound (on aspirin and plavix), type 2 diabetes, anemia, CAD s/p stents, HTN, and hypothyroidism who presents with 3 days of generalized weakness and fatigue. Patient has a friend who assists with her care at bedside.  She states that she was in her usual state of health until 3 days ago when she started feeling weaker and more tired. She states "I was dehydrated and I needed blood". She denies feeling lightheaded or dizzy. She denies chest pain, palpitations, shortness of breath, abdominal pain, N/V, hematochezia, hematuria, or leg swelling.  She was admitted in April 2016 for hypokalemia and anemia. Iron studies at that time showed iron 10 (low), TIBC 373, ferritin 5 (low), folate 13, B12 259. She did not want to stay in the hospital for further work-up and was advised to follow-up with GI outpatient. She states she has not seen a gastroenterologist because "I don't need one", and she has never had a colonoscopy because she doesn't want one. She denies known family history of colon cancer. Her medication list includes ferrous sulfate tablet and solution, however she states she was told to stop taking these medications after her stroke.  She has chronic right sided weakness and a painful "frozen shoulder" on the right. She repeatedly asked for pain  medication for her shoulder. She states that at home she takes ibuprofen for the pain, which doesn't help. She states tylenol causes her to have an upset stomach.  Smokes ~4-5 cigarettes daily. Denies alcohol use. Lives at home with assistance of son and a friend.  ED Course: - BP 135/79, HR 118, RR 19, temp 99.3, O2 98% on RA - Hb 6.6, HCT 23.8, MCV 69.4. UA negative. FOBT negative. - EKG with sinus tachycardia. - Received 2u pRBC transfusion  Meds:  Current Meds  Medication Sig  . aspirin EC 81 MG tablet Take 81 mg by mouth daily.   Marland Kitchen atorvastatin (LIPITOR) 80 MG tablet Take 40 mg by mouth every morning.   . Benzocaine (BOIL-EASE EX) Apply 1 application topically daily as needed (pain from boils).  . clopidogrel (PLAVIX) 75 MG tablet Take 75 mg by mouth daily.  . Colloidal Oatmeal (ECZEMA MOISTURIZING EX) Apply 1 application topically daily as needed (for irritation on buttocks).  Marland Kitchen docusate sodium (COLACE) 100 MG capsule Take 100 mg by mouth every morning.  Marland Kitchen ibuprofen (ADVIL,MOTRIN) 200 MG tablet Take 100 mg by mouth every 6 (six) hours as needed for headache (pain).  . insulin glargine (LANTUS) 100 UNIT/ML injection Inject 20-30 Units into the skin See admin instructions. 20-30 units two times a day after meals (lunch and dinner)  . levothyroxine (SYNTHROID, LEVOTHROID) 137 MCG tablet Take 137 mcg by mouth  daily before breakfast.  . LORazepam (ATIVAN) 0.5 MG tablet Take 0.5 mg by mouth 4 (four) times daily.   . OnabotulinumtoxinA (BOTOX IJ) every 3 (three) months.   . ondansetron (ZOFRAN-ODT) 4 MG disintegrating tablet Take 4 mg by mouth every 6 (six) hours as needed for nausea or vomiting.   . senna-docusate (DOK PLUS) 8.6-50 MG tablet Take 1 tablet by mouth daily as needed for mild constipation.  Marland Kitchen Spacer/Aero-Holding Chambers (AEROCHAMBER PLUS WITH MASK) inhaler Use as instructed   Allergies: Allergies as of 04/26/2017 - Review Complete 04/09/2017  Allergen Reaction Noted  .  Amoxicillin Nausea And Vomiting 12/14/2010  . Morphine and related Nausea And Vomiting and Other (See Comments) 12/14/2010  . Percocet [oxycodone-acetaminophen] Nausea And Vomiting 11/13/2011  . Percodan [oxycodone-aspirin] Nausea And Vomiting 12/14/2010  . Cymbalta [duloxetine hcl] Other (See Comments) 12/31/2016  . Gabapentin Other (See Comments) 12/31/2016  . Lexapro [escitalopram] Other (See Comments) 04/26/2017  . Penicillins Nausea And Vomiting and Swelling 08/13/2014  . Percodan [oxycodone-aspirin] Nausea And Vomiting 04/26/2017  . Torecan [thiethylperazine] Other (See Comments) 08/13/2014  . Sulfa antibiotics Nausea And Vomiting and Rash 08/13/2014   Past Medical History:  Diagnosis Date  . Chronic arm pain   . Chronic pain syndrome   . Decubital ulcer   . Diabetes mellitus   . Heart attack ([place])   . History of right coronary artery stent placement    2 stents placed at separate times  . Hypertension   . Stroke ([place])   . Thyroid disease    Family History:  Family History  Problem Relation Age of Onset  . Diabetes Father   . Diabetes Sister   . Diabetes Brother    Social History:  - Smokes 4-5 cigarettes daily - Denies alcohol use - Lives at home alone with assistance of son and friend  Review of Systems: Constitutional: Negative for diaphoresis, fever, and weight loss. Positive for fatigue and generalized weakness HEENT: Negative for blurred vision, hearing loss, sinus pain, congestion, and sore throat. Respiratory: Negative for cough, shortness of breath, and wheezing. Cardiovascular: Negative for chest pain, palpitations, and leg swelling..  Gastrointestinal: Negative for abdominal pain, blood in stool, constipation, diarrhea, heartburn, nausea and vomiting. Genitourinary: Negative for dysuria and hematuria.  Musculoskeletal: Negative for joint pain and myalgias. Positive for chronic right shoulder pain secondary to adhesive capsulitis. Neurological: Negative  for lightheadedness, dizziness and headaches. Positive for chronic weakness in RUE and RLE, as well as right facial droop.  Physical Exam: Blood pressure 135/79, pulse (!) 116, temperature 99.3 F (37.4 C), temperature source Oral, resp. rate 19, height 4\' 11"  (1.499 m), weight 107 lb (48.5 kg), SpO2 100 %.  GEN: Frail-appearing female sitting up in bed. No acute distress. Alert and oriented. HENT: [place]/AT. Dry mucous membranes. No visible lesions. EYES: PERRL. Sclera non-icteric. Conjunctiva clear and no pallor. RESP: Clear to auscultation bilaterally. No wheezes, rales, or rhonchi. No increased work of breathing. CV: Normal rate and regular rhythm. No murmurs, gallops, or rubs. No LE edema. ABD: Soft. Non-tender. Non-distended. Normoactive bowel sounds. EXT: No edema. Warm and well perfused. Diffusely TTP on right shoulder. SKIN: Capillary refill <2sec. NEURO: Chronic right facial droop. Dysarthric speech. 0/5 strength in RUE and RLE 2/2 prior stroke. 5/5 strength in LUE and LLE. Normal sensation. Right side with mild spasticity.  Labs CBC Latest Ref Rng & Units 04/26/2017 01/29/2017 12/31/2016  WBC 4.0 - 10.5 K/uL 7.5 5.9 -  Hemoglobin 12.0 - 15.0 g/dL 6.6(LL) 9.6(L)  11.6(L)  Hematocrit 36.0 - 46.0 % 23.8(L) 30.1(L) 34.0(L)  Platelets 150 - 400 K/uL 358 360 -   CMP Latest Ref Rng & Units 04/26/2017 01/29/2017 12/31/2016  Glucose 65 - 99 mg/dL 173(H) 159(H) 116(H)  BUN 6 - 20 mg/dL 6 6 3(L)  Creatinine 0.44 - 1.00 mg/dL 0.37(L) 0.39(L) 0.30(L)  Sodium 135 - 145 mmol/L 138 136 143  Potassium 3.5 - 5.1 mmol/L 3.4(L) 3.4(L) 3.0(L)  Chloride 101 - 111 mmol/L 104 103 105  CO2 22 - 32 mmol/L 25 26 -  Calcium 8.9 - 10.3 mg/dL 9.1 9.2 -  Total Protein 6.5 - 8.1 g/dL - 7.4 -  Total Bilirubin 0.3 - 1.2 mg/dL - 0.5 -  Alkaline Phos 38 - 126 U/L - 125 -  AST 15 - 41 U/L - 20 -  ALT 14 - 54 U/L - 15 -   UA negative FOBT negative  EKG: personally reviewed my interpretation is sinus  tachycardia  Assessment & Plan by Problem: Active Problems:   Acute on chronic anemia  Laura Oconnor is a 62yo female with PMH significant for CVA with residual right-sided weakness and wheelchair-bound (on aspirin and plavix), type 2 diabetes, chronic anemia, CAD, HTN, and hypothyroidism who presents with 3 days of generalized weakness and fatigue, found to have Hb of 6.6.  Acute on chronic anemia She has chronic anemia of uncertain etiology. Her medication list includes ferrous sulfate tablet and solution, however she states she was told to stop taking these medications after her stroke. She was last admitted in April 2016 for acute anemia, which was suspected to be anemia of chronic disease. Denies hematochezia or hematuria. FOBT negative. Receiving 2u pRBC transfusion for Hb 6.6 (baseline ~9.2-11). Possibly volume down on exam, will give her gentle fluids overnight. Drawing iron studies after transfusion already started, so results may be unreliable.  - Receiving 2u pRBC transfusion - Iron, TIBC, ferritin - Retic count - IV NS 75cc/hr x10hr - CBC and BMP in AM  Right shoulder pain, presumed adhesive capsulitis vs altered pain perception secondary to CVA Seen by Sports Medicine outpatient. Intra-articular injections with bupivicaine and depomedrol were not helpful. She has been seen multiple times in the ED for right shoulder pain and has received fentanyl for the pain due to reported allergies to other pain medications. During my interview with her, she persistently requested something for the pain and asked several times what I was going to give her. She states ibuprofen does not help and tylenol upsets her stomach. Her medication list includes tramadol, however in the  database, it does not appear that she has received any long-term prescriptions for opioid medications. Concern for pain-seeking behavior. 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 Reports compliance with these medications. - Continue aspirin and plavix due to no evidence of active GI bleed  T2DM Home regimen includes lantus 20-30u BID after lunch and dinner. - CBG monitoring - Lantus 10u BID - SSI-moderate  Hypothyroidism - Continue home levothyroxine 179mcg daily  Diet: HH/Carb modified, speech/swallow eval VTE PPx: SCDs Code Status: Partial code (CPR is OK. DNI) Dispo: Admit patient to Observation with expected length of stay less than 2 midnights.  Signed: Colbert Ewing, MD 04/26/2017, [place] Cater  PM  Pager: P [phone]

## 2017-04-26 NOTE — ED Notes (Signed)
Pt given applesauce and peanut butter crackers

## 2017-04-26 NOTE — ED Notes (Signed)
Pt attempted to urinate in female urinal (as opposed to bedpan due to sore) for 15+minutes and was unsuccessful. Pt informed to let tech/RN know when she needs to try again.

## 2017-04-26 NOTE — ED Notes (Signed)
Attempted report x1. 

## 2017-04-26 NOTE — ED Triage Notes (Addendum)
Per EMS, pt from home with complaint of weakness x 3 days. Pt has hx of anemia blood transfusions and states that this is how she feels when she becomes anemic. Pt has hx of cva from February with right side deficits, slurred speech and right side facial droop. Pt is axox4.

## 2017-04-27 LAB — BPAM RBC
Blood Product Expiration Date: 201901172359
Blood Product Expiration Date: 201901172359
ISSUE DATE / TIME: 201901101642
ISSUE DATE / TIME: 201901102220
UNIT TYPE AND RH: 5100
Unit Type and Rh: 5100

## 2017-04-27 LAB — TYPE AND SCREEN
ABO/RH(D): O POS
Antibody Screen: NEGATIVE
UNIT DIVISION: 0
Unit division: 0

## 2017-04-27 MED ORDER — ONDANSETRON 4 MG PO TBDP
4.0000 mg | ORAL_TABLET | Freq: Once | ORAL | Status: AC
  Administered 2017-04-27: 4 mg via ORAL
  Filled 2017-04-27: qty 1

## 2017-04-27 NOTE — Discharge Summary (Signed)
Patient Left AMA  Name: Laura Oconnor MRN: [id number] DOB: [birth year] 62 y.o. PCP: Clovia Cuff, MD  Date of Admission: 04/26/2017  2:25 PM Date of Discharge:  Attending Physician: Annia Belt, MD  Discharge Diagnosis: 1. Acute on Chronic Anemia  Discharge Medications:   Disposition and follow-up:   Laura Oconnor was discharged from [facility] in [place] condition.  At the hospital follow up visit please address:  1.  Acute on Chronic Anemia: Patient found to have hemoglobin 6.6 from her baseline of 9-10. She was transfused 2 units of blood in the ED. Post transfusion CBC was not collected prior to patient leaving AMA.  2. Chronic Shoulder Pain: Patient exhibited multiple red flags for narcotic seeking behavior, eventually signed out AMA due to not being given pain medication.  3.  Labs / imaging needed at time of follow-up: CBC, iron studies, patient continues to refuse colonoscopy or other imaging tests  4.  Pending labs/ test needing follow-up: None  Follow-up Appointments:   Hospital Course by problem list:  1. Acute on Chronic Anemia: Patient presented with generalized weakness and fatigue found to have acute on chronic anemia with hemoglobin 6.6 from her baseline of 9-10. She was transfused 2 units of blood in the ED. FOBT was negative. UA was negative for hematuria. She denied any overt bleeding. Iron studies were ordered but not collected. 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 She also takes ibuprofen regularly for adhesive capsulitis. Patient had a similar admission 2016 for the same but refused endoscopy and insisted on discharge prior to completion of work up. Etiology at that time was felt to be anemia of chronic disease. She is prescribed iron supplements but reports non compliance. Post transfusion CBC was not collected prior to patient leaving AMA. She was advised that  further work-up of her acute anemia was warranted, however she refused further tests/imaging and continued to insist that she wanted to go home. Patient was counseled on the risks of leaving the hospital prior to completion of work up and treatment including on going bleeding and death. She expressed understanding and signed AMA paperwork.  2. Chronic Shoulder Pain: Due to adhesive capsulitis vs neuropathic pain after stroke. Reportedly follows with sports medicine, neurology, and pain management clinic. She states she normally takes ibuprofen at home but it doesn't help. She reports that she has also previously tried tylenol, gabapentin, lyrica, duloxetine, tramadol, cortisone injections, lidocaine injections, and botox injections with minimal relief of her pain or increased side effects. Received fentanyl in the ED. Per chart review, she has had multiple ER visits for this and reportedly can only take Fentanyl due to allergies to other medications. Patient exhibited multiple red flags for narcotic seeking behavior, eventually signed out AMA due to not being given pain medication.  Discharge Vitals:   BP 140/61   Pulse (!) 114   Temp 98.7 F (37.1 C) (Oral)   Resp 18   Ht 4\' 11"  (1.499 m)   Wt 107 lb (48.5 kg)   SpO2 100%   BMI 21.61 kg/m   Pertinent Labs, Studies, and Procedures:  CBC Latest Ref Rng & Units 04/26/2017 01/29/2017 12/31/2016  WBC 4.0 - 10.5 K/uL 7.5 5.9 -  Hemoglobin 12.0 - 15.0 g/dL 6.6(LL) 9.6(L) 11.6(L)  Hematocrit 36.0 - 46.0 % 23.8(L) 30.1(L) 34.0(L)  Platelets 150 - 400 K/uL 358 360 -   Discharge Instructions:  Signed:  Colbert Ewing, MD 04/27/2017, 3:26 AM   Pager: [phone]

## 2017-04-27 NOTE — Progress Notes (Signed)
IMTS INTERVAL PROGRESS NOTE  Paged by RN at ~2am that patient was requesting to speak to a doctor regarding her pain medications and that she wanted to leave. I went to bedside to evaluate patient. Upon entering the room, she was sitting up in the chair in NAD. When asked what I could do for her, she stated that she needed pain medication. I explained to her that her pain is likely neuropathic pain from her CVA rather than MSK/orthopedic pain, as per prior notes in her chart. To treat her pain, I offered multiple options, including tramadol, tylenol, gabapentin, lyrica, and cymbalta. She stated that she had tried all of these medications before and that none of them were helpful and that they caused side effects. I explained that I did not feel comfortable prescribing opioid medications for her chronic pain (Per review of the database, she has not filled any prescriptions for opiates in the last year). I asked her what she normally takes at home for her pain or what she has tried that is helpful for her pain, and she was unable to tell me. She was only able to express to me that nothing she had tried was helpful. She reports that she is being followed by her primary care physician, neurologist, and pain clinic for her chronic pain, and they have tried cortisone injections, lidocaine injections, and botox injections with minimal relief. She asked for a "shot that will go straight to my pain to take it away", which I stated that I am not able to do. I explained to her that the health care providers at the pain clinic are the specialists and are the best option for management of her chronic pain.  She then stated that she wanted to go home. I offered a trial of voltaren gel or capsaicin cream as an alternative for her pain. She asked for a prescription for these medications to take home. I advised that she needed to stay in the hospital for further work-up of her acute anemia. She stated that "my blood counts were  low because I ate cornstarch and I wasn't supposed to" and "I don't want any of those machine tests". I explained to her that having further bleeding and low blood counts could be dangerous and possibly could result in death and advised that it would be best for [place] to monitor her for at least another day until our attending could see her in the morning. She declined and stated that she wanted to go home. She stated "they don't know how to take care of stroke patients here". I asked her how or what we could do to make her more comfortable, and she was unable to tell me.  She then told me that the liquid in her cup that we were giving her was not potassium and that it was something else. She asked me to taste the medication, which I stated I was not comfortable doing. I confirmed with the nurse that the liquid in her cup was potassium (which was mixed with orange juice per pt's request). The nurse informed me that she and the patient's caretaker at bedside had tasted the liquid at the patient's request and that it was "sweet and bitter". The patient stated the liquid was "pure salt" and continued to insist it was not potassium. I asked if she would like the potassium tablet instead and she said she would take it at home. I again advised her of the risks of leaving AMA, including continued  bleeding and potentially death. Patient expressed understanding and signed [place] paperwork.  Colbert Ewing, MD Internal Medicine, PGY-1

## 2017-04-27 NOTE — Progress Notes (Signed)
Pt was upset for not getting pain medication.She was given Fentanyl at ED .She refuse TraMadol and Voltaren gel for her [place]  Shoulder.She finally took the Tramadol .As soon as she finished the blood transfusion she wanted to leave.MD was called in person to explain what alternatives we can offer her she refused and left AMA.

## 2017-04-27 NOTE — Progress Notes (Signed)
Pt is refusing cardiac monitor by stating that" it is giving her panic attack.MD notified and discontinue tele monitor.

## 2017-05-23 ENCOUNTER — Other Ambulatory Visit: Payer: Self-pay

## 2017-05-23 ENCOUNTER — Encounter (HOSPITAL_BASED_OUTPATIENT_CLINIC_OR_DEPARTMENT_OTHER): Payer: Self-pay | Admitting: *Deleted

## 2017-05-23 DIAGNOSIS — E119 Type 2 diabetes mellitus without complications: Secondary | ICD-10-CM | POA: Diagnosis not present

## 2017-05-23 DIAGNOSIS — I2510 Atherosclerotic heart disease of native coronary artery without angina pectoris: Secondary | ICD-10-CM | POA: Diagnosis not present

## 2017-05-23 DIAGNOSIS — Z794 Long term (current) use of insulin: Secondary | ICD-10-CM | POA: Diagnosis not present

## 2017-05-23 DIAGNOSIS — Z955 Presence of coronary angioplasty implant and graft: Secondary | ICD-10-CM | POA: Insufficient documentation

## 2017-05-23 DIAGNOSIS — N3289 Other specified disorders of bladder: Secondary | ICD-10-CM | POA: Insufficient documentation

## 2017-05-23 DIAGNOSIS — Z79899 Other long term (current) drug therapy: Secondary | ICD-10-CM | POA: Diagnosis not present

## 2017-05-23 DIAGNOSIS — Z7982 Long term (current) use of aspirin: Secondary | ICD-10-CM | POA: Diagnosis not present

## 2017-05-23 DIAGNOSIS — F17210 Nicotine dependence, cigarettes, uncomplicated: Secondary | ICD-10-CM | POA: Insufficient documentation

## 2017-05-23 DIAGNOSIS — R350 Frequency of micturition: Secondary | ICD-10-CM | POA: Diagnosis present

## 2017-05-23 DIAGNOSIS — I10 Essential (primary) hypertension: Secondary | ICD-10-CM | POA: Diagnosis not present

## 2017-05-23 DIAGNOSIS — E039 Hypothyroidism, unspecified: Secondary | ICD-10-CM | POA: Insufficient documentation

## 2017-05-23 LAB — URINALYSIS, ROUTINE W REFLEX MICROSCOPIC
Bilirubin Urine: NEGATIVE
GLUCOSE, UA: 100 mg/dL — AB
Hgb urine dipstick: NEGATIVE
KETONES UR: NEGATIVE mg/dL
LEUKOCYTES UA: NEGATIVE
NITRITE: NEGATIVE
PROTEIN: NEGATIVE mg/dL
Specific Gravity, Urine: 1.01 (ref 1.005–1.030)
pH: 6.5 (ref 5.0–8.0)

## 2017-05-23 NOTE — ED Triage Notes (Signed)
Pt c/o urine frequency and incontinence x 3 days

## 2017-05-24 ENCOUNTER — Emergency Department (HOSPITAL_BASED_OUTPATIENT_CLINIC_OR_DEPARTMENT_OTHER)
Admission: EM | Admit: 2017-05-24 | Discharge: 2017-05-24 | Disposition: A | Discharge status: Home / Self Care | Payer: Medicaid Other | Attending: Emergency Medicine | Admitting: Emergency Medicine

## 2017-05-24 ENCOUNTER — Encounter (HOSPITAL_BASED_OUTPATIENT_CLINIC_OR_DEPARTMENT_OTHER): Payer: Self-pay | Admitting: Emergency Medicine

## 2017-05-24 DIAGNOSIS — N3289 Other specified disorders of bladder: Secondary | ICD-10-CM

## 2017-05-24 MED ORDER — PHENAZOPYRIDINE HCL 200 MG PO TABS: 200.0000 mg | ORAL_TABLET | Freq: Three times a day (TID) | ORAL | 0 refills | Status: DC | PRN

## 2017-05-24 MED ORDER — PHENAZOPYRIDINE HCL 100 MG PO TABS
200.0000 mg | ORAL_TABLET | Freq: Once | ORAL | Status: AC
  Administered 2017-05-24: 200 mg via ORAL
  Filled 2017-05-24: qty 2

## 2017-05-24 NOTE — ED Provider Notes (Signed)
[place] EMERGENCY DEPARTMENT Provider Note   CSN: [id number] Arrival date & time: 05/23/17  2337     History   Chief Complaint Chief Complaint  Patient presents with  . Urinary Frequency    HPI Laura Oconnor is a 62 y.o. female.  The history is provided by the patient.  Urinary Frequency  This is a recurrent problem. The current episode started more than 2 days ago. The problem occurs constantly. The problem has not changed since onset.Pertinent negatives include no chest pain, no abdominal pain, no headaches and no shortness of breath. Nothing aggravates the symptoms. Nothing relieves the symptoms. She has tried nothing for the symptoms. The treatment provided no relief.  States she wants to make sure her bladder has not fall out and would like a script for the "medication that makes my pee orange."    Past Medical History:  Diagnosis Date  . Chronic arm pain   . Chronic pain syndrome   . Decubital ulcer   . Diabetes mellitus   . Heart attack ([place])   . History of right coronary artery stent placement    2 stents placed at separate times  . Hypertension   . Stroke ([place])   . Thyroid disease     Patient Active Problem List   Diagnosis Date Noted  . Acute on chronic anemia 04/26/2017  . TIA (transient ischemic attack) 02/22/2017  . Right hip pain 02/09/2017  . Right shoulder pain 01/13/2017  . History of CVA with residual deficit 10/16/2016  . Weakness as late effect of cerebrovascular accident (CVA) 08/09/2016  . Depression with anxiety 08/04/2016  . Hemiplegia affecting right dominant side ([place]) 05/31/2016  . Hypokalemia 08/13/2014  . Diarrhea 08/13/2014  . Loss of weight 08/13/2014  . Hypocalcemia 08/13/2014  . Atrophic vaginitis 08/13/2014  . Type 2 diabetes mellitus, uncontrolled ([place]) 08/13/2014  . Breast cancer ([place]) 08/13/2014  . Hypothyroidism 08/13/2014  . CAD (coronary artery disease) 08/13/2014    Past Surgical History:  Procedure  Laterality Date  . ABDOMINAL HYSTERECTOMY     patient denies  . BLADDER SUSPENSION    . LIPOMA EXCISION      OB History    No data available       Home Medications    Prior to Admission medications   Medication Sig Start Date End Date Taking? Authorizing Provider  ACCU-CHEK AVIVA PLUS test strip USE TO TEST BLOOD SUGAR UP TO QID 02/19/17   [provider]  ACCU-CHEK SOFTCLIX LANCETS lancets TEST UP TO QID 02/19/17   [provider]  aspirin EC 81 MG tablet Take 81 mg by mouth daily.     [provider]  atorvastatin (LIPITOR) 80 MG tablet Take 40 mg by mouth every morning.     [provider]  Benzocaine (BOIL-EASE EX) Apply 1 application topically daily as needed (pain from boils).    [provider]  benztropine (COGENTIN) 1 MG tablet Take 1 tablet (1 mg total) by mouth 2 (two) times daily. Patient not taking: Reported on 04/26/2017 12/31/16   Tanna Furry, MD  clopidogrel (PLAVIX) 75 MG tablet Take 75 mg by mouth daily.    [provider]  Colloidal Oatmeal (ECZEMA MOISTURIZING EX) Apply 1 application topically daily as needed (for irritation on buttocks).    [provider]  docusate sodium (COLACE) 100 MG capsule Take 100 mg by mouth every morning.    [provider]  ferrous sulfate 220 (44 Fe) MG/5ML  solution Take 220 mg by mouth daily.    [provider]  ferrous sulfate 325 (65 FE) MG tablet Take 1 tablet (325 mg total) by mouth 2 (two) times daily with a meal. Patient not taking: Reported on 04/26/2017 08/14/14   Thurnell Lose, MD  gabapentin (NEURONTIN) 100 MG capsule as needed. 50mg  ?    [provider]  ibuprofen (ADVIL,MOTRIN) 200 MG tablet Take 100 mg by mouth every 6 (six) hours as needed for headache (pain).    [provider]  insulin glargine (LANTUS) 100 UNIT/ML injection Inject 20-30 Units into the skin See admin instructions. 20-30 units two times a day after meals  (lunch and dinner) 11/17/16   [provider]  Insulin Syringe-Needle U-100 31G X 1/4" 1 ML MISC 1 Dose by Misc.(Non-Drug; Combo Route) route daily. Or as directed to inject insulin under the skin 11/17/16   [provider]  levothyroxine (SYNTHROID, LEVOTHROID) 137 MCG tablet Take 137 mcg by mouth daily before breakfast.    [provider]  LORazepam (ATIVAN) 0.5 MG tablet Take 0.5 mg by mouth 4 (four) times daily as needed for anxiety.  12/07/16   [provider]  OnabotulinumtoxinA (BOTOX IJ) every 3 (three) months. RIGHT ARM 03/19/17   [provider]  ondansetron (ZOFRAN-ODT) 4 MG disintegrating tablet Take 4 mg by mouth every 6 (six) hours as needed for nausea or vomiting.  12/22/16   [provider]  phenazopyridine (PYRIDIUM) 200 MG tablet Take 1 tablet (200 mg total) by mouth 3 (three) times daily as needed for pain. 05/24/17   Shaquan Puerta, MD  polyethylene glycol [facility]) packet Take 17 g by mouth daily. Patient not taking: Reported on 04/26/2017 09/25/16   Long, Wonda Old[employer], MD  senna-docusate (DOK PLUS) 8.6-50 MG tablet Take 1 tablet by mouth daily as needed for mild constipation.    [provider]  Spacer/Aero-Holding Chambers (AEROCHAMBER PLUS WITH MASK) inhaler Use as instructed 04/22/12   Riki Altes, MD  traMADol (ULTRAM) 50 MG tablet Take 1 tablet (50 mg total) by mouth every 6 (six) hours as needed. Patient not taking: Reported on 04/26/2017 11/19/16   Veryl Speak, MD    Family History Family History  Problem Relation Age of Onset  . Diabetes Father   . Diabetes Sister   . Diabetes Brother     Social History Social History   Tobacco Use  . Smoking status: Current Every Day Smoker    Packs/day: 0.50    Types: Cigarettes  . Smokeless tobacco: Never Used  Substance Use Topics  . Alcohol use: No  . Drug use: No     Allergies   Amoxicillin; Morphine and related; Percocet [oxycodone-acetaminophen]; Percodan  [oxycodone-aspirin]; Cymbalta [duloxetine hcl]; Gabapentin; Lexapro [escitalopram]; Penicillins; Percodan [oxycodone-aspirin]; Torecan [thiethylperazine]; and Sulfa antibiotics   Review of Systems Review of Systems  Constitutional: Negative for fever.  Respiratory: Negative for shortness of breath.   Cardiovascular: Negative for chest pain.  Gastrointestinal: Negative for abdominal pain.  Genitourinary: Positive for frequency. Negative for dysuria, flank pain, hematuria, vaginal bleeding, vaginal discharge and vaginal pain.  Neurological: Negative for headaches.  All other systems reviewed and are negative.    Physical Exam Updated Vital Signs BP (!) 147/44   Pulse (!) 109   Temp 98.1 F (36.7 C) (Oral)   Resp 16   Ht 4\' 11"  (1.499 m)   Wt 49 kg (108 lb)   SpO2 100%   BMI 21.81 kg/m   Physical  Exam  Constitutional: She is oriented to person, place, and time. She appears well-developed and well-nourished. No distress.  HENT:  Head: Normocephalic and atraumatic.  Mouth/Throat: No oropharyngeal exudate.  Eyes: Conjunctivae are normal. Pupils are equal, round, and reactive to light.  Neck: Normal range of motion. Neck supple.  Cardiovascular: Normal rate, regular rhythm, normal heart sounds and intact distal pulses.  Pulmonary/Chest: Effort normal and breath sounds normal. No stridor. She has no wheezes. She has no rales.  Abdominal: Soft. Bowel sounds are normal. She exhibits no mass. There is no tenderness. There is no rebound and no guarding.  Genitourinary:  Genitourinary Comments: Chaperone present.  External and bimanual only patient bearing down, no prolapsed uterus or bladder  Musculoskeletal: Normal range of motion.  Neurological: She is alert and oriented to person, place, and time. She displays normal reflexes.  Skin: Skin is warm and dry. Capillary refill takes less than 2 seconds.  Psychiatric: She has a normal mood and affect.     ED Treatments / Results    Labs (all labs ordered are listed, but only abnormal results are displayed) Results for orders placed or performed during the hospital encounter of 05/24/17  Urinalysis, Routine w reflex microscopic  Result Value Ref Range   Color, Urine YELLOW YELLOW   APPearance CLEAR CLEAR   Specific Gravity, Urine 1.010 1.005 - 1.030   pH 6.5 5.0 - 8.0   Glucose, UA 100 (A) NEGATIVE mg/dL   Hgb urine dipstick NEGATIVE NEGATIVE   Bilirubin Urine NEGATIVE NEGATIVE   Ketones, ur NEGATIVE NEGATIVE mg/dL   Protein, ur NEGATIVE NEGATIVE mg/dL   Nitrite NEGATIVE NEGATIVE   Leukocytes, UA NEGATIVE NEGATIVE   No results found.  Radiology No results found.  Procedures Procedures (including critical care time)  Medications Ordered in ED Medications  phenazopyridine (PYRIDIUM) tablet 200 mg (200 mg Oral Given 05/24/17 0150)      Final Clinical Impressions(s) / ED Diagnoses   Final diagnoses:  Bladder spasm   Return for weakness, numbness, changes in vision or speech,  fevers > 100.4 unrelieved by medication, shortness of breath, intractable vomiting, or diarrhea, abdominal pain, Inability to tolerate liquids or food, cough, altered mental status or any concerns. No signs of systemic illness or infection. The patient is nontoxic-appearing on exam and vital signs are within normal limits.    I have reviewed the triage vital signs and the nursing notes. Pertinent labs &imaging results that were available during my care of the patient were reviewed by me and considered in my medical decision making (see chart for details).  After history, exam, and medical workup I feel the patient has been appropriately medically screened and is safe for discharge home. Pertinent diagnoses were discussed with the patient. Patient was given return precautions. ED Discharge Orders        Ordered    phenazopyridine (PYRIDIUM) 200 MG tablet  3 times daily PRN     05/24/17 0142       Tawan Corkern, MD 05/24/17  [id number]

## 2017-07-26 ENCOUNTER — Emergency Department (HOSPITAL_BASED_OUTPATIENT_CLINIC_OR_DEPARTMENT_OTHER)
Admission: EM | Admit: 2017-07-26 | Discharge: 2017-07-26 | Disposition: A | Discharge status: Home / Self Care | Payer: Medicaid Other | Attending: Emergency Medicine | Admitting: Emergency Medicine

## 2017-07-26 ENCOUNTER — Encounter (HOSPITAL_BASED_OUTPATIENT_CLINIC_OR_DEPARTMENT_OTHER): Payer: Self-pay

## 2017-07-26 ENCOUNTER — Other Ambulatory Visit: Payer: Self-pay

## 2017-07-26 DIAGNOSIS — Z853 Personal history of malignant neoplasm of breast: Secondary | ICD-10-CM | POA: Insufficient documentation

## 2017-07-26 DIAGNOSIS — M79641 Pain in right hand: Secondary | ICD-10-CM | POA: Diagnosis not present

## 2017-07-26 DIAGNOSIS — I10 Essential (primary) hypertension: Secondary | ICD-10-CM | POA: Diagnosis not present

## 2017-07-26 DIAGNOSIS — Z794 Long term (current) use of insulin: Secondary | ICD-10-CM | POA: Diagnosis not present

## 2017-07-26 DIAGNOSIS — E119 Type 2 diabetes mellitus without complications: Secondary | ICD-10-CM | POA: Diagnosis not present

## 2017-07-26 DIAGNOSIS — G8929 Other chronic pain: Secondary | ICD-10-CM | POA: Diagnosis not present

## 2017-07-26 DIAGNOSIS — E039 Hypothyroidism, unspecified: Secondary | ICD-10-CM | POA: Diagnosis not present

## 2017-07-26 DIAGNOSIS — I2510 Atherosclerotic heart disease of native coronary artery without angina pectoris: Secondary | ICD-10-CM | POA: Diagnosis not present

## 2017-07-26 DIAGNOSIS — K591 Functional diarrhea: Secondary | ICD-10-CM

## 2017-07-26 DIAGNOSIS — R197 Diarrhea, unspecified: Secondary | ICD-10-CM | POA: Diagnosis present

## 2017-07-26 DIAGNOSIS — R531 Weakness: Secondary | ICD-10-CM | POA: Diagnosis not present

## 2017-07-26 DIAGNOSIS — F17210 Nicotine dependence, cigarettes, uncomplicated: Secondary | ICD-10-CM | POA: Insufficient documentation

## 2017-07-26 DIAGNOSIS — Z7902 Long term (current) use of antithrombotics/antiplatelets: Secondary | ICD-10-CM | POA: Diagnosis not present

## 2017-07-26 DIAGNOSIS — Z7982 Long term (current) use of aspirin: Secondary | ICD-10-CM | POA: Diagnosis not present

## 2017-07-26 LAB — COMPREHENSIVE METABOLIC PANEL
ALBUMIN: 3.5 g/dL (ref 3.5–5.0)
ALK PHOS: 121 U/L (ref 38–126)
ALT: 7 U/L — ABNORMAL LOW (ref 14–54)
AST: 11 U/L — AB (ref 15–41)
Anion gap: 8 (ref 5–15)
BILIRUBIN TOTAL: 0.1 mg/dL — AB (ref 0.3–1.2)
BUN: 6 mg/dL (ref 6–20)
CALCIUM: 9.2 mg/dL (ref 8.9–10.3)
CO2: 24 mmol/L (ref 22–32)
CREATININE: 0.38 mg/dL — AB (ref 0.44–1.00)
Chloride: 106 mmol/L (ref 101–111)
GFR calc Af Amer: >60 mL/min (ref >60)
GFR calc non Af Amer: >60 mL/min (ref >60)
GLUCOSE: 175 mg/dL — AB (ref 65–99)
Potassium: 3.2 mmol/L — ABNORMAL LOW (ref 3.5–5.1)
SODIUM: 138 mmol/L (ref 135–145)
Total Protein: 7.4 g/dL (ref 6.5–8.1)

## 2017-07-26 LAB — CBC
HCT: 30.6 % — ABNORMAL LOW (ref 36.0–46.0)
HEMOGLOBIN: 9.5 g/dL — AB (ref 12.0–15.0)
MCH: 22.9 pg — ABNORMAL LOW (ref 26.0–34.0)
MCHC: 31 g/dL (ref 30.0–36.0)
MCV: 73.9 fL — ABNORMAL LOW (ref 78.0–100.0)
Platelets: 366 10*3/uL (ref 150–400)
RBC: 4.14 MIL/uL (ref 3.87–5.11)
RDW: 17.8 % — ABNORMAL HIGH (ref 11.5–15.5)
WBC: 9.7 10*3/uL (ref 4.0–10.5)

## 2017-07-26 LAB — LIPASE, BLOOD: Lipase: 24 U/L (ref 11–51)

## 2017-07-26 LAB — URINALYSIS, ROUTINE W REFLEX MICROSCOPIC
Bilirubin Urine: NEGATIVE
Glucose, UA: 100 mg/dL — AB
Hgb urine dipstick: NEGATIVE
KETONES UR: NEGATIVE mg/dL
LEUKOCYTES UA: NEGATIVE
Nitrite: NEGATIVE
PROTEIN: NEGATIVE mg/dL
Specific Gravity, Urine: 1.02 (ref 1.005–1.030)
pH: 6.5 (ref 5.0–8.0)

## 2017-07-26 MED ORDER — SODIUM CHLORIDE 0.9 % IV BOLUS
500.0000 mL | Freq: Once | INTRAVENOUS | Status: AC
  Administered 2017-07-26: 500 mL via INTRAVENOUS

## 2017-07-26 NOTE — ED Provider Notes (Signed)
[facility] EMERGENCY DEPARTMENT Provider Note   CSN: [id number] Arrival date & time: 07/26/17  1617     History   Chief Complaint Chief Complaint  Patient presents with  . Diarrhea    HPI Laura Oconnor is a 62 y.o. female with a h/o of uncontrolled DM Type II, CVA with right-sided hemiplegia, CAD, and hypothyroidism who presents to the emergency department with a chief complaint of generalized weakness and diarrhea.  The patient reports her last BM was 9 days ago. She took a saline enema at around 9 AM and has had "countless" episodes of nonbloody diarrhea with gradually worsening generalized weakness.  No diarrhea since arrival in the ED.  Denies nausea, vomiting, fever, chills, abdominal pain, dysuria, frequency, hematochezia, melena, vaginal pain or discharge.    No treatment prior to arrival.  The history is provided by the patient. No language interpreter was used.  Diarrhea   Pertinent negatives include no abdominal pain, no vomiting, no myalgias and no cough.    Past Medical History:  Diagnosis Date  . Chronic arm pain   . Chronic pain syndrome   . Decubital ulcer   . Diabetes mellitus   . Heart attack ([place])   . History of right coronary artery stent placement    2 stents placed at separate times  . Hypertension   . Stroke ([place])   . Thyroid disease     Patient Active Problem List   Diagnosis Date Noted  . Acute on chronic anemia 04/26/2017  . TIA (transient ischemic attack) 02/22/2017  . Right hip pain 02/09/2017  . Right shoulder pain 01/13/2017  . History of CVA with residual deficit 10/16/2016  . Weakness as late effect of cerebrovascular accident (CVA) 08/09/2016  . Depression with anxiety 08/04/2016  . Hemiplegia affecting right dominant side ([place]) 05/31/2016  . Hypokalemia 08/13/2014  . Diarrhea 08/13/2014  . Loss of weight 08/13/2014  . Hypocalcemia 08/13/2014  . Atrophic vaginitis 08/13/2014  . Type 2 diabetes mellitus, uncontrolled  ([place]) 08/13/2014  . Breast cancer ([place]) 08/13/2014  . Hypothyroidism 08/13/2014  . CAD (coronary artery disease) 08/13/2014    Past Surgical History:  Procedure Laterality Date  . BLADDER SUSPENSION    . LIPOMA EXCISION       OB History   None      Home Medications    Prior to Admission medications   Medication Sig Start Date End Date Taking? Authorizing Provider  ACCU-CHEK AVIVA PLUS test strip USE TO TEST BLOOD SUGAR UP TO QID 02/19/17  Yes [provider]  ACCU-CHEK SOFTCLIX LANCETS lancets TEST UP TO QID 02/19/17  Yes [provider]  aspirin EC 81 MG tablet Take 81 mg by mouth daily.    Yes [provider]  atorvastatin (LIPITOR) 80 MG tablet Take 40 mg by mouth every morning.    Yes [provider]  clopidogrel (PLAVIX) 75 MG tablet Take 75 mg by mouth daily.   Yes [provider]  Colloidal Oatmeal (ECZEMA MOISTURIZING EX) Apply 1 application topically daily as needed (for irritation on buttocks).   Yes [provider]  docusate sodium (COLACE) 100 MG capsule Take 100 mg by mouth every morning.   Yes [provider]  ferrous sulfate 220 (44 Fe) MG/5ML solution Take 220 mg by mouth daily.   Yes [provider]  ferrous sulfate 325 (65 FE) MG tablet Take 1 tablet (325 mg total) by mouth 2 (two) times daily with a meal. 08/14/14  Yes Thurnell Lose, MD  gabapentin (NEURONTIN) 100 MG capsule as needed. 50mg  ?   Yes [provider]  ibuprofen (ADVIL,MOTRIN) 200 MG tablet Take 100 mg by mouth every 6 (six) hours as needed for headache (pain).   Yes [provider]  insulin glargine (LANTUS) 100 UNIT/ML injection Inject 20-30 Units into the skin See admin instructions. 20-30 units two times a day after meals (lunch and dinner) 11/17/16  Yes [provider]  Insulin Syringe-Needle U-100 31G X 1/4" 1 ML MISC 1 Dose by Misc.(Non-Drug; Combo Route) route daily. Or as directed to inject  insulin under the skin 11/17/16  Yes [provider]  levothyroxine (SYNTHROID, LEVOTHROID) 137 MCG tablet Take 137 mcg by mouth daily before breakfast.   Yes [provider]  LORazepam (ATIVAN) 0.5 MG tablet Take 0.5 mg by mouth 4 (four) times daily as needed for anxiety.  12/07/16  Yes [provider]  OnabotulinumtoxinA (BOTOX IJ) every 3 (three) months. RIGHT ARM 03/19/17  Yes [provider]  ondansetron (ZOFRAN-ODT) 4 MG disintegrating tablet Take 4 mg by mouth every 6 (six) hours as needed for nausea or vomiting.  12/22/16  Yes [provider]  phenazopyridine (PYRIDIUM) 200 MG tablet Take 1 tablet (200 mg total) by mouth 3 (three) times daily as needed for pain. 05/24/17  Yes Palumbo, April, MD  polyethylene glycol [facility]) packet Take 17 g by mouth daily. 09/25/16  Yes Long, Wonda Olds, MD  Benzocaine (BOIL-EASE EX) Apply 1 application topically daily as needed (pain from boils).    [provider]  benztropine (COGENTIN) 1 MG tablet Take 1 tablet (1 mg total) by mouth 2 (two) times daily. Patient not taking: Reported on 04/26/2017 12/31/16   Tanna Furry, MD  senna-docusate (DOK PLUS) 8.6-50 MG tablet Take 1 tablet by mouth daily as needed for mild constipation.    [provider]  Spacer/Aero-Holding Chambers (AEROCHAMBER PLUS WITH MASK) inhaler Use as instructed 04/22/12   Riki Altes, MD  traMADol (ULTRAM) 50 MG tablet Take 1 tablet (50 mg total) by mouth every 6 (six) hours as needed. Patient not taking: Reported on 04/26/2017 11/19/16   Veryl Speak, MD    Family History Family History  Problem Relation Age of Onset  . Diabetes Father   . Diabetes Sister   . Diabetes Brother     Social History Social History   Tobacco Use  . Smoking status: Current Every Day Smoker    Packs/day: 0.50    Types: Cigarettes  . Smokeless tobacco: Never Used  Substance Use Topics  . Alcohol use: No  . Drug use: No     Allergies     Amoxicillin; Morphine and related; Percocet [oxycodone-acetaminophen]; Percodan [oxycodone-aspirin]; Cymbalta [duloxetine hcl]; Gabapentin; Lexapro [escitalopram]; Penicillins; Percodan [oxycodone-aspirin]; Torecan [thiethylperazine]; and Sulfa antibiotics   Review of Systems Review of Systems  Constitutional: Negative for activity change, appetite change, diaphoresis, fatigue, fever and unexpected weight change.  HENT: Negative for mouth sores and trouble swallowing.   Respiratory: Negative for cough, chest tightness, shortness of breath, wheezing and stridor.   Cardiovascular: Negative for chest pain and palpitations.  Gastrointestinal: Positive for diarrhea. Negative for abdominal distention, abdominal pain, blood in stool, constipation, nausea, rectal pain and vomiting.  Genitourinary: Negative for difficulty urinating, dysuria, flank pain, frequency, hematuria and urgency.  Musculoskeletal: Negative for back pain, myalgias, neck pain and neck stiffness.  Skin: Negative for rash.  Neurological: Negative for weakness.  Hematological: Negative for adenopathy.  Psychiatric/Behavioral:  Negative for confusion.  All other systems reviewed and are negative.    Physical Exam Updated Vital Signs BP (!) 174/83 (BP Location: Left Arm)   Pulse (!) 118   Temp 99.5 F (37.5 C) (Oral)   Resp 18   Ht 5\' 4"  (1.626 m)   Wt 49 kg (108 lb)   SpO2 98%   BMI 18.54 kg/m   Physical Exam  Constitutional: She is oriented to person, place, and time. No distress.  Chronically, cachetic, ill-appearing elderly female  HENT:  Head: Normocephalic.  Eyes: Conjunctivae are normal.  Neck: Normal range of motion. Neck supple.  Cardiovascular: Normal rate, regular rhythm, normal heart sounds and intact distal pulses. Exam reveals no gallop and no friction rub.  No murmur heard. Pulmonary/Chest: Effort normal and breath sounds normal. No stridor. No respiratory distress. She has no wheezes. She has no  rales. She exhibits no tenderness.  Abdominal: Soft. Bowel sounds are normal. She exhibits no distension and no mass. There is no tenderness. There is no rebound and no guarding. No hernia.  Abdomen is soft, nontender, nondistended.  Normoactive bowel sounds.  No rebound or guarding.  No CVA tenderness bilaterally.  Musculoskeletal: Normal range of motion. She exhibits no edema, tenderness or deformity.  No focal tenderness to palpation of the right hand or forearm.  Radial pulses are 2+ and symmetric.  Right hand is held in a contracted position, which is the patient's baseline secondary to her right-sided hemiplegia from previous CVA.   Neurological: She is alert and oriented to person, place, and time.  Skin: Skin is warm. Capillary refill takes less than 2 seconds. No rash noted. No erythema. No pallor.  Psychiatric: Her behavior is normal.  Nursing note and vitals reviewed.    ED Treatments / Results  Labs (all labs ordered are listed, but only abnormal results are displayed) Labs Reviewed  CBC - Abnormal; Notable for the following components:      Result Value   Hemoglobin 9.5 (*)    HCT 30.6 (*)    MCV 73.9 (*)    MCH 22.9 (*)    RDW 17.8 (*)    All other components within normal limits  COMPREHENSIVE METABOLIC PANEL - Abnormal; Notable for the following components:   Potassium 3.2 (*)    Glucose, Bld 175 (*)    Creatinine, Ser 0.38 (*)    AST 11 (*)    ALT 7 (*)    Total Bilirubin 0.1 (*)    All other components within normal limits  LIPASE, BLOOD  URINALYSIS, ROUTINE W REFLEX MICROSCOPIC    EKG None  Radiology No results found.  Procedures Procedures (including critical care time)  Medications Ordered in ED Medications  sodium chloride 0.9 % bolus 500 mL (0 mLs Intravenous Stopped 07/26/17 1924)     Initial Impression / Assessment and Plan / ED Course  I have reviewed the triage vital signs and the nursing notes.  Pertinent labs & imaging results that  were available during my care of the patient were reviewed by me and considered in my medical decision making (see chart for details).     62 year old female with a h/o of uncontrolled DM Type II, CVA with right-sided hemiplegia, CAD, and hypothyroidism presenting with nonbloody diarrhea, onset 9 AM. No constitutional symptoms.  No N/V.  Hemoglobin 9.5, increased from previous.  CBC is consistent with the patient's baseline. Lipase is normal. IVF bolus given. She reports generalized weakness has resolved.  She has  had no additional episodes of diarrhea since arrival in the ED.  She has requested Fentanyl for her right hand pain "that has been going on since her stroke" numerous times with arrival. Ice and tylenol offered, but patient declined.  Recommended follow-up with her PCP in 3-4 days for recheck of her symptoms and to follow-up with her chronic right hand pain since she has been evaluated previously for this symptom in the ED and her exam was unremarkable. She reports she has an appointment with pain management in May.  The patient was discussed with Dr. Leonette Monarch, attending physician.  Strict return precautions given.  She is hemodynamically stable and in no acute distress at this time.  She is safe for discharge to home and outpatient follow-up at this time.  Final Clinical Impressions(s) / ED Diagnoses   Final diagnoses:  Functional diarrhea  Chronic hand pain, right    ED Discharge Orders    None       Joanne Gavel, PA-C 07/26/17 2005    Fatima Blank, MD 07/26/17 [phone]

## 2017-07-26 NOTE — Discharge Instructions (Addendum)
Please call and schedule a follow-up appointment with your primary care provider for a reevaluation in the next 3-4 days.  If your hand and arm pain do not start to improve, you can follow-up with primary care at this time as well.  Please avoid taking any enemas over the next few days.  If you start to struggle with constipation, MiraLAX is available over-the-counter. You can also try taking 4 ounces of prune juice and mixing it with 4 ounces of grape juice and warming in the microwave for 30 seconds to try and relieve constipation.  If you develop any new or worsening symptoms including fever, vomiting, blood in your diarrhea, or severe abdominal pain, please return to the emergency department for re-evaluation.

## 2017-07-26 NOTE — ED Triage Notes (Signed)
Pt used enema today-now having diarrhea-pt to triage in w/c-NAD

## 2017-08-23 ENCOUNTER — Encounter (HOSPITAL_BASED_OUTPATIENT_CLINIC_OR_DEPARTMENT_OTHER): Payer: Self-pay | Admitting: Emergency Medicine

## 2017-08-23 ENCOUNTER — Other Ambulatory Visit: Payer: Self-pay

## 2017-08-23 ENCOUNTER — Emergency Department (HOSPITAL_BASED_OUTPATIENT_CLINIC_OR_DEPARTMENT_OTHER)
Admission: EM | Admit: 2017-08-23 | Discharge: 2017-08-23 | Disposition: A | Discharge status: Home / Self Care | Payer: Medicaid Other | Attending: Emergency Medicine | Admitting: Emergency Medicine

## 2017-08-23 DIAGNOSIS — Z853 Personal history of malignant neoplasm of breast: Secondary | ICD-10-CM | POA: Diagnosis not present

## 2017-08-23 DIAGNOSIS — Z79899 Other long term (current) drug therapy: Secondary | ICD-10-CM | POA: Diagnosis not present

## 2017-08-23 DIAGNOSIS — F17210 Nicotine dependence, cigarettes, uncomplicated: Secondary | ICD-10-CM | POA: Diagnosis not present

## 2017-08-23 DIAGNOSIS — E119 Type 2 diabetes mellitus without complications: Secondary | ICD-10-CM | POA: Insufficient documentation

## 2017-08-23 DIAGNOSIS — E039 Hypothyroidism, unspecified: Secondary | ICD-10-CM | POA: Diagnosis not present

## 2017-08-23 DIAGNOSIS — I69351 Hemiplegia and hemiparesis following cerebral infarction affecting right dominant side: Secondary | ICD-10-CM | POA: Insufficient documentation

## 2017-08-23 DIAGNOSIS — Z7902 Long term (current) use of antithrombotics/antiplatelets: Secondary | ICD-10-CM | POA: Diagnosis not present

## 2017-08-23 DIAGNOSIS — M25511 Pain in right shoulder: Secondary | ICD-10-CM | POA: Diagnosis not present

## 2017-08-23 DIAGNOSIS — K5900 Constipation, unspecified: Secondary | ICD-10-CM | POA: Diagnosis not present

## 2017-08-23 DIAGNOSIS — I10 Essential (primary) hypertension: Secondary | ICD-10-CM | POA: Diagnosis not present

## 2017-08-23 DIAGNOSIS — I252 Old myocardial infarction: Secondary | ICD-10-CM | POA: Diagnosis not present

## 2017-08-23 DIAGNOSIS — R350 Frequency of micturition: Secondary | ICD-10-CM | POA: Diagnosis not present

## 2017-08-23 DIAGNOSIS — Z7982 Long term (current) use of aspirin: Secondary | ICD-10-CM | POA: Insufficient documentation

## 2017-08-23 DIAGNOSIS — M25512 Pain in left shoulder: Secondary | ICD-10-CM | POA: Diagnosis not present

## 2017-08-23 DIAGNOSIS — R300 Dysuria: Secondary | ICD-10-CM

## 2017-08-23 DIAGNOSIS — I2510 Atherosclerotic heart disease of native coronary artery without angina pectoris: Secondary | ICD-10-CM | POA: Insufficient documentation

## 2017-08-23 LAB — URINALYSIS, ROUTINE W REFLEX MICROSCOPIC
BILIRUBIN URINE: NEGATIVE
Glucose, UA: NEGATIVE mg/dL
Hgb urine dipstick: NEGATIVE
KETONES UR: NEGATIVE mg/dL
LEUKOCYTES UA: NEGATIVE
NITRITE: NEGATIVE
Protein, ur: NEGATIVE mg/dL
Specific Gravity, Urine: <1.005 — ABNORMAL LOW (ref 1.005–1.030)
pH: 6.5 (ref 5.0–8.0)

## 2017-08-23 LAB — CBC WITH DIFFERENTIAL/PLATELET
BASOS ABS: 0 10*3/uL (ref 0.0–0.1)
Basophils Relative: 0 %
EOS ABS: 0.2 10*3/uL (ref 0.0–0.7)
Eosinophils Relative: 2 %
HCT: 31.6 % — ABNORMAL LOW (ref 36.0–46.0)
Hemoglobin: 10.2 g/dL — ABNORMAL LOW (ref 12.0–15.0)
Lymphocytes Relative: 32 %
Lymphs Abs: 3.2 10*3/uL (ref 0.7–4.0)
MCH: 23.9 pg — ABNORMAL LOW (ref 26.0–34.0)
MCHC: 32.3 g/dL (ref 30.0–36.0)
MCV: 74.2 fL — ABNORMAL LOW (ref 78.0–100.0)
Monocytes Absolute: 1.1 10*3/uL — ABNORMAL HIGH (ref 0.1–1.0)
Monocytes Relative: 11 %
NEUTROS PCT: 55 %
Neutro Abs: 5.5 10*3/uL (ref 1.7–7.7)
Platelets: 337 10*3/uL (ref 150–400)
RBC: 4.26 MIL/uL (ref 3.87–5.11)
RDW: 18.9 % — AB (ref 11.5–15.5)
WBC: 9.9 10*3/uL (ref 4.0–10.5)

## 2017-08-23 LAB — BASIC METABOLIC PANEL
Anion gap: 9 (ref 5–15)
BUN: 6 mg/dL (ref 6–20)
CALCIUM: 9.2 mg/dL (ref 8.9–10.3)
CO2: 24 mmol/L (ref 22–32)
CREATININE: 0.46 mg/dL (ref 0.44–1.00)
Chloride: 106 mmol/L (ref 101–111)
GFR calc non Af Amer: >60 mL/min (ref >60)
Glucose, Bld: 176 mg/dL — ABNORMAL HIGH (ref 65–99)
Potassium: 4.7 mmol/L (ref 3.5–5.1)
SODIUM: 139 mmol/L (ref 135–145)

## 2017-08-23 MED ORDER — SODIUM CHLORIDE 0.9 % IV BOLUS
500.0000 mL | Freq: Once | INTRAVENOUS | Status: AC
  Administered 2017-08-23: 500 mL via INTRAVENOUS

## 2017-08-23 MED ORDER — PHENAZOPYRIDINE HCL 200 MG PO TABS: 200.0000 mg | ORAL_TABLET | Freq: Three times a day (TID) | ORAL | 0 refills | Status: AC

## 2017-08-23 MED ORDER — FENTANYL CITRATE (PF) 100 MCG/2ML IJ SOLN
50.0000 ug | Freq: Once | INTRAMUSCULAR | Status: AC
  Administered 2017-08-23: 50 ug via INTRAVENOUS
  Filled 2017-08-23: qty 2

## 2017-08-23 NOTE — ED Notes (Signed)
Family at bedside. 

## 2017-08-23 NOTE — ED Triage Notes (Signed)
Dysuria and frequency x 3 days.

## 2017-08-23 NOTE — Discharge Instructions (Signed)
Please read and follow all provided instructions.  Your diagnoses today include:  1. Dysuria     Tests performed today include:  Urine test - no infection seen  Blood counts and electrolytes  Vital signs. See below for your results today.   Medications prescribed:   Pyridium - medication for urinary tract infection symptoms.   This medication will turn your urine orange. This is normal.   Take any prescribed medications only as directed.  Home care instructions:  Follow any educational materials contained in this packet.  BE VERY CAREFUL not to take multiple medicines containing Tylenol (also called acetaminophen). Doing so can lead to an overdose which can damage your liver and cause liver failure and possibly death.   Follow-up instructions: Please follow-up with your primary care provider in the next 3 days for further evaluation of your symptoms.   Return instructions:   Please return to the Emergency Department if you experience worsening symptoms.   Please return if you have any other emergent concerns.  Additional Information:  Your vital signs today were: BP (!) 149/63 (BP Location: Left Arm)    Pulse 96    Temp 99 F (37.2 C) (Oral)    Resp 18    Ht 5\' 4"  (1.626 m)    Wt 47.6 kg (105 lb)    SpO2 100%    BMI 18.02 kg/m  If your blood pressure (BP) was elevated above 135/85 this visit, please have this repeated by your doctor within one month. --------------

## 2017-08-23 NOTE — ED Provider Notes (Signed)
[facility] EMERGENCY DEPARTMENT Provider Note   CSN: [id number] Arrival date & time: 08/23/17  1509     History   Chief Complaint Chief Complaint  Patient presents with  . Dysuria    HPI Laura Oconnor is a 62 y.o. female.  Patient with history of chronic pain, previous stroke presents the emergency department today with complaint of dysuria, increased frequency, feeling dehydrated and weak over the past 3 days.  Patient is concerned that she is dehydrated.  She denies any fevers, chest pain, shortness of breath.  She denies abdominal pain or diarrhea.  She states that she is constipated.  No blood in the urine.  She is also concerned that she may have a urinary tract infection.  No other treatments prior to arrival.  She states that she takes ibuprofen for her chronic pain but states that does not has not been helping recently.  She complains of pain in her shoulders currently. The onset of this condition was acute. The course is constant. Aggravating factors: none. Alleviating factors: none.       Past Medical History:  Diagnosis Date  . Chronic arm pain   . Chronic pain syndrome   . Decubital ulcer   . Diabetes mellitus   . Heart attack ([place])   . History of right coronary artery stent placement    2 stents placed at separate times  . Hypertension   . Stroke ([place])   . Thyroid disease     Patient Active Problem List   Diagnosis Date Noted  . Acute on chronic anemia 04/26/2017  . TIA (transient ischemic attack) 02/22/2017  . Right hip pain 02/09/2017  . Right shoulder pain 01/13/2017  . History of CVA with residual deficit 10/16/2016  . Weakness as late effect of cerebrovascular accident (CVA) 08/09/2016  . Depression with anxiety 08/04/2016  . Hemiplegia affecting right dominant side ([place]) 05/31/2016  . Hypokalemia 08/13/2014  . Diarrhea 08/13/2014  . Loss of weight 08/13/2014  . Hypocalcemia 08/13/2014  . Atrophic vaginitis 08/13/2014  . Type 2 diabetes  mellitus, uncontrolled ([place]) 08/13/2014  . Breast cancer ([place]) 08/13/2014  . Hypothyroidism 08/13/2014  . CAD (coronary artery disease) 08/13/2014    Past Surgical History:  Procedure Laterality Date  . BLADDER SUSPENSION    . LIPOMA EXCISION       OB History   None      Home Medications    Prior to Admission medications   Medication Sig Start Date End Date Taking? Authorizing Provider  ACCU-CHEK AVIVA PLUS test strip USE TO TEST BLOOD SUGAR UP TO QID 02/19/17   [provider]  ACCU-CHEK SOFTCLIX LANCETS lancets TEST UP TO QID 02/19/17   [provider]  aspirin EC 81 MG tablet Take 81 mg by mouth daily.     [provider]  atorvastatin (LIPITOR) 80 MG tablet Take 40 mg by mouth every morning.     [provider]  Benzocaine (BOIL-EASE EX) Apply 1 application topically daily as needed (pain from boils).    [provider]  benztropine (COGENTIN) 1 MG tablet Take 1 tablet (1 mg total) by mouth 2 (two) times daily. Patient not taking: Reported on 04/26/2017 12/31/16   Tanna Furry, MD  clopidogrel (PLAVIX) 75 MG tablet Take 75 mg by mouth daily.    [provider]  Colloidal Oatmeal (ECZEMA MOISTURIZING EX) Apply 1 application topically daily as needed (for irritation on buttocks).    [provider]  docusate  sodium (COLACE) 100 MG capsule Take 100 mg by mouth every morning.    [provider]  ferrous sulfate 220 (44 Fe) MG/5ML solution Take 220 mg by mouth daily.    [provider]  ferrous sulfate 325 (65 FE) MG tablet Take 1 tablet (325 mg total) by mouth 2 (two) times daily with a meal. 08/14/14   Thurnell Lose, MD  gabapentin (NEURONTIN) 100 MG capsule as needed. 50mg  ?    [provider]  ibuprofen (ADVIL,MOTRIN) 200 MG tablet Take 100 mg by mouth every 6 (six) hours as needed for headache (pain).    [provider]  insulin glargine (LANTUS) 100 UNIT/ML injection Inject 20-30  Units into the skin See admin instructions. 20-30 units two times a day after meals (lunch and dinner) 11/17/16   [provider]  Insulin Syringe-Needle U-100 31G X 1/4" 1 ML MISC 1 Dose by Misc.(Non-Drug; Combo Route) route daily. Or as directed to inject insulin under the skin 11/17/16   [provider]  levothyroxine (SYNTHROID, LEVOTHROID) 137 MCG tablet Take 137 mcg by mouth daily before breakfast.    [provider]  LORazepam (ATIVAN) 0.5 MG tablet Take 0.5 mg by mouth 4 (four) times daily as needed for anxiety.  12/07/16   [provider]  OnabotulinumtoxinA (BOTOX IJ) every 3 (three) months. RIGHT ARM 03/19/17   [provider]  ondansetron (ZOFRAN-ODT) 4 MG disintegrating tablet Take 4 mg by mouth every 6 (six) hours as needed for nausea or vomiting.  12/22/16   [provider]  phenazopyridine (PYRIDIUM) 200 MG tablet Take 1 tablet (200 mg total) by mouth 3 (three) times daily as needed for pain. 05/24/17   Palumbo, April, MD  polyethylene glycol [facility]) packet Take 17 g by mouth daily. 09/25/16   Long, Wonda Olds, MD  senna-docusate (DOK PLUS) 8.6-50 MG tablet Take 1 tablet by mouth daily as needed for mild constipation.    [provider]  Spacer/Aero-Holding Chambers (AEROCHAMBER PLUS WITH MASK) inhaler Use as instructed 04/22/12   Riki Altes, MD  traMADol (ULTRAM) 50 MG tablet Take 1 tablet (50 mg total) by mouth every 6 (six) hours as needed. Patient not taking: Reported on 04/26/2017 11/19/16   Veryl Speak, MD    Family History Family History  Problem Relation Age of Onset  . Diabetes Father   . Diabetes Sister   . Diabetes Brother     Social History Social History   Tobacco Use  . Smoking status: Current Every Day Smoker    Packs/day: 0.50    Types: Cigarettes  . Smokeless tobacco: Never Used  Substance Use Topics  . Alcohol use: No  . Drug use: No     Allergies   Amoxicillin; Morphine and related; Percocet  [oxycodone-acetaminophen]; Percodan [oxycodone-aspirin]; Cymbalta [duloxetine hcl]; Gabapentin; Lexapro [escitalopram]; Penicillins; Percodan [oxycodone-aspirin]; Torecan [thiethylperazine]; and Sulfa antibiotics   Review of Systems Review of Systems  Constitutional: Positive for fatigue. Negative for fever.  HENT: Negative for rhinorrhea and sore throat.   Eyes: Negative for redness.  Respiratory: Negative for cough.   Cardiovascular: Negative for chest pain.  Gastrointestinal: Positive for constipation. Negative for abdominal pain, diarrhea, nausea and vomiting.  Genitourinary: Positive for dysuria and frequency.  Musculoskeletal: Positive for arthralgias. Negative for myalgias.  Skin: Negative for rash.  Neurological: Positive for weakness. Negative for headaches.     Physical Exam Updated Vital Signs BP (!) 152/63 (BP Location: Left Arm)   Pulse (!) 109  Temp 99 F (37.2 C) (Oral)   Resp 20   Ht 5\' 4"  (1.626 m)   Wt 47.6 kg (105 lb)   SpO2 100%   BMI 18.02 kg/m   Physical Exam  Constitutional: She appears well-developed and well-nourished.  HENT:  Head: Normocephalic and atraumatic.  Mouth/Throat: Oropharynx is clear and moist.  Eyes: Conjunctivae are normal. Right eye exhibits no discharge. Left eye exhibits no discharge.  Neck: Normal range of motion. Neck supple.  Cardiovascular: Normal rate, regular rhythm and normal heart sounds.  Pulmonary/Chest: Effort normal and breath sounds normal. No respiratory distress. She has no wheezes. She has no rales.  Abdominal: Soft. There is no tenderness. There is no rebound and no guarding.  No abdominal tenderness elicited with palpation.   Neurological: She is alert.  Chronic facial droop from previous CVA. Speech is slurred.   Skin: Skin is warm and dry.  Psychiatric: She has a normal mood and affect.  Nursing note and vitals reviewed.    ED Treatments / Results  Labs (all labs ordered are listed, but only abnormal  results are displayed) Labs Reviewed  URINALYSIS, ROUTINE W REFLEX MICROSCOPIC - Abnormal; Notable for the following components:      Result Value   Specific Gravity, Urine <1.005 (*)    All other components within normal limits  CBC WITH DIFFERENTIAL/PLATELET - Abnormal; Notable for the following components:   Hemoglobin 10.2 (*)    HCT 31.6 (*)    MCV 74.2 (*)    MCH 23.9 (*)    RDW 18.9 (*)    Monocytes Absolute 1.1 (*)    All other components within normal limits  BASIC METABOLIC PANEL - Abnormal; Notable for the following components:   Glucose, Bld 176 (*)    All other components within normal limits    EKG None  Radiology No results found.  Procedures Procedures (including critical care time)  Medications Ordered in ED Medications  sodium chloride 0.9 % bolus 500 mL (500 mLs Intravenous New Bag/Given 08/23/17 1713)  fentaNYL (SUBLIMAZE) injection 50 mcg (50 mcg Intravenous Given 08/23/17 1614)  sodium chloride 0.9 % bolus 500 mL (0 mLs Intravenous Stopped 08/23/17 1710)     Initial Impression / Assessment and Plan / ED Course  I have reviewed the triage vital signs and the nursing notes.  Pertinent labs & imaging results that were available during my care of the patient were reviewed by me and considered in my medical decision making (see chart for details).     Patient seen and examined.  Reviewed reassuring UA results.  Patient feels that she will feel better if she received some fluids.  Will check basic labs given her chronic medical conditions and give fluids and 50 mcg of fentanyl.  Anticipate discharge to home if these are reassuring.  Vital signs reviewed and are as follows: BP (!) 152/63 (BP Location: Left Arm)   Pulse (!) 109   Temp 99 F (37.2 C) (Oral)   Resp 20   Ht 5\' 4"  (1.626 m)   Wt 47.6 kg (105 lb)   SpO2 100%   BMI 18.02 kg/m   5:02 PM patient and son updated on results.  She requests additional fluids.  Additional 500 cc fluid bolus  ordered.  Anticipate discharge to home with close PCP follow-up after this is completed.  Patient and family voiced comfort with the plan. Will d/c with trial of azo.   5:51 PM Fluids completed.   Patient urged to  return with worsening symptoms or other concerns. Patient verbalized understanding and agrees with plan.    Final Clinical Impressions(s) / ED Diagnoses   Final diagnoses:  Dysuria   Patient with dysuria symptoms.  No infection.  Azo trial for symptom control.  Patient treated with IV hydration here with improvement.  Lab work with hemoglobin that is slightly higher than baseline for patient.  Normal kidney function.  Mild hyperglycemia without acidosis.  Feel patient is safe for discharged home with close PCP follow-up as above.  ED Discharge Orders        Ordered    phenazopyridine (PYRIDIUM) 200 MG tablet  3 times daily     08/23/17 1750       Carlisle Cater, PA-C 08/23/17 1753    Julianne Rice, MD 08/23/17 [phone]

## 2017-09-24 ENCOUNTER — Emergency Department (HOSPITAL_BASED_OUTPATIENT_CLINIC_OR_DEPARTMENT_OTHER)
Admission: EM | Admit: 2017-09-24 | Discharge: 2017-09-24 | Disposition: A | Discharge status: Home / Self Care | Payer: Medicaid Other | Attending: Emergency Medicine | Admitting: Emergency Medicine

## 2017-09-24 ENCOUNTER — Other Ambulatory Visit: Payer: Self-pay

## 2017-09-24 ENCOUNTER — Emergency Department (HOSPITAL_BASED_OUTPATIENT_CLINIC_OR_DEPARTMENT_OTHER): Payer: Medicaid Other

## 2017-09-24 ENCOUNTER — Encounter (HOSPITAL_BASED_OUTPATIENT_CLINIC_OR_DEPARTMENT_OTHER): Payer: Self-pay | Admitting: *Deleted

## 2017-09-24 DIAGNOSIS — Z79899 Other long term (current) drug therapy: Secondary | ICD-10-CM | POA: Insufficient documentation

## 2017-09-24 DIAGNOSIS — F17210 Nicotine dependence, cigarettes, uncomplicated: Secondary | ICD-10-CM | POA: Insufficient documentation

## 2017-09-24 DIAGNOSIS — I10 Essential (primary) hypertension: Secondary | ICD-10-CM | POA: Diagnosis not present

## 2017-09-24 DIAGNOSIS — M25511 Pain in right shoulder: Secondary | ICD-10-CM | POA: Diagnosis present

## 2017-09-24 DIAGNOSIS — Z7902 Long term (current) use of antithrombotics/antiplatelets: Secondary | ICD-10-CM | POA: Insufficient documentation

## 2017-09-24 DIAGNOSIS — M62838 Other muscle spasm: Secondary | ICD-10-CM | POA: Diagnosis not present

## 2017-09-24 DIAGNOSIS — I2510 Atherosclerotic heart disease of native coronary artery without angina pectoris: Secondary | ICD-10-CM | POA: Insufficient documentation

## 2017-09-24 DIAGNOSIS — Z7982 Long term (current) use of aspirin: Secondary | ICD-10-CM | POA: Insufficient documentation

## 2017-09-24 DIAGNOSIS — E039 Hypothyroidism, unspecified: Secondary | ICD-10-CM | POA: Insufficient documentation

## 2017-09-24 DIAGNOSIS — Z794 Long term (current) use of insulin: Secondary | ICD-10-CM | POA: Diagnosis not present

## 2017-09-24 DIAGNOSIS — E119 Type 2 diabetes mellitus without complications: Secondary | ICD-10-CM | POA: Insufficient documentation

## 2017-09-24 LAB — BASIC METABOLIC PANEL
Anion gap: 7 (ref 5–15)
BUN: 8 mg/dL (ref 6–20)
CO2: 30 mmol/L (ref 22–32)
Calcium: 9.4 mg/dL (ref 8.9–10.3)
Chloride: 102 mmol/L (ref 101–111)
Creatinine, Ser: 0.37 mg/dL — ABNORMAL LOW (ref 0.44–1.00)
GFR calc Af Amer: >60 mL/min (ref >60)
GFR calc non Af Amer: >60 mL/min (ref >60)
GLUCOSE: 162 mg/dL — AB (ref 65–99)
POTASSIUM: 3.8 mmol/L (ref 3.5–5.1)
Sodium: 139 mmol/L (ref 135–145)

## 2017-09-24 LAB — CBC WITH DIFFERENTIAL/PLATELET
BASOS ABS: 0 10*3/uL (ref 0.0–0.1)
Basophils Relative: 0 %
Eosinophils Absolute: 0.1 10*3/uL (ref 0.0–0.7)
Eosinophils Relative: 2 %
HEMATOCRIT: 33.2 % — AB (ref 36.0–46.0)
Hemoglobin: 10.7 g/dL — ABNORMAL LOW (ref 12.0–15.0)
LYMPHS ABS: 3.1 10*3/uL (ref 0.7–4.0)
LYMPHS PCT: 36 %
MCH: 24.5 pg — AB (ref 26.0–34.0)
MCHC: 32.2 g/dL (ref 30.0–36.0)
MCV: 76 fL — AB (ref 78.0–100.0)
MONO ABS: 0.9 10*3/uL (ref 0.1–1.0)
Monocytes Relative: 11 %
NEUTROS ABS: 4.5 10*3/uL (ref 1.7–7.7)
Neutrophils Relative %: 51 %
Platelets: 318 10*3/uL (ref 150–400)
RBC: 4.37 MIL/uL (ref 3.87–5.11)
RDW: 20.5 % — AB (ref 11.5–15.5)
WBC: 8.6 10*3/uL (ref 4.0–10.5)

## 2017-09-24 LAB — TROPONIN I: Troponin I: <0.03 ng/mL (ref <0.03)

## 2017-09-24 MED ORDER — KETOROLAC TROMETHAMINE 30 MG/ML IJ SOLN
30.0000 mg | Freq: Once | INTRAMUSCULAR | Status: DC
  Filled 2017-09-24: qty 1

## 2017-09-24 MED ORDER — CYCLOBENZAPRINE HCL 5 MG PO TABS: 5.0000 mg | ORAL_TABLET | Freq: Three times a day (TID) | ORAL | 0 refills | Status: AC | PRN

## 2017-09-24 MED ORDER — KETOROLAC TROMETHAMINE 30 MG/ML IJ SOLN
30.0000 mg | Freq: Once | INTRAMUSCULAR | Status: AC
  Administered 2017-09-24: 30 mg via INTRAVENOUS

## 2017-09-24 MED ORDER — FENTANYL CITRATE (PF) 100 MCG/2ML IJ SOLN
50.0000 ug | Freq: Once | INTRAMUSCULAR | Status: AC
  Administered 2017-09-24: 50 ug via INTRAVENOUS
  Filled 2017-09-24: qty 2

## 2017-09-24 MED ORDER — HYDROCODONE-ACETAMINOPHEN 5-325 MG PO TABS: 1.0000 | ORAL_TABLET | Freq: Once | ORAL | Status: DC

## 2017-09-24 NOTE — ED Notes (Signed)
No changes, resting calm, still, alert, NAD, interactive, c/o severe pain. Declined norco d/t "it upsets my stomach".

## 2017-09-24 NOTE — ED Provider Notes (Signed)
[facility] Provider Note   CSN: [id number] Arrival date & time: 09/24/17  1245     History   Chief Complaint Chief Complaint  Patient presents with  . Shoulder Pain    HPI Laura Oconnor is a 62 y.o. female.  HPI  This is a 62 year old female with a history of stroke, hypertension, diabetes, chronic pain syndrome who presents with right shoulder pain.  Patient reports acute onset of right shoulder pain around 1 AM.  She states that it is sharp and nonradiating.  She denies chest pain or shortness of breath.  She denies injury.  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  She has not had any fevers or noted any rash.  Patient rates her pain at 10 out of 10.  She occasionally takes ibuprofen but did not take any this morning.  Patient denies any numbness or tingling of the hand.  Patient also reports that she feels she is dehydrated.  She does not drink a lot because it makes her have to go to the restroom.  Past Medical History:  Diagnosis Date  . Chronic arm pain   . Chronic pain syndrome   . Decubital ulcer   . Diabetes mellitus   . Heart attack ([place])   . History of right coronary artery stent placement    2 stents placed at separate times  . Hypertension   . Stroke ([place])   . Thyroid disease     Patient Active Problem List   Diagnosis Date Noted  . Acute on chronic anemia 04/26/2017  . TIA (transient ischemic attack) 02/22/2017  . Right hip pain 02/09/2017  . Right shoulder pain 01/13/2017  . History of CVA with residual deficit 10/16/2016  . Weakness as late effect of cerebrovascular accident (CVA) 08/09/2016  . Depression with anxiety 08/04/2016  . Hemiplegia affecting right dominant side ([place]) 05/31/2016  . Hypokalemia 08/13/2014  . Diarrhea 08/13/2014  . Loss of weight 08/13/2014  . Hypocalcemia 08/13/2014  . Atrophic vaginitis 08/13/2014  . Type 2 diabetes  mellitus, uncontrolled ([place]) 08/13/2014  . Breast cancer ([place]) 08/13/2014  . Hypothyroidism 08/13/2014  . CAD (coronary artery disease) 08/13/2014    Past Surgical History:  Procedure Laterality Date  . BLADDER SUSPENSION    . LIPOMA EXCISION       OB History   None      Home Medications    Prior to Admission medications   Medication Sig Start Date End Date Taking? Authorizing Provider  ACCU-CHEK AVIVA PLUS test strip USE TO TEST BLOOD SUGAR UP TO QID 02/19/17   [provider]  ACCU-CHEK SOFTCLIX LANCETS lancets TEST UP TO QID 02/19/17   [provider]  aspirin EC 81 MG tablet Take 81 mg by mouth daily.     [provider]  atorvastatin (LIPITOR) 80 MG tablet Take 40 mg by mouth every morning.     [provider]  Benzocaine (BOIL-EASE EX) Apply 1 application topically daily as needed (pain from boils).    [provider]  benztropine (COGENTIN) 1 MG tablet Take 1 tablet (1 mg total) by mouth 2 (two) times daily. Patient not taking: Reported on 04/26/2017 12/31/16   Tanna Furry, MD  clopidogrel (PLAVIX) 75 MG tablet Take 75 mg by mouth daily.    [provider]  Colloidal Oatmeal (ECZEMA MOISTURIZING EX) Apply 1 application topically daily as needed (for  irritation on buttocks).    [provider]  cyclobenzaprine (FLEXERIL) 5 MG tablet Take 1 tablet (5 mg total) by mouth 3 (three) times daily as needed for muscle spasms. 09/24/17   Loran Auguste, Barbette Hair, MD  docusate sodium (COLACE) 100 MG capsule Take 100 mg by mouth every morning.    [provider]  ferrous sulfate 220 (44 Fe) MG/5ML solution Take 220 mg by mouth daily.    [provider]  ferrous sulfate 325 (65 FE) MG tablet Take 1 tablet (325 mg total) by mouth 2 (two) times daily with a meal. 08/14/14   Thurnell Lose, MD  gabapentin (NEURONTIN) 100 MG capsule as needed. 50mg  ?    [provider]  ibuprofen (ADVIL,MOTRIN) 200 MG tablet  Take 100 mg by mouth every 6 (six) hours as needed for headache (pain).    [provider]  insulin glargine (LANTUS) 100 UNIT/ML injection Inject 20-30 Units into the skin See admin instructions. 20-30 units two times a day after meals (lunch and dinner) 11/17/16   [provider]  Insulin Syringe-Needle U-100 31G X 1/4" 1 ML MISC 1 Dose by Misc.(Non-Drug; Combo Route) route daily. Or as directed to inject insulin under the skin 11/17/16   [provider]  levothyroxine (SYNTHROID, LEVOTHROID) 137 MCG tablet Take 137 mcg by mouth daily before breakfast.    [provider]  LORazepam (ATIVAN) 0.5 MG tablet Take 0.5 mg by mouth 4 (four) times daily as needed for anxiety.  12/07/16   [provider]  OnabotulinumtoxinA (BOTOX IJ) every 3 (three) months. RIGHT ARM 03/19/17   [provider]  ondansetron (ZOFRAN-ODT) 4 MG disintegrating tablet Take 4 mg by mouth every 6 (six) hours as needed for nausea or vomiting.  12/22/16   [provider]  phenazopyridine (PYRIDIUM) 200 MG tablet Take 1 tablet (200 mg total) by mouth 3 (three) times daily. 08/23/17   Carlisle Cater, PA-C  polyethylene glycol (MIRALAX) packet Take 17 g by mouth daily. 09/25/16   Long, Wonda Olds, MD  senna-docusate (DOK PLUS) 8.6-50 MG tablet Take 1 tablet by mouth daily as needed for mild constipation.    [provider]  Spacer/Aero-Holding Chambers (AEROCHAMBER PLUS WITH MASK) inhaler Use as instructed 04/22/12   Riki Altes, MD  traMADol (ULTRAM) 50 MG tablet Take 1 tablet (50 mg total) by mouth every 6 (six) hours as needed. Patient not taking: Reported on 04/26/2017 11/19/16   Veryl Speak, MD    Family History Family History  Problem Relation Age of Onset  . Diabetes Father   . Diabetes Sister   . Diabetes Brother     Social History Social History   Tobacco Use  . Smoking status: Current Every Day Smoker    Packs/day: 0.50    Types: Cigarettes  . Smokeless  tobacco: Never Used  Substance Use Topics  . Alcohol use: No  . Drug use: No     Allergies   Amoxicillin; Morphine and related; Percocet [oxycodone-acetaminophen]; Percodan [oxycodone-aspirin]; Cymbalta [duloxetine hcl]; Gabapentin; Lexapro [escitalopram]; Penicillins; Percodan [oxycodone-aspirin]; Torecan [thiethylperazine]; and Sulfa antibiotics   Review of Systems Review of Systems  Constitutional: Negative for fever.  Respiratory: Negative for shortness of breath.   Cardiovascular: Negative for chest pain.  Gastrointestinal: Negative for abdominal pain, nausea and vomiting.  Genitourinary: Negative for dysuria.  Musculoskeletal:       Right shoulder pain  Skin: Negative for rash.  Neurological: Positive for weakness. Negative for numbness.  All other  systems reviewed and are negative.    Physical Exam Updated Vital Signs BP 140/65   Pulse (!) 101   Temp 98.7 F (37.1 C) (Oral)   Resp 13   SpO2 95%   Physical Exam  Constitutional: She is oriented to person, place, and time.  Chronically ill-appearing, no acute distress  HENT:  Head: Normocephalic and atraumatic.  Right-sided facial droop noted Mucous membranes moist  Eyes: Pupils are equal, round, and reactive to light.  Cardiovascular: Normal rate, regular rhythm and normal heart sounds.  No murmur heard. Pulmonary/Chest: Effort normal and breath sounds normal. No respiratory distress. She has no wheezes.  Abdominal: Soft. Bowel sounds are normal. There is no tenderness.  Musculoskeletal: She exhibits no edema.  Tenderness to palpation over the right scapula, no deformities noted, no overlying skin changes, very minimal range of motion of the right shoulder  Neurological: She is alert and oriented to person, place, and time.  Right facial droop noted, flaccid paralysis of the right upper and right lower extremity  Skin: Skin is warm and dry.  Psychiatric: She has a normal mood and affect.  Nursing note and  vitals reviewed.    ED Treatments / Results  Labs (all labs ordered are listed, but only abnormal results are displayed) Labs Reviewed  CBC WITH DIFFERENTIAL/PLATELET - Abnormal; Notable for the following components:      Result Value   Hemoglobin 10.7 (*)    HCT 33.2 (*)    MCV 76.0 (*)    MCH 24.5 (*)    RDW 20.5 (*)    All other components within normal limits  BASIC METABOLIC PANEL - Abnormal; Notable for the following components:   Glucose, Bld 162 (*)    Creatinine, Ser 0.37 (*)    All other components within normal limits  TROPONIN I  URINALYSIS, ROUTINE W REFLEX MICROSCOPIC    EKG None  Radiology Dg Chest 2 View  Result Date: 09/24/2017 CLINICAL DATA:  Posterior right chest wall pain. EXAM: CHEST - 2 VIEW COMPARISON:  Radiograph 07/19/2017.  CT 04/17/2017 FINDINGS: The cardiomediastinal contours are normal. The lungs are clear. Pulmonary vasculature is normal. No consolidation, pleural effusion, or pneumothorax. Inferior displacement of the right humeral head is unchanged from prior chest radiograph and CT and likely chronic. The bones appear under mineralized. IMPRESSION: No acute findings. Electronically Signed   By: Jeb Levering M.D.   On: 09/24/2017 05:23    Procedures Procedures (including critical care time) ED ECG REPORT   Date: 09/24/2017  Rate: 103  Rhythm: sinus tachycardia  QRS Axis: normal  Intervals: normal  ST/T Wave abnormalities: LVH  Conduction Disutrbances:none  Narrative Interpretation:   Old EKG Reviewed: none available  I have personally reviewed the EKG tracing and agree with the computerized printout as noted.   Medications Ordered in ED Medications  fentaNYL (SUBLIMAZE) injection 50 mcg (50 mcg Intravenous Given 09/24/17 0444)  ketorolac (TORADOL) 30 MG/ML injection 30 mg (30 mg Intravenous Given 09/24/17 0510)     Initial Impression / Assessment and Plan / ED Course  I have reviewed the triage vital signs and the nursing  notes.  Pertinent labs & imaging results that were available during my care of the patient were reviewed by me and considered in my medical decision making (see chart for details).     Patient presents with scapula pain and shoulder pain.  Onset 1 AM.  She is overall nontoxic-appearing and vital signs are reassuring.  She denies chest pain  or shortness of breath.  Pain is reproducible on exam most suggestive of musculoskeletal etiology.  Given that she denies chest pain or shortness of breath, would feel that dissection or cardiac etiology would be less likely.  However, she does have a history of heart disease.  EKG shows no evidence of acute ischemia.  Troponin is negative.  Chest x-ray shows no evidence of mediastinal widening, mass, pneumonia, or pneumothorax.  Patient was given fentanyl and Toradol.  On recheck, she is resting comfortably without complaint.  She reports improvement of her pain.  She does have a history of chronic pain syndrome.  She does not take anything on a regular basis.  Will discharge.  Patient tolerates NSAIDs.  She is instructed to take NSAIDs as tolerated and will be given a short course of muscle relaxant.  After history, exam, and medical workup I feel the patient has been appropriately medically screened and is safe for discharge home. Pertinent diagnoses were discussed with the patient. Patient was given return precautions.   Final Clinical Impressions(s) / ED Diagnoses   Final diagnoses:  Muscle spasm of left shoulder area    ED Discharge Orders        Ordered    cyclobenzaprine (FLEXERIL) 5 MG tablet  3 times daily PRN     09/24/17 0534       Merryl Hacker, MD 09/24/17 [phone]

## 2017-09-24 NOTE — ED Triage Notes (Addendum)
Here by EMS from home for R scapular pain, "feels like shoulder blade is coming out of skin".   Alert, NAD, calm, interactive, resps e/u, speaking with usual slurred speech, no dyspnea noted, skin W&D. H/o CAD, CVA, R sided paralysis, slurred speech, DM, and anemia.   Seen here 1 month ago.

## 2017-09-24 NOTE — ED Notes (Signed)
Purewick was placed to collect urine due to patient being unable to ambulate.

## 2017-09-24 NOTE — Discharge Instructions (Addendum)
You were seen today for right scapular pain.  This may be related to muscle spasms.  Your x-ray and other testing is reassuring.  Follow-up closely with your primary physician.  You will be given a short course of muscle relaxants.  Make sure do not combine these with other sedating medications.

## 2017-09-24 NOTE — ED Notes (Signed)
To xray by stretcher

## 2017-10-08 ENCOUNTER — Telehealth: Payer: Self-pay

## 2017-10-08 NOTE — Telephone Encounter (Signed)
Phone call placed to patient to offer to schedule visit with Palliative Care. Patient requested a call back in 15 minutes to the number of [phone]

## 2017-10-08 NOTE — Telephone Encounter (Signed)
Phone call placed to patient's son, [name], to offer to schedule visit with Palliative care. Education provided regarding palliative care. Son shared that patient had a CVA with residual pain and also experiences anxiety and panic attacks.

## 2017-10-09 ENCOUNTER — Other Ambulatory Visit: Payer: Medicaid Other | Admitting: Internal Medicine

## 2017-10-23 ENCOUNTER — Other Ambulatory Visit: Payer: Medicaid Other | Admitting: Internal Medicine

## 2017-11-13 ENCOUNTER — Other Ambulatory Visit: Payer: Medicaid Other | Admitting: Internal Medicine

## 2017-11-13 ENCOUNTER — Other Ambulatory Visit: Payer: Medicaid Other | Admitting: Licensed Clinical Social Worker

## 2017-11-13 DIAGNOSIS — Z515 Encounter for palliative care: Secondary | ICD-10-CM

## 2017-11-13 DIAGNOSIS — F418 Other specified anxiety disorders: Secondary | ICD-10-CM

## 2017-11-13 NOTE — Progress Notes (Signed)
PALLIATIVE CARE CONSULT VISIT   PATIENT NAME: Laura Oconnor DOB: 08/07/1955 MRN: [id number]  PRIMARY CARE PROVIDER:   Clovia Cuff, MD  REFERRING PROVIDER:  Clovia Cuff, MD [address] [place], [place] [address]  RESPONSIBLE PARTY:   [phone] and Laura Oconnor(son)([phone]      RECOMMENDATIONS and PLAN:  1.  Depression with anxiety  F41.8:  Anxiety is exacerbated currently.  Not well managed with use of Lorazepam 0.5mg  qid and related to long term effects post CVA. Consider restart of daily use med for improvement of anxiety and depression as well as therapist.  Continue journaling. Behavioral Health ER visit prn.  Attempts to contact Laura Jan, NP at psychiatry practice unsuccessful.  2. Palliative Care Encounter Z51.5:  Patient continues to desire living at son's home with personal assistance.  She is hesitant to release son from the 59 of his caregiving duties and rely upon her in-home aid. This increases her anxiety.  She has mentioned living in a facility rather than home.  She plans on discussing same with PCP.  Palliative will continue to follow.  I spent 60 minutes providing this consultation at home,  from 10:30am to 11:30am. More than 50% of the time in this consultation was spent coordinating communication with patient and son.Marland Kitchen   HISTORY OF PRESENT ILLNESS: Follow-up with Laura Oconnor. Patient and son report increased anxiety daily that she normally attempts control with Lorazepam.  She is sleeping less and having increased paranoia.  At least 1-2 visits at home by EMS due to anxiety without transport to hospital..  She "is afraid to go outside" and normally keeps curtains closed. Pending visit with Laura Oconnor and social worker. CODE STATUS: DNR/DNI  PPS: 40% HOSPICE ELIGIBILITY/DIAGNOSIS: TBD  PAST MEDICAL HISTORY:  Past Medical History:  Diagnosis Date  . Chronic arm pain   . Chronic pain syndrome   . Decubital ulcer   .  Diabetes mellitus   . Heart attack ([place])   . History of right coronary artery stent placement    2 stents placed at separate times  . Hypertension   . Stroke ([place])   . Thyroid disease     SOCIAL HX:  Social History   Tobacco Use  . Smoking status: Current Every Day Smoker    Packs/day: 0.50    Types: Cigarettes  . Smokeless tobacco: Never Used  Substance Use Topics  . Alcohol use: No    ALLERGIES:  Allergies  Allergen Reactions  . Amoxicillin Nausea And Vomiting  . Morphine And Related Nausea And Vomiting and Other (See Comments)    Also passed out  . Percocet [Oxycodone-Acetaminophen] Nausea And Vomiting  . Percodan [Oxycodone-Aspirin] Nausea And Vomiting  . Cymbalta [Duloxetine Hcl] Other (See Comments)    Tremors/uncontrolled movement per son  . Gabapentin Other (See Comments)    Tremors/uncontrolled movements per son  . Lexapro [Escitalopram] Other (See Comments)  . Penicillins Nausea And Vomiting and Swelling    Tongue swelling Has patient had a PCN reaction causing immediate rash, facial/tongue/throat swelling, SOB or lightheadedness with hypotension: Yes Has patient had a PCN reaction causing severe rash involving mucus membranes or skin necrosis: Unknown Has patient had a PCN reaction that required hospitalization: Unknown Has patient had a PCN reaction occurring within the last 10 years: Unknown If all of the above answers are "NO", then may proceed with Cephalosporin use.  Marland Kitchen Percodan [Oxycodone-Aspirin] Nausea And Vomiting  . Torecan [Thiethylperazine] Other (See Comments)  Pt does not recall this medication  . Sulfa Antibiotics Nausea And Vomiting and Rash     PERTINENT MEDICATIONS:  Outpatient Encounter Medications as of 11/13/2017  Medication Sig  . ACCU-CHEK AVIVA PLUS test strip USE TO TEST BLOOD SUGAR UP TO QID  . ACCU-CHEK SOFTCLIX LANCETS lancets TEST UP TO QID  . aspirin EC 81 MG tablet Take 81 mg by mouth daily.   Marland Kitchen atorvastatin (LIPITOR) 80 MG  tablet Take 40 mg by mouth every morning.   . Benzocaine (BOIL-EASE EX) Apply 1 application topically daily as needed (pain from boils).  . benztropine (COGENTIN) 1 MG tablet Take 1 tablet (1 mg total) by mouth 2 (two) times daily. (Patient not taking: Reported on 04/26/2017)  . clopidogrel (PLAVIX) 75 MG tablet Take 75 mg by mouth daily.  . Colloidal Oatmeal (ECZEMA MOISTURIZING EX) Apply 1 application topically daily as needed (for irritation on buttocks).  . cyclobenzaprine (FLEXERIL) 5 MG tablet Take 1 tablet (5 mg total) by mouth 3 (three) times daily as needed for muscle spasms.  Marland Kitchen docusate sodium (COLACE) 100 MG capsule Take 100 mg by mouth every morning.  . ferrous sulfate 220 (44 Fe) MG/5ML solution Take 220 mg by mouth daily.  . ferrous sulfate 325 (65 FE) MG tablet Take 1 tablet (325 mg total) by mouth 2 (two) times daily with a meal.  . gabapentin (NEURONTIN) 100 MG capsule as needed. 50mg  ?  . ibuprofen (ADVIL,MOTRIN) 200 MG tablet Take 100 mg by mouth every 6 (six) hours as needed for headache (pain).  . insulin glargine (LANTUS) 100 UNIT/ML injection Inject 20-30 Units into the skin See admin instructions. 20-30 units two times a day after meals (lunch and dinner)  . Insulin Syringe-Needle U-100 31G X 1/4" 1 ML MISC 1 Dose by Misc.(Non-Drug; Combo Route) route daily. Or as directed to inject insulin under the skin  . levothyroxine (SYNTHROID, LEVOTHROID) 137 MCG tablet Take 137 mcg by mouth daily before breakfast.  . LORazepam (ATIVAN) 0.5 MG tablet Take 0.5 mg by mouth 4 (four) times daily as needed for anxiety.   . OnabotulinumtoxinA (BOTOX IJ) every 3 (three) months. RIGHT ARM  . ondansetron (ZOFRAN-ODT) 4 MG disintegrating tablet Take 4 mg by mouth every 6 (six) hours as needed for nausea or vomiting.   . phenazopyridine (PYRIDIUM) 200 MG tablet Take 1 tablet (200 mg total) by mouth 3 (three) times daily.  . polyethylene glycol (MIRALAX) packet Take 17 g by mouth daily.  Marland Kitchen  senna-docusate (DOK PLUS) 8.6-50 MG tablet Take 1 tablet by mouth daily as needed for mild constipation.  Marland Kitchen Spacer/Aero-Holding Chambers (AEROCHAMBER PLUS WITH MASK) inhaler Use as instructed  . traMADol (ULTRAM) 50 MG tablet Take 1 tablet (50 mg total) by mouth every 6 (six) hours as needed. (Patient not taking: Reported on 04/26/2017)   No facility-administered encounter medications on file as of 11/13/2017.     PHYSICAL EXAM:   General:  Frail appearing, thin. Resting in wheelchair Cardiovascular: regular rate and rhythm Pulmonary: clear in all fields Abdomen: soft, nontender, + bowel sounds Extremities: no edema.  Contractures R hand Skin: Exposed skin is intact Neurological: A&Ox3.  R hemiplegia Psych:  Anxious and paranoid mood  Gonzella Lex, NP-C

## 2017-11-14 NOTE — Progress Notes (Signed)
COMMUNITY PALLIATIVE CARE SW NOTE  PATIENT NAME: Laura Oconnor DOB: [birth year] MRN: [id number]  PRIMARY CARE PROVIDER: Clovia Cuff, MD  RESPONSIBLE PARTY:  Acct ID - Guarantor Home Phone Work Phone Relationship Acct Type  [id number] LEZLY, RUMPF [phone]  Self P/F     [place], [place], [place] [address]     PLAN OF CARE and INTERVENTIONS:             1. GOALS OF CARE/ ADVANCE CARE PLANNING:  Patient wishes to regain the use of her extremities effected by her stroke.   2. SOCIAL/EMOTIONAL/SPIRITUAL ASSESSMENT/ INTERVENTIONS:  SW met with patient and her son, [name], per the request of NP, Gonzella Lex.  Patient's right side was effected by her stroke and she said she experiences pain in shoulder and hand.  Patient reported feeling anxious and scared.  She asks her son to close all of the curtains in the home around 6pm.  She said she is afraid of falling since she reports being dropped after her stroke while in a facility.  Her son, who works in vocational rehabilitation, has explored several resources available to patient.  Recently, patient does not wish to leave the house and wants a therapist to provide counseling in her home.  She expressed feelings of grief and loss.  SW provided active listening and supportive counseling.  NP is addressing medication concerns regarding pain. 3. PATIENT/CAREGIVER EDUCATION/ COPING:  SW provided education regarding Palliative Care SW role, stress relief techniques and available resources.  Patient and her son cope by expressing their feelings openly. 4. PERSONAL EMERGENCY PLAN:  Patient rings a bell when she requires assistance. 5. COMMUNITY RESOURCES COORDINATION/ HEALTH CARE NAVIGATION:  Patient is on the waiting list for [facility].  Her son currently cares for her.  6. FINANCIAL/LEGAL CONCERNS/INTERVENTIONS:  Patient has a fixed income.     SOCIAL HX:  Social History   Tobacco Use  . Smoking status: Current Every Day Smoker    Packs/day: 0.50     Types: Cigarettes  . Smokeless tobacco: Never Used  Substance Use Topics  . Alcohol use: No    CODE STATUS:  Per chart, patient is a partial code.  ADVANCED DIRECTIVES: N MOST FORM COMPLETE:   HOSPICE EDUCATION PROVIDED:  Not during current visit. PPS:  Patient's appetite has decreased.  Son said patient can walk with a walker, but patient denies.      Creola Corn Juanna Pudlo, LCSW

## 2017-11-23 ENCOUNTER — Other Ambulatory Visit: Payer: Medicaid Other | Admitting: Licensed Clinical Social Worker

## 2017-11-23 DIAGNOSIS — Z515 Encounter for palliative care: Secondary | ICD-10-CM

## 2017-11-27 NOTE — Progress Notes (Signed)
COMMUNITY PALLIATIVE CARE SW NOTE  PATIENT NAME: Laura Oconnor DOB: [birth year] MRN: [id number]  PRIMARY CARE PROVIDER: Clovia Cuff, MD  RESPONSIBLE PARTY:  Acct ID - Guarantor Home Phone Work Phone Relationship Acct Type  [id number] NEKITA, PITA [phone]  Self P/F     [place], [place], [place] [address]     PLAN OF CARE and INTERVENTIONS:             1. GOALS OF CARE/ ADVANCE CARE PLANNING:  Patient wishes to remain with her son in their home.  Patient is a partial code. 2. SOCIAL/EMOTIONAL/SPIRITUAL ASSESSMENT/ INTERVENTIONS:  SW met with patient and her son, [name], in their home.  Patient denied pain during this visit.  She said she had a panic attack shortly before visit and took medication which helped relieve the symptoms.  She reports when she naps or sleeps at night is when she usually has them.  Patient reports the attacks start in her heart.  SW provided education and discussed guided imagery to help decrease her symptoms.  She was open to the concept.  [name] said he also has panic attacks and gave good examples for patient to use. 3. PATIENT/CAREGIVER EDUCATION/ COPING:  Patient copes by expressing her feelings openly.  SW provided education regarding depression and anxiety. 4. PERSONAL EMERGENCY PLAN:  Patient will inform her son with any emergencies. 5. COMMUNITY RESOURCES COORDINATION/ HEALTH CARE NAVIGATION:  Patient is to receive PT/OT/ST. 6. FINANCIAL/LEGAL CONCERNS/INTERVENTIONS:  Patient is on a fixed income.     SOCIAL HX:  Social History   Tobacco Use  . Smoking status: Current Every Day Smoker    Packs/day: 0.50    Types: Cigarettes  . Smokeless tobacco: Never Used  Substance Use Topics  . Alcohol use: No    CODE STATUS:  Patient is a partial code.  ADVANCED DIRECTIVES: N MOST FORM COMPLETE:  N HOSPICE EDUCATION PROVIDED: N  PPS:  Patient's appetite is normal, although patient appears frail.  Patient was observed sitting in her w/c. Duration  of visit and documentation:  75 minutes.      Creola Corn Jaylyne Breese, LCSW

## 2017-11-30 ENCOUNTER — Other Ambulatory Visit: Payer: Medicaid Other | Admitting: Licensed Clinical Social Worker

## 2017-11-30 DIAGNOSIS — Z515 Encounter for palliative care: Secondary | ICD-10-CM

## 2017-11-30 NOTE — Progress Notes (Signed)
COMMUNITY PALLIATIVE CARE SW NOTE  PATIENT NAME: Laura Oconnor DOB: 1955/10/09 MRN: [id number]  PRIMARY CARE PROVIDER: Clovia Cuff, MD  RESPONSIBLE PARTY:  Acct ID - Guarantor Home Phone Work Phone Relationship Acct Type  [id number] Laura Oconnor, Laura Oconnor [phone]  Self P/F     [place], [place], [place] [address]     PLAN OF CARE and INTERVENTIONS:             1. GOALS OF CARE/ ADVANCE CARE PLANNING:  Patient wants to remain with her son at home.  Patient is a partial code. 2. SOCIAL/EMOTIONAL/SPIRITUAL ASSESSMENT/ INTERVENTIONS:  SW met with patient in her home.  Patient's son, Laura Oconnor, was present.  Patient said she had a panic attack that lasted more than an hour last night.  EMS was contacted and her vitals were normal per son.  Patient declined to go to the hospital.  She reports not having these episodes prior to her stroke.  SW provided gentle touch which she appeared to respond positively to.  She reports touch helps her. 3. PATIENT/CAREGIVER EDUCATION/ COPING:  Patient copes by expressing herself.  SW continues providing education regarding Palliative Care. 4. PERSONAL EMERGENCY PLAN:  Patient will inform her son or call 911/ 5. COMMUNITY RESOURCES COORDINATION/ HEALTH CARE NAVIGATION:  Patient received OT today. 6. FINANCIAL/LEGAL CONCERNS/INTERVENTIONS:  Patient is on a fixed income.     SOCIAL HX:  Social History   Tobacco Use  . Smoking status: Current Every Day Smoker    Packs/day: 0.50    Types: Cigarettes  . Smokeless tobacco: Never Used  Substance Use Topics  . Alcohol use: No    CODE STATUS:  Patient is a partial code. ADVANCED DIRECTIVES: N MOST FORM COMPLETE:  N HOSPICE EDUCATION PROVIDED:  N  PPS:  Patient's appetite is normal.  She requires assistance to stand and ambulates with a walker. Duration of visit and documentation:  75 minutes.      Creola Corn Starlett Pehrson, LCSW

## 2017-12-03 ENCOUNTER — Other Ambulatory Visit: Payer: Medicaid Other | Admitting: Internal Medicine

## 2017-12-05 DIAGNOSIS — Z515 Encounter for palliative care: Secondary | ICD-10-CM | POA: Insufficient documentation

## 2017-12-05 DIAGNOSIS — R531 Weakness: Secondary | ICD-10-CM | POA: Insufficient documentation

## 2017-12-05 NOTE — Progress Notes (Signed)
PALLIATIVE CARE CONSULT VISIT   PATIENT NAME: Laura Oconnor DOB: 02/05/1956 MRN: [id number]  PRIMARY CARE PROVIDER:   Clovia Cuff, MD  REFERRING PROVIDER:  Clovia Cuff, MD [address] [place], [place] [address]  RESPONSIBLE PARTY:   Self and [phone]      RECOMMENDATIONS and PLAN:   1. Weakness  R 53.1:  Chronic state related to previous CVA.  Balanced meals and continue exercises as recommended by PT. Resources provided for agency that may provide night time services per request of patient.  2..  Depression with anxiety  F41.8:    Not well managed with use of Lorazepam 0.5mg  qid. Pt is hesitant to begin new rx of Celexa 10mg  as she is for any med other than Lorazepam which she runs out of monthly.  Explanation of alternative methods to control anxiety.  Continue Education officer, museum f/u as well as Financial controller.   She agrees to begin Celexa 5mg  nightly.  Encouraged behavioral health ER visit if patient is in a crisis.  3.  Palliative care encounter Z51.5:  Pt chose to make modifications to MOST form.  She would like to have CPR attempted but no intubation, continue intermediate interventions, IV and antibiotics if indicated and no feeding tube.  New MOST form was completed and left in home with patient.  DNR form discarded. She has contemplated relocation into a SNF but no final decision at this time. Support given.  Palliative care will continue to follow.  I spent 60 minutes providing this consultation,  from 2:30pmto 3:00pm. More than 50% of the time in this consultation was spent coordinating communication.   HISTORY OF PRESENT ILLNESS: Follow-up with [name].  No reports of hospitalizations or ER visits but patient and son report ongoing episodes of anxiety with "panic attacks" and paranoia.  Pt uses Lorazepam 4-5 times per day and historically, has declined use of or reacted to SSRIs.  She requires total care and requests that her son provides for her  needs even though she has in-home assistance. Son is asking for more assistance with patient care.    CODE STATUS: FULL CODE  PPS: 40% Mobile via wheelchair HOSPICE ELIGIBILITY/DIAGNOSIS: TBD  PAST MEDICAL HISTORY:  Past Medical History:  Diagnosis Date  . Chronic arm pain   . Chronic pain syndrome   . Decubital ulcer   . Diabetes mellitus   . Heart attack ([facility])   . History of right coronary artery stent placement    2 stents placed at separate times  . Hypertension   . Stroke ([place] [facility])   . Thyroid disease     SOCIAL HX:  Social History   Tobacco Use  . Smoking status: Current Every Day Smoker    Packs/day: 0.50    Types: Cigarettes  . Smokeless tobacco: Never Used  Substance Use Topics  . Alcohol use: No    ALLERGIES:  Allergies  Allergen Reactions  . Amoxicillin Nausea And Vomiting  . Morphine And Related Nausea And Vomiting and Other (See Comments)    Also passed out  . Percocet [Oxycodone-Acetaminophen] Nausea And Vomiting  . Percodan [Oxycodone-Aspirin] Nausea And Vomiting  . Cymbalta [Duloxetine Hcl] Other (See Comments)    Tremors/uncontrolled movement per son  . Gabapentin Other (See Comments)    Tremors/uncontrolled movements per son  . Lexapro [Escitalopram] Other (See Comments)  . Penicillins Nausea And Vomiting and Swelling    Tongue swelling Has patient had a PCN reaction causing immediate rash, facial/tongue/throat  swelling, SOB or lightheadedness with hypotension: Yes Has patient had a PCN reaction causing severe rash involving mucus membranes or skin necrosis: Unknown Has patient had a PCN reaction that required hospitalization: Unknown Has patient had a PCN reaction occurring within the last 10 years: Unknown If all of the above answers are "NO", then may proceed with Cephalosporin use.  Marland Kitchen Percodan [Oxycodone-Aspirin] Nausea And Vomiting  . Torecan [Thiethylperazine] Other (See Comments)    Pt does not recall this medication  . Sulfa Antibiotics  Nausea And Vomiting and Rash     PERTINENT MEDICATIONS:  Outpatient Encounter Medications as of 11/13/2017  Medication Sig  . ACCU-CHEK AVIVA PLUS test strip USE TO TEST BLOOD SUGAR UP TO QID  . ACCU-CHEK SOFTCLIX LANCETS lancets TEST UP TO QID  . aspirin EC 81 MG tablet Take 81 mg by mouth daily.   Marland Kitchen atorvastatin (LIPITOR) 80 MG tablet Take 40 mg by mouth every morning.   . Benzocaine (BOIL-EASE EX) Apply 1 application topically daily as needed (pain from boils).  . benztropine (COGENTIN) 1 MG tablet Take 1 tablet (1 mg total) by mouth 2 (two) times daily. (Patient not taking: Reported on 04/26/2017)  . clopidogrel (PLAVIX) 75 MG tablet Take 75 mg by mouth daily.  . Colloidal Oatmeal (ECZEMA MOISTURIZING EX) Apply 1 application topically daily as needed (for irritation on buttocks).  . cyclobenzaprine (FLEXERIL) 5 MG tablet Take 1 tablet (5 mg total) by mouth 3 (three) times daily as needed for muscle spasms.  Marland Kitchen docusate sodium (COLACE) 100 MG capsule Take 100 mg by mouth every morning.  . ferrous sulfate 220 (44 Fe) MG/5ML solution Take 220 mg by mouth daily.  . ferrous sulfate 325 (65 FE) MG tablet Take 1 tablet (325 mg total) by mouth 2 (two) times daily with a meal.  . gabapentin (NEURONTIN) 100 MG capsule as needed. 50mg  ?  . ibuprofen (ADVIL,MOTRIN) 200 MG tablet Take 100 mg by mouth every 6 (six) hours as needed for headache (pain).  . insulin glargine (LANTUS) 100 UNIT/ML injection Inject 20-30 Units into the skin See admin instructions. 20-30 units two times a day after meals (lunch and dinner)  . Insulin Syringe-Needle U-100 31G X 1/4" 1 ML MISC 1 Dose by Misc.(Non-Drug; Combo Route) route daily. Or as directed to inject insulin under the skin  . levothyroxine (SYNTHROID, LEVOTHROID) 137 MCG tablet Take 137 mcg by mouth daily before breakfast.  . LORazepam (ATIVAN) 0.5 MG tablet Take 0.5 mg by mouth 4 (four) times daily as needed for anxiety.   . OnabotulinumtoxinA (BOTOX IJ) every 3  (three) months. RIGHT ARM  . ondansetron (ZOFRAN-ODT) 4 MG disintegrating tablet Take 4 mg by mouth every 6 (six) hours as needed for nausea or vomiting.   . phenazopyridine (PYRIDIUM) 200 MG tablet Take 1 tablet (200 mg total) by mouth 3 (three) times daily.  . polyethylene glycol (MIRALAX) packet Take 17 g by mouth daily.  Marland Kitchen senna-docusate (DOK PLUS) 8.6-50 MG tablet Take 1 tablet by mouth daily as needed for mild constipation.  Marland Kitchen Spacer/Aero-Holding Chambers (AEROCHAMBER PLUS WITH MASK) inhaler Use as instructed  . traMADol (ULTRAM) 50 MG tablet Take 1 tablet (50 mg total) by mouth every 6 (six) hours as needed. (Patient not taking: Reported on 04/26/2017)   No facility-administered encounter medications on file as of 11/13/2017.     PHYSICAL EXAM:   General:  Frail appearing, thin Resting in wheelchair Cardiovascular: regular rate and rhythm Pulmonary: clear ant fields Abdomen: soft,  nontender, + bowel sounds Extremities: no edema, without muscle mass.. Contractures of right hand Skin: Exposed skin is intact Neurological: A&Ox3.  R hemiplegia Psych:  Anxious but pleasant mood.  Smiling and laughing. Changes conversation subject often.  Gonzella Lex, NP-C

## 2017-12-07 ENCOUNTER — Telehealth: Payer: Self-pay | Admitting: Licensed Clinical Social Worker

## 2017-12-07 ENCOUNTER — Other Ambulatory Visit: Payer: Medicaid Other | Admitting: Licensed Clinical Social Worker

## 2017-12-07 NOTE — Telephone Encounter (Signed)
Palliative Care SW returned patient's phone call.  She said she is receiving PT and OT today and wanted to change the visit to Monday, 8/26, at 11:30.

## 2017-12-10 ENCOUNTER — Other Ambulatory Visit: Payer: Medicaid Other | Admitting: Licensed Clinical Social Worker

## 2017-12-10 DIAGNOSIS — Z515 Encounter for palliative care: Secondary | ICD-10-CM

## 2017-12-11 NOTE — Progress Notes (Signed)
COMMUNITY PALLIATIVE CARE SW NOTE  PATIENT NAME: Laura Oconnor DOB: [birth year] MRN: [id number]  PRIMARY CARE PROVIDER: Clovia Cuff, MD  RESPONSIBLE PARTY:  Acct ID - Guarantor Home Phone Work Phone Relationship Acct Type  [id number] Laura Oconnor, Laura Oconnor [phone]  Self P/F     [place], [place], [place] [address]     PLAN OF CARE and INTERVENTIONS:             1. GOALS OF CARE/ ADVANCE CARE PLANNING:  Patient wishes to remain in her home with her son.  Patient is now a full code per NP, Laura Oconnor. 2. SOCIAL/EMOTIONAL/SPIRITUAL ASSESSMENT/ INTERVENTIONS:  SW met with patient and her son, Laura Oconnor.  Patient was sitting in her dining room.  SW provided gentle touch and patient appeared to respond positively to it by relaxing.  SW provided active listening and supportive counseling while patient's son discussed patient's reaction to medications.  Palliative Care NP is aware.  Patient also has an appointment scheduled with a counselor next month. 3. PATIENT/CAREGIVER EDUCATION/ COPING:  Patient and her son express their feelings openly.  SW provided education regarded resources available to patient regarding her mental health. 4. PERSONAL EMERGENCY PLAN:  Patient will inform her son with any emergencies or will call EMS. 5. COMMUNITY RESOURCES COORDINATION/ HEALTH CARE NAVIGATION:  Patient has a CNA three hours a day for five days per week.  She also received PT/OT 6. FINANCIAL/LEGAL CONCERNS/INTERVENTIONS:  Patient is on a fixed income.     SOCIAL HX:  Social History   Tobacco Use  . Smoking status: Current Every Day Smoker    Packs/day: 0.50    Types: Cigarettes  . Smokeless tobacco: Never Used  Substance Use Topics  . Alcohol use: No    CODE STATUS:  Full Code ADVANCED DIRECTIVES: N MOST FORM COMPLETE:  MOST form reviewed per NP. HOSPICE EDUCATION PROVIDED: N PPS:  Patient's appetite is normal.  Patient stated she cannot stand or ambulate independently. Duration of visit and  documentation:  60 minutes.      Laura Corn Laura Schlauch, LCSW

## 2017-12-28 ENCOUNTER — Other Ambulatory Visit: Payer: Medicaid Other | Admitting: Licensed Clinical Social Worker

## 2017-12-28 DIAGNOSIS — Z515 Encounter for palliative care: Secondary | ICD-10-CM

## 2017-12-31 NOTE — Progress Notes (Signed)
COMMUNITY PALLIATIVE CARE SW NOTE  PATIENT NAME: Laura Oconnor DOB: Mar 31, 1956 MRN: [id number]  PRIMARY CARE PROVIDER: Clovia Cuff, MD  RESPONSIBLE PARTY:  Acct ID - Guarantor Home Phone Work Phone Relationship Acct Type  [id number] Laura Oconnor [phone]  Self P/F     [address], [place], [place] [id number]     PLAN OF CARE and INTERVENTIONS:             1. GOALS OF CARE/ ADVANCE CARE PLANNING:  Patient wants to remain in her home with her son.  Patient is a partial code. 2. SOCIAL/EMOTIONAL/SPIRITUAL ASSESSMENT/ INTERVENTIONS:  SW met with patient in her home.  Her caregiver was also present.  SW provided gentle touch which she responds positively to.  Patient reports she is scheduled to see a therapist next week.  She also discussed having a panic attack recently and having to use medication to address her symptoms. 3. PATIENT/CAREGIVER EDUCATION/ COPING:  Patient copes by expressing her feelings.  SW provided education regarding decreasing anxiety. 4. PERSONAL EMERGENCY PLAN:  Patient will inform her son of medical needs or will call 911. 5. COMMUNITY RESOURCES COORDINATION/ HEALTH CARE NAVIGATION:  Patient has PT/OT and a caregiver about 4 hours a day. 6. FINANCIAL/LEGAL CONCERNS/INTERVENTIONS:  Patient is on a fixed income.     SOCIAL HX:  Social History   Tobacco Use  . Smoking status: Current Every Day Smoker    Packs/day: 0.50    Types: Cigarettes  . Smokeless tobacco: Never Used  Substance Use Topics  . Alcohol use: No    CODE STATUS:  Patient is a partial code.  ADVANCED DIRECTIVES: N MOST FORM COMPLETE:  N HOSPICE EDUCATION PROVIDED: N PPS:  Patient's appetite is normal.  She needs assistance to stand and ambulate. Duration of visit and documentation:  90 minutes.      Laura Corn Hildur Bayer, LCSW

## 2018-01-03 ENCOUNTER — Other Ambulatory Visit: Payer: Medicaid Other | Admitting: Internal Medicine

## 2018-01-09 ENCOUNTER — Emergency Department (HOSPITAL_BASED_OUTPATIENT_CLINIC_OR_DEPARTMENT_OTHER): Payer: Medicaid Other

## 2018-01-09 ENCOUNTER — Other Ambulatory Visit: Payer: Self-pay

## 2018-01-09 ENCOUNTER — Emergency Department (HOSPITAL_BASED_OUTPATIENT_CLINIC_OR_DEPARTMENT_OTHER)
Admission: EM | Admit: 2018-01-09 | Discharge: 2018-01-09 | Disposition: A | Discharge status: Home / Self Care | Payer: Medicaid Other | Attending: Emergency Medicine | Admitting: Emergency Medicine

## 2018-01-09 ENCOUNTER — Encounter (HOSPITAL_BASED_OUTPATIENT_CLINIC_OR_DEPARTMENT_OTHER): Payer: Self-pay | Admitting: *Deleted

## 2018-01-09 DIAGNOSIS — Z853 Personal history of malignant neoplasm of breast: Secondary | ICD-10-CM | POA: Diagnosis not present

## 2018-01-09 DIAGNOSIS — M79604 Pain in right leg: Secondary | ICD-10-CM | POA: Insufficient documentation

## 2018-01-09 DIAGNOSIS — I10 Essential (primary) hypertension: Secondary | ICD-10-CM | POA: Diagnosis not present

## 2018-01-09 DIAGNOSIS — I2510 Atherosclerotic heart disease of native coronary artery without angina pectoris: Secondary | ICD-10-CM | POA: Insufficient documentation

## 2018-01-09 DIAGNOSIS — E119 Type 2 diabetes mellitus without complications: Secondary | ICD-10-CM | POA: Diagnosis not present

## 2018-01-09 DIAGNOSIS — F17210 Nicotine dependence, cigarettes, uncomplicated: Secondary | ICD-10-CM | POA: Diagnosis not present

## 2018-01-09 DIAGNOSIS — Z7982 Long term (current) use of aspirin: Secondary | ICD-10-CM | POA: Insufficient documentation

## 2018-01-09 DIAGNOSIS — Z7902 Long term (current) use of antithrombotics/antiplatelets: Secondary | ICD-10-CM | POA: Diagnosis not present

## 2018-01-09 DIAGNOSIS — Z8673 Personal history of transient ischemic attack (TIA), and cerebral infarction without residual deficits: Secondary | ICD-10-CM | POA: Insufficient documentation

## 2018-01-09 DIAGNOSIS — Z79899 Other long term (current) drug therapy: Secondary | ICD-10-CM | POA: Diagnosis not present

## 2018-01-09 DIAGNOSIS — Z794 Long term (current) use of insulin: Secondary | ICD-10-CM | POA: Insufficient documentation

## 2018-01-09 MED ORDER — ACETAMINOPHEN 160 MG/5ML PO SOLN
650.0000 mg | Freq: Once | ORAL | Status: DC
  Filled 2018-01-09: qty 20.3

## 2018-01-09 MED ORDER — LIDOCAINE 5 % EX PTCH: 1.0000 | MEDICATED_PATCH | CUTANEOUS | 0 refills | Status: AC

## 2018-01-09 MED ORDER — FENTANYL CITRATE (PF) 100 MCG/2ML IJ SOLN
50.0000 ug | Freq: Once | INTRAMUSCULAR | Status: AC
Start: 2018-01-09 — End: 2018-01-09
  Administered 2018-01-09: 50 ug via INTRAMUSCULAR
  Filled 2018-01-09: qty 2

## 2018-01-09 MED ORDER — IBUPROFEN 100 MG/5ML PO SUSP
800.0000 mg | Freq: Once | ORAL | Status: DC
  Filled 2018-01-09: qty 40

## 2018-01-09 NOTE — ED Triage Notes (Signed)
Pt c/o right knee pain x 3 days hx if same, denies fall

## 2018-01-09 NOTE — ED Notes (Addendum)
Pt transported to [facility]

## 2018-01-09 NOTE — ED Provider Notes (Signed)
[facility] EMERGENCY DEPARTMENT Provider Note  CSN: [id number] Arrival date & time: 01/09/18  1411  History   Chief Complaint Chief Complaint  Patient presents with  . Knee Pain    HPI Laura Oconnor is a 62 y.o. female with a medical history of CAD, Type 2 DM, MI and stroke who presented to the ED for right leg pain x3 days. She describes gradually worsening knee and lower leg pain. Denies any precipitating injury such as fall or trauma. Patient has been wheelchair bound for the last year following a stroke. She is unable to move the right upper and lower extremity at all. Endorses leg swelling, tenderness to palpation and decreased ROM. Denies fever, recent open wounds/bites, erythema or warmth. Denies chest pain, SOB or palpitations.    Past Medical History:  Diagnosis Date  . Chronic arm pain   . Chronic pain syndrome   . Decubital ulcer   . Diabetes mellitus   . Heart attack ([place])   . History of right coronary artery stent placement    2 stents placed at separate times  . Hypertension   . Stroke ([place])   . Thyroid disease     Patient Active Problem List   Diagnosis Date Noted  . Palliative care encounter 12/05/2017  . Weakness 12/05/2017  . Acute on chronic anemia 04/26/2017  . TIA (transient ischemic attack) 02/22/2017  . Right hip pain 02/09/2017  . Right shoulder pain 01/13/2017  . History of CVA with residual deficit 10/16/2016  . Weakness as late effect of cerebrovascular accident (CVA) 08/09/2016  . Depression with anxiety 08/04/2016  . Hemiplegia affecting right dominant side ([place]) 05/31/2016  . Hypokalemia 08/13/2014  . Diarrhea 08/13/2014  . Loss of weight 08/13/2014  . Hypocalcemia 08/13/2014  . Atrophic vaginitis 08/13/2014  . Type 2 diabetes mellitus, uncontrolled ([place]) 08/13/2014  . Breast cancer ([place]) 08/13/2014  . Hypothyroidism 08/13/2014  . CAD (coronary artery disease) 08/13/2014    Past Surgical History:  Procedure Laterality Date    . BLADDER SUSPENSION    . LIPOMA EXCISION       OB History   None      Home Medications    Prior to Admission medications   Medication Sig Start Date End Date Taking? Authorizing Provider  ACCU-CHEK AVIVA PLUS test strip USE TO TEST BLOOD SUGAR UP TO QID 02/19/17   [provider]  ACCU-CHEK SOFTCLIX LANCETS lancets TEST UP TO QID 02/19/17   [provider]  aspirin EC 81 MG tablet Take 81 mg by mouth daily.     [provider]  atorvastatin (LIPITOR) 80 MG tablet Take 40 mg by mouth every morning.     [provider]  Benzocaine (BOIL-EASE EX) Apply 1 application topically daily as needed (pain from boils).    [provider]  benztropine (COGENTIN) 1 MG tablet Take 1 tablet (1 mg total) by mouth 2 (two) times daily. Patient not taking: Reported on 04/26/2017 12/31/16   Tanna Furry, MD  clopidogrel (PLAVIX) 75 MG tablet Take 75 mg by mouth daily.    [provider]  Colloidal Oatmeal (ECZEMA MOISTURIZING EX) Apply 1 application topically daily as needed (for irritation on buttocks).    [provider]  cyclobenzaprine (FLEXERIL) 5 MG tablet Take 1 tablet (5 mg total) by mouth 3 (three) times daily as needed for muscle spasms. 09/24/17   Horton, Barbette Hair, MD  docusate sodium (COLACE) 100 MG capsule Take 100 mg by mouth  every morning.    [provider]  ferrous sulfate 220 (44 Fe) MG/5ML solution Take 220 mg by mouth daily.    [provider]  ferrous sulfate 325 (65 FE) MG tablet Take 1 tablet (325 mg total) by mouth 2 (two) times daily with a meal. 08/14/14   Thurnell Lose, MD  gabapentin (NEURONTIN) 100 MG capsule as needed. 50mg  ?    [provider]  ibuprofen (ADVIL,MOTRIN) 200 MG tablet Take 100 mg by mouth every 6 (six) hours as needed for headache (pain).    [provider]  insulin glargine (LANTUS) 100 UNIT/ML injection Inject 20-30 Units into the skin See admin instructions.  20-30 units two times a day after meals (lunch and dinner) 11/17/16   [provider]  Insulin Syringe-Needle U-100 31G X 1/4" 1 ML MISC 1 Dose by Misc.(Non-Drug; Combo Route) route daily. Or as directed to inject insulin under the skin 11/17/16   [provider]  levothyroxine (SYNTHROID, LEVOTHROID) 137 MCG tablet Take 137 mcg by mouth daily before breakfast.    [provider]  lidocaine (LIDODERM) 5 % Place 1 patch onto the skin daily for 14 days. Remove & Discard patch within 12 hours or as directed by MD 01/09/18 01/23/18  Yahaira Bruski, Alvie Heidelberg I, PA-C  LORazepam (ATIVAN) 0.5 MG tablet Take 0.5 mg by mouth 4 (four) times daily as needed for anxiety.  12/07/16   [provider]  OnabotulinumtoxinA (BOTOX IJ) every 3 (three) months. RIGHT ARM 03/19/17   [provider]  ondansetron (ZOFRAN-ODT) 4 MG disintegrating tablet Take 4 mg by mouth every 6 (six) hours as needed for nausea or vomiting.  12/22/16   [provider]  phenazopyridine (PYRIDIUM) 200 MG tablet Take 1 tablet (200 mg total) by mouth 3 (three) times daily. 08/23/17   Carlisle Cater, PA-C  polyethylene glycol (MIRALAX) packet Take 17 g by mouth daily. 09/25/16   Long, Wonda Olds, MD  senna-docusate (DOK PLUS) 8.6-50 MG tablet Take 1 tablet by mouth daily as needed for mild constipation.    [provider]  Spacer/Aero-Holding Chambers (AEROCHAMBER PLUS WITH MASK) inhaler Use as instructed 04/22/12   Riki Altes, MD  traMADol (ULTRAM) 50 MG tablet Take 1 tablet (50 mg total) by mouth every 6 (six) hours as needed. Patient not taking: Reported on 04/26/2017 11/19/16   Veryl Speak, MD    Family History Family History  Problem Relation Age of Onset  . Diabetes Father   . Diabetes Sister   . Diabetes Brother     Social History Social History   Tobacco Use  . Smoking status: Current Every Day Smoker    Packs/day: 0.50    Types: Cigarettes  . Smokeless tobacco: Never Used  Substance  Use Topics  . Alcohol use: No  . Drug use: No     Allergies   Amoxicillin; Morphine and related; Percocet [oxycodone-acetaminophen]; Percodan [oxycodone-aspirin]; Acetaminophen; Cymbalta [duloxetine hcl]; Gabapentin; Lexapro [escitalopram]; Penicillins; Percodan [oxycodone-aspirin]; Torecan [thiethylperazine]; and Sulfa antibiotics   Review of Systems Review of Systems  Constitutional: Negative for chills and fever.  HENT: Negative.   Respiratory: Negative for cough, chest tightness and shortness of breath.   Cardiovascular: Positive for leg swelling. Negative for chest pain and palpitations.  Gastrointestinal: Negative.   Genitourinary: Negative.   Musculoskeletal: Positive for arthralgias and joint swelling.  Skin: Negative.   Neurological: Positive for weakness.       Unchanged from stroke  Hematological: Negative.    Physical  Exam Updated Vital Signs BP (!) 151/57 (BP Location: Left Arm)   Pulse (!) 101   Temp 98.6 F (37 C) (Oral)   Resp 16   Wt 47 kg   SpO2 100%   BMI 17.79 kg/m   Physical Exam  Constitutional:  Chronic ill appearance  Cardiovascular: Normal rate, regular rhythm and normal heart sounds.  Pulmonary/Chest: Effort normal and breath sounds normal.  Musculoskeletal:       Right knee: She exhibits decreased range of motion and swelling. Tenderness found.       Right ankle: Normal.       Right upper leg: She exhibits tenderness. She exhibits no swelling.       Right lower leg: She exhibits tenderness and swelling.       Right foot: Normal.  Right side hemiplegia  Nursing note and vitals reviewed.  ED Treatments / Results  Labs (all labs ordered are listed, but only abnormal results are displayed) Labs Reviewed - No data to display  EKG None  Radiology US Venous Img Lower Unilateral Right  Result Date: 01/09/2018 CLINICAL DATA:  Right lower extremity pain and edema. History of smoking. Evaluate for DVT. EXAM: RIGHT LOWER EXTREMITY VENOUS  DOPPLER ULTRASOUND TECHNIQUE: [place]-scale sonography with graded compression, as well as color Doppler and duplex ultrasound were performed to evaluate the lower extremity deep venous systems from the level of the common femoral vein and including the common femoral, femoral, profunda femoral, popliteal and calf veins including the posterior tibial, peroneal and gastrocnemius veins when visible. The superficial great saphenous vein was also interrogated. Spectral Doppler was utilized to evaluate flow at rest and with distal augmentation maneuvers in the common femoral, femoral and popliteal veins. COMPARISON:  None. FINDINGS: Contralateral Common Femoral Vein: Respiratory phasicity is normal and symmetric with the symptomatic side. No evidence of thrombus. Normal compressibility. Common Femoral Vein: No evidence of thrombus. Normal compressibility, respiratory phasicity and response to augmentation. Saphenofemoral Junction: No evidence of thrombus. Normal compressibility and flow on color Doppler imaging. Profunda Femoral Vein: No evidence of thrombus. Normal compressibility and flow on color Doppler imaging. Femoral Vein: No evidence of thrombus. Normal compressibility, respiratory phasicity and response to augmentation. Popliteal Vein: No evidence of thrombus. Normal compressibility, respiratory phasicity and response to augmentation. Calf Veins: No evidence of thrombus. Normal compressibility and flow on color Doppler imaging. Superficial Great Saphenous Vein: No evidence of thrombus. Normal compressibility. Venous Reflux:  None. Other Findings:  None. IMPRESSION: No evidence of DVT within the right lower extremity. Electronically Signed   By: Sandi Mariscal M.D.   On: 01/09/2018 16:36    Procedures Procedures (including critical care time)  Medications Ordered in ED Medications  ibuprofen (ADVIL,MOTRIN) 100 MG/5ML suspension 800 mg (800 mg Oral Refused 01/09/18 1736)  fentaNYL (SUBLIMAZE) injection 50 mcg  (has no administration in time range)     Initial Impression / Assessment and Plan / ED Course  Triage vital signs and the nursing notes have been reviewed.  Pertinent labs & imaging results that were available during care of the patient were reviewed and considered in medical decision making (see chart for details).  Patient presents to the ED for 3 day history of worsening of right lower extremity pain. Patient has no bony tenderness or history of trauma/fall that raise concern for fracture. Patient has right side hemiplegia from her stroke which is the affected side today. There is noticeable swelling in the right leg when compared to the left along with posterior  leg tenderness which raises concern for DVT. Will order U/S for further evaluation. No s/s that suggest an infectious or rheumatologic etiology to complaint.   Clinical Course as of Jan 10 1739  Wed Jan 09, 2018  1658 U/S negative for DVT.   [GM]  2119 Case discussed with Dr. Deno Etienne. Pain likely due to edema in the right extremity from patient's consistent dependent state. Acute and emergent etiologies have been evaluated and ruled out.   [GM]    Clinical Course User Index [GM] Odilia Damico, Jonelle Sports, PA-C   Final Clinical Impressions(s) / ED Diagnoses  1. Right Lower Extermity Pain. Education on OTC and supportive treatment for pain relief. Patient followed by palliative care. Rx for lidocaine patches prescribed.  Dispo: Home. After thorough clinical evaluation, this patient is determined to be medically stable and can be safely discharged with the previously mentioned treatment and/or outpatient follow-up/referral(s). At this time, there are no other apparent medical conditions that require further screening, evaluation or treatment.   Final diagnoses:  Right leg pain    ED Discharge Orders         Ordered    lidocaine (LIDODERM) 5 %  Every 24 hours     01/09/18 1740            Wardell Pokorski, [place] I,  PA-C 01/09/18 [place], [place], DO 01/09/18 [phone]

## 2018-01-09 NOTE — Discharge Instructions (Addendum)
The ultrasound of your leg is normal. There are no blood clots in your leg. I have no concern that you have any fractures or dislocations.   You may use your normal medications for pain. I have prescribed you Lidocaine patches to use. I also recommend that you use compression hose/stockings to help with swelling. When you are at home, keep the right leg elevated.  Thank you for allowing me to take care of you today!

## 2018-01-09 NOTE — ED Notes (Signed)
Med hold prior to discharge

## 2018-01-09 NOTE — ED Notes (Signed)
Patient transported to Ultrasound 

## 2018-01-11 ENCOUNTER — Other Ambulatory Visit: Payer: Medicaid Other | Admitting: Licensed Clinical Social Worker

## 2018-01-11 DIAGNOSIS — Z515 Encounter for palliative care: Secondary | ICD-10-CM

## 2018-01-14 NOTE — Progress Notes (Signed)
COMMUNITY PALLIATIVE CARE SW NOTE  PATIENT NAME: Laura Oconnor DOB: [birth year] MRN: [id number]  PRIMARY CARE PROVIDER: Clovia Cuff, MD  RESPONSIBLE PARTY:  Acct ID - Guarantor Home Phone Work Phone Relationship Acct Type  [id number] KAIANA, MARION [phone]  Self P/F     [place], [place], [place] [address]     PLAN OF CARE and INTERVENTIONS:             1. GOALS OF CARE/ ADVANCE CARE PLANNING:  Patient wants to remain at home with her son, [name].  She is a full code. 2. SOCIAL/EMOTIONAL/SPIRITUAL ASSESSMENT/ INTERVENTIONS:  SW met with patient in her home.  She was in her w/c in the kitchen area.  Discussed patient going to the emergency room a few days ago.  She stated her right leg hurt.  Patient participated in some deep breathing exercises and appeared to respond positively.  SW also provided gentle touch.  SW attempted to have patient express her feelings around her sons death.  She stated she did not wish to discuss. 3. PATIENT/CAREGIVER EDUCATION/ COPING:  Patient copes by expressing her needs openly.  Continue providing education regarding Palliative Care. 4. PERSONAL EMERGENCY PLAN:  Patient will usually contact her son or EMS. 5. COMMUNITY RESOURCES COORDINATION/ HEALTH CARE NAVIGATION:  Patient has a CNA three hours a day five days per week.  She also receives PT/OT. 6. FINANCIAL/LEGAL CONCERNS/INTERVENTIONS:  Patient is on a fixed income.     SOCIAL HX:  Social History   Tobacco Use  . Smoking status: Current Every Day Smoker    Packs/day: 0.50    Types: Cigarettes  . Smokeless tobacco: Never Used  Substance Use Topics  . Alcohol use: No    CODE STATUS:  Full Code  ADVANCED DIRECTIVES: N MOST FORM COMPLETE:  Pending HOSPICE EDUCATION PROVIDED:  N PPS:  Patient's appetite is normal.  She said she cannot stand or ambulate independently. Duration of visit and documentation:  75 minutes.      Creola Corn Gabbie Marzo, LCSW

## 2018-01-22 ENCOUNTER — Other Ambulatory Visit: Payer: Medicaid Other | Admitting: Internal Medicine

## 2018-01-22 DIAGNOSIS — R531 Weakness: Secondary | ICD-10-CM

## 2018-01-22 DIAGNOSIS — Z515 Encounter for palliative care: Secondary | ICD-10-CM

## 2018-01-22 DIAGNOSIS — F418 Other specified anxiety disorders: Secondary | ICD-10-CM

## 2018-01-22 DIAGNOSIS — Z87898 Personal history of other specified conditions: Secondary | ICD-10-CM

## 2018-01-22 NOTE — Progress Notes (Signed)
PALLIATIVE CARE CONSULT VISIT   PATIENT NAME: Laura Oconnor DOB: 11-22-1955 MRN: [id number]  PRIMARY CARE PROVIDER:   Clovia Cuff, MD  REFERRING PROVIDER:  Clovia Cuff, MD [address] [place], [place] [address]  RESPONSIBLE PARTY:   Self and [phone]      RECOMMENDATIONS and PLAN:   1. Weakness  R 53.1:  Chronic state but continue intermittent mobility with assistance. Continue protein beverages 1-2x/day  2..  Depression with anxiety  F41.8:    Chronic state and managed by behavioral health provider. Increased today related to R breast lump.  Pt. Discontinued use of Celexa.  Continue social worker visits for supportive care.   3.  History of lump of right breast lump Z87.898:  In light of previous  R lumpectomy.  Strongly encouraged to schedule mammogram that was last performed 3 years ago. Son will assist with scheduling appt.  FU with PCP.   4.  Palliative care encounter Z51.5: FULL CODE without intubation.  She plans to continue living at home with care from personal care provider and son.   Palliative care will continue to follow.  I spent 30 minutes providing this consultation,  from 1:00pmto 1:30pm at home. More than 50% of the time in this consultation was spent coordinating communication with patient and son.   HISTORY OF PRESENT ILLNESS: Follow-up with Laura Oconnor.  Patient reports feeling anxious related to the presence of a lump in her R breast which has been present for some time.  Last reported mammogram was 3 years ago at which time she had a "lumpectomy".  Denies breast pain.  No other complaints today.    CODE STATUS: FULL CODE without intubation  PPS: 40% Mobile via wheelchair HOSPICE ELIGIBILITY/DIAGNOSIS: TBD  PAST MEDICAL HISTORY:  Past Medical History:  Diagnosis Date  . Chronic arm pain   . Chronic pain syndrome   . Decubital ulcer   . Diabetes mellitus   . Heart attack ([place])   . History of right coronary artery stent placement    2 stents placed at separate times  . Hypertension   . Stroke ([place])   . Thyroid disease     SOCIAL HX:  Social History   Tobacco Use  . Smoking status: Current Every Day Smoker    Packs/day: 0.50    Types: Cigarettes  . Smokeless tobacco: Never Used  Substance Use Topics  . Alcohol use: No    ALLERGIES:  Allergies  Allergen Reactions  . Amoxicillin Nausea And Vomiting  . Morphine And Related Nausea And Vomiting and Other (See Comments)    Also passed out  . Percocet [Oxycodone-Acetaminophen] Nausea And Vomiting  . Percodan [Oxycodone-Aspirin] Nausea And Vomiting  . Cymbalta [Duloxetine Hcl] Other (See Comments)    Tremors/uncontrolled movement per son  . Gabapentin Other (See Comments)    Tremors/uncontrolled movements per son  . Lexapro [Escitalopram] Other (See Comments)  . Penicillins Nausea And Vomiting and Swelling    Tongue swelling Has patient had a PCN reaction causing immediate rash, facial/tongue/throat swelling, SOB or lightheadedness with hypotension: Yes Has patient had a PCN reaction causing severe rash involving mucus membranes or skin necrosis: Unknown Has patient had a PCN reaction that required hospitalization: Unknown Has patient had a PCN reaction occurring within the last 10 years: Unknown If all of the above answers are "NO", then may proceed with Cephalosporin use.  Marland Kitchen Percodan [Oxycodone-Aspirin] Nausea And Vomiting  . Torecan [Thiethylperazine] Other (See Comments)  Pt does not recall this medication  . Sulfa Antibiotics Nausea And Vomiting and Rash     PERTINENT MEDICATIONS:  Outpatient Encounter Medications as of 11/13/2017  Medication Sig  . ACCU-CHEK AVIVA PLUS test strip USE TO TEST BLOOD SUGAR UP TO QID  . ACCU-CHEK SOFTCLIX LANCETS lancets TEST UP TO QID  . aspirin EC 81 MG tablet Take 81 mg by mouth daily.   Marland Kitchen atorvastatin (LIPITOR) 80 MG tablet Take 40 mg by mouth every morning.   . Benzocaine (BOIL-EASE EX) Apply 1 application  topically daily as needed (pain from boils).  . benztropine (COGENTIN) 1 MG tablet Take 1 tablet (1 mg total) by mouth 2 (two) times daily. (Patient not taking: Reported on 04/26/2017)  . clopidogrel (PLAVIX) 75 MG tablet Take 75 mg by mouth daily.  . Colloidal Oatmeal (ECZEMA MOISTURIZING EX) Apply 1 application topically daily as needed (for irritation on buttocks).  . cyclobenzaprine (FLEXERIL) 5 MG tablet Take 1 tablet (5 mg total) by mouth 3 (three) times daily as needed for muscle spasms.  Marland Kitchen docusate sodium (COLACE) 100 MG capsule Take 100 mg by mouth every morning.  . ferrous sulfate 220 (44 Fe) MG/5ML solution Take 220 mg by mouth daily.  . ferrous sulfate 325 (65 FE) MG tablet Take 1 tablet (325 mg total) by mouth 2 (two) times daily with a meal.  . gabapentin (NEURONTIN) 100 MG capsule as needed. 50mg  ?  . ibuprofen (ADVIL,MOTRIN) 200 MG tablet Take 100 mg by mouth every 6 (six) hours as needed for headache (pain).  . insulin glargine (LANTUS) 100 UNIT/ML injection Inject 20-30 Units into the skin See admin instructions. 20-30 units two times a day after meals (lunch and dinner)  . Insulin Syringe-Needle U-100 31G X 1/4" 1 ML MISC 1 Dose by Misc.(Non-Drug; Combo Route) route daily. Or as directed to inject insulin under the skin  . levothyroxine (SYNTHROID, LEVOTHROID) 137 MCG tablet Take 137 mcg by mouth daily before breakfast.  . LORazepam (ATIVAN) 0.5 MG tablet Take 0.5 mg by mouth 4 (four) times daily as needed for anxiety.   . OnabotulinumtoxinA (BOTOX IJ) every 3 (three) months. RIGHT ARM  . ondansetron (ZOFRAN-ODT) 4 MG disintegrating tablet Take 4 mg by mouth every 6 (six) hours as needed for nausea or vomiting.   . phenazopyridine (PYRIDIUM) 200 MG tablet Take 1 tablet (200 mg total) by mouth 3 (three) times daily.  . polyethylene glycol (MIRALAX) packet Take 17 g by mouth daily.  Marland Kitchen senna-docusate (DOK PLUS) 8.6-50 MG tablet Take 1 tablet by mouth daily as needed for mild  constipation.  Marland Kitchen Spacer/Aero-Holding Chambers (AEROCHAMBER PLUS WITH MASK) inhaler Use as instructed  . traMADol (ULTRAM) 50 MG tablet Take 1 tablet (50 mg total) by mouth every 6 (six) hours as needed. (Patient not taking: Reported on 04/26/2017)   No facility-administered encounter medications on file as of 11/13/2017.     PHYSICAL EXAM:   General:  Frail appearing, thin Resting in wheelchair Cardiovascular: regular rate and rhythm Pulmonary: clear in all lung fields Breast:  Palpable, movable R lateral breast mass through clothing Abdomen: soft, nontender, + bowel sounds Extremities: no edema, without muscle mass.. Contractures of right hand Skin: Exposed skin is intact Neurological: A&Ox3.  R hemiplegia Psych:  Anxious and quiet mood.Gonzella Lex, NP-C

## 2018-02-28 ENCOUNTER — Other Ambulatory Visit: Payer: Medicaid Other | Admitting: Internal Medicine

## 2018-03-12 ENCOUNTER — Other Ambulatory Visit: Payer: Medicaid Other | Admitting: Licensed Clinical Social Worker

## 2018-03-12 DIAGNOSIS — Z515 Encounter for palliative care: Secondary | ICD-10-CM

## 2018-03-13 NOTE — Progress Notes (Signed)
COMMUNITY PALLIATIVE CARE SW NOTE  PATIENT NAME: Laura Oconnor DOB: 1955-11-09 MRN: [id number]  PRIMARY CARE PROVIDER: Clovia Cuff, MD  RESPONSIBLE PARTY:  Acct ID - Guarantor Home Phone Work Phone Relationship Acct Type  [id number] DEETRA, BOOTON [phone]  Self P/F     [place], [place], [place] [address]     PLAN OF CARE and INTERVENTIONS:             1. GOALS OF CARE/ ADVANCE CARE PLANNING:  Goal is for patient to remain at home with her son, [name].  Patient is a full code. 2. SOCIAL/EMOTIONAL/SPIRITUAL ASSESSMENT/ INTERVENTIONS:  SW met with patient in her home.  [name] was also in the home.  He reported patient will not see a counselor and will not take an antidepressant.  SW provided education regarding the benefits of utilizing both modalities to help with her depression and anxiety.  Provided gentle touch, which she appears to respond positively to.  Patient reports a lump on her left breast and is planning to have a mammogram.  SW provided active listening and supportive counseling when she expressed her concern. 3. PATIENT/CAREGIVER EDUCATION/ COPING:  Patient copes by expressing her needs openly.  She states smoking helps to reduce her anxiety. 4. PERSONAL EMERGENCY PLAN:  Patient will contact her son, or EMS for medical emergencies. 5. COMMUNITY RESOURCES COORDINATION/ HEALTH CARE NAVIGATION:  Patient has a CNA three hours a day for five days per week. 6. FINANCIAL/LEGAL CONCERNS/INTERVENTIONS:  She is on a fixed income.     SOCIAL HX:  Social History   Tobacco Use  . Smoking status: Current Every Day Smoker    Packs/day: 0.50    Types: Cigarettes  . Smokeless tobacco: Never Used  Substance Use Topics  . Alcohol use: No    CODE STATUS:  Full Code  ADVANCED DIRECTIVES: N MOST FORM COMPLETE:  Pending HOSPICE EDUCATION PROVIDED:  N PPS:  Patient reports her appetite is normal.  She is w/c bound. Duration of visit and documentation:  75 minutes.      Creola Corn , LCSW

## 2018-04-03 ENCOUNTER — Other Ambulatory Visit: Payer: Medicaid Other | Admitting: Licensed Clinical Social Worker

## 2018-04-03 DIAGNOSIS — Z515 Encounter for palliative care: Secondary | ICD-10-CM

## 2018-04-04 NOTE — Progress Notes (Signed)
COMMUNITY PALLIATIVE CARE SW NOTE  PATIENT NAME: Laura Oconnor DOB: 1956/02/28 MRN: [id number]  PRIMARY CARE PROVIDER: Clovia Cuff, MD  RESPONSIBLE PARTY:  Acct ID - Guarantor Home Phone Work Phone Relationship Acct Type  [id number] Laura Oconnor, Laura Oconnor [phone]  Self P/F     [place], [place], [place] [address]    PLAN OF CARE and INTERVENTIONS:             1. GOALS OF CARE/ ADVANCE CARE PLANNING:  Patient's goal to remain at home with her son, Laura Oconnor.  Patient is a full code. 2. SOCIAL/EMOTIONAL/SPIRITUAL ASSESSMENT/ INTERVENTIONS:  SW met with patient in her home.  Patient's son, Laura Oconnor, was also present.  Patient was holding her right arm close to her chest because she said it becomes uncomfortable.  She reported being able to take ibuprofen after lunch which relieves symptoms.  Patient reported decreased panic attacks.  She said she became anxious yesterday when she began thinking about her deceased son.  She was able to decrease her anxiety with self talk which was effective.  Her aid continues to provide care for six hours a week, although patient said she feels she needs more care. 3. PATIENT/CAREGIVER EDUCATION/ COPING:  Patient copes by expressing her needs openly.   4. PERSONAL EMERGENCY PLAN:  Patient will contact her son, or EMS for medical emergencies. 5. COMMUNITY RESOURCES COORDINATION/ HEALTH CARE NAVIGATION:  Patient has a CNA. 6. FINANCIAL/LEGAL CONCERNS/INTERVENTIONS:  She is on a fixed income.  SOCIAL HX:  Social History   Tobacco Use  . Smoking status: Current Every Day Smoker    Packs/day: 0.50    Types: Cigarettes  . Smokeless tobacco: Never Used  Substance Use Topics  . Alcohol use: No    CODE STATUS:  Full Code  ADVANCED DIRECTIVES: N MOST FORM COMPLETE:  Pending HOSPICE EDUCATION PROVIDED:  N PPS:  Patient reports her appetite is normal.  She is w/c bound. Duration of visit and documentation:  60 minutes.     Creola Corn Diogenes Whirley, LCSW

## 2018-04-22 ENCOUNTER — Telehealth: Payer: Self-pay

## 2018-04-22 NOTE — Telephone Encounter (Signed)
Spoke with patient to schedule visit with Palliative Care. Visit scheduled for Thursday 04/25/2018

## 2018-04-23 ENCOUNTER — Other Ambulatory Visit: Payer: Medicaid Other | Admitting: Licensed Clinical Social Worker

## 2018-04-23 DIAGNOSIS — Z515 Encounter for palliative care: Secondary | ICD-10-CM

## 2018-04-23 NOTE — Progress Notes (Signed)
COMMUNITY PALLIATIVE CARE SW NOTE  PATIENT NAME: Laura Oconnor DOB: 06/13/1955 MRN: [id number]  PRIMARY CARE PROVIDER: Asenso, [name], MD  RESPONSIBLE PARTY:  Acct ID - Guarantor Home Phone Work Phone Relationship Acct Type  [id number] - Laura Oconnor [phone]  Self P/F     [address], [place], [place] [address]     PLAN OF CARE and INTERVENTIONS:             1. GOALS OF CARE/ ADVANCE CARE PLANNING:  Patient stated her goal is to move into her own apartment.  She is a full code. 2. SOCIAL/EMOTIONAL/SPIRITUAL ASSESSMENT/ INTERVENTIONS:  SW met with patient in her home.  Her CNA, Laura Oconnor, was also present.  Patient was alert and oriented x4.  She denied pain and engaged in conversation.  Her son, Laura Oconnor, is currently in the hospital for high blood pressure per patient.  She expressed being very concerned about him and his stress.  She wants to move into an apartment and states she has care to assist her.  Explored interventions to decrease her anxiety.  Provided active listening and supportive counseling. 3. PATIENT/CAREGIVER EDUCATION/ COPING:  Patient copes by expressing her needs and feelings openly. 4. PERSONAL EMERGENCY PLAN:  Patient contacts her son or EMS. 5. COMMUNITY RESOURCES COORDINATION/ HEALTH CARE NAVIGATION:  Patient has a CNA a few hours a week. 6. FINANCIAL/LEGAL CONCERNS/INTERVENTIONS:  Patient is on a fixed income.     SOCIAL HX:  Social History   Tobacco Use  . Smoking status: Current Every Day Smoker    Packs/day: 0.50    Types: Cigarettes  . Smokeless tobacco: Never Used  Substance Use Topics  . Alcohol use: No    CODE STATUS:  Full code ADVANCED DIRECTIVES: N MOST FORM COMPLETE:  Pending HOSPICE EDUCATION PROVIDED:  N PPS: Patient reports a normal appetite.  She is w/c bound. Duration of visit and documentation:  75 minutes       Z , LCSW 

## 2018-04-25 ENCOUNTER — Other Ambulatory Visit: Payer: Medicaid Other | Admitting: Internal Medicine

## 2018-04-25 ENCOUNTER — Telehealth: Payer: Self-pay | Admitting: Licensed Clinical Social Worker

## 2018-04-25 NOTE — Telephone Encounter (Signed)
SW left a vm with MD requesting an updated FL2 per patient request.  She is wanting nursing home placement per Palliative Care NP, Gonzella Lex.

## 2018-05-24 ENCOUNTER — Other Ambulatory Visit: Payer: Medicaid Other | Admitting: Licensed Clinical Social Worker

## 2018-05-24 DIAGNOSIS — Z515 Encounter for palliative care: Secondary | ICD-10-CM

## 2018-05-27 NOTE — Progress Notes (Signed)
COMMUNITY PALLIATIVE CARE SW NOTE  PATIENT NAME: Laura Oconnor DOB: [birth year] MRN: [id number]  PRIMARY CARE PROVIDER: Clovia Cuff, MD  RESPONSIBLE PARTY:  Acct ID - Guarantor Home Phone Work Phone Relationship Acct Type  [id number] YOSHINO, BROCCOLI [phone]  Self P/F     [place], [place], [place] [address]     PLAN OF CARE and INTERVENTIONS:             1. GOALS OF CARE/ ADVANCE CARE PLANNING:  Patient stated she wanted to move in with her cousin.  She is a full code. 2. SOCIAL/EMOTIONAL/SPIRITUAL ASSESSMENT/ INTERVENTIONS:  SW met with patient in her home.  Her son was in the home, but not present for the visit.  Her CNA was also in the home.  SW provided gentle touch and supportive counseling.  Patient stated she continues to have anxiety and does not wish to go outside.  She asked for her anti-anxiety medication to be increased and informed her that was out of SW scope of practice.  SW encouraged her to continue seeing her DSS SW. 3. PATIENT/CAREGIVER EDUCATION/ COPING:  Patient copes by expressing her feelings and needs openly. 4. PERSONAL EMERGENCY PLAN:  Patient contacts her son or EMS. 5. COMMUNITY RESOURCES COORDINATION/ HEALTH CARE NAVIGATION:  Patient has a CNA a few hours a week. 6. FINANCIAL/LEGAL CONCERNS/INTERVENTIONS:  Patient is on a fixed income.     SOCIAL HX:  Social History   Tobacco Use  . Smoking status: Current Every Day Smoker    Packs/day: 0.50    Types: Cigarettes  . Smokeless tobacco: Never Used  Substance Use Topics  . Alcohol use: No    CODE STATUS:  Full Code  ADVANCED DIRECTIVES: N MOST FORM COMPLETE:  Pending HOSPICE EDUCATION PROVIDED:  N PPS:  Patient reports normal appetite.  She is w/c bound. Duration of visit and documentation:  50 minutes.      Creola Corn Liat Mayol, LCSW

## 2018-07-04 ENCOUNTER — Telehealth: Payer: Self-pay

## 2018-07-04 ENCOUNTER — Other Ambulatory Visit: Payer: Medicaid Other | Admitting: Licensed Clinical Social Worker

## 2018-07-04 ENCOUNTER — Other Ambulatory Visit: Payer: Self-pay

## 2018-07-04 DIAGNOSIS — Z515 Encounter for palliative care: Secondary | ICD-10-CM

## 2018-07-04 NOTE — Telephone Encounter (Signed)
Phone call received from nurse with [facility] who was inquiring if patient was under palliative services and what that included. Discussed Palliative services.

## 2018-07-04 NOTE — Telephone Encounter (Signed)
Received VM from Cleatis Polka with health department. Returned call and left VM with contact info

## 2018-07-04 NOTE — Progress Notes (Signed)
COMMUNITY PALLIATIVE CARE SW NOTE  PATIENT NAME: Laura Oconnor DOB: 05-Feb-1956 MRN: [id number]  PRIMARY CARE PROVIDER: Clovia Cuff, MD  RESPONSIBLE PARTY:  Acct ID - Guarantor Home Phone Work Phone Relationship Acct Type  [id number] TRUDE, CANSLER [phone]  Self P/F     [place], [place], [place] [address]     PLAN OF CARE and INTERVENTIONS:             1. GOALS OF CARE/ ADVANCE CARE PLANNING:  Goal is for patient to remain at home with her son.  She remains a full code. 2. SOCIAL/EMOTIONAL/SPIRITUAL ASSESSMENT/ INTERVENTIONS:  SW met with patient in her home.  Her son was home but not present.  [name], patient's CNA, was in the home.  Patient stated her relationship with her son is stable and she does not plan to move.  She displayed a bright affect and engaged in conversation.  Her mood has improved significantly since first providing counseling for patient.  She has appeared to have adjusted to her physical decline, but requested Hospice for pain medication.  SW provided education regarding Hospice.  Palliative Care SW will no longer be providing services to patient.  Patient stated she understood.  She currently has a Education officer, museum through the department of social services. 3. PATIENT/CAREGIVER EDUCATION/ COPING:  Patient copes by expressing her feelings openly. 4. PERSONAL EMERGENCY PLAN:  Patient will inform her son of medical concerns. 5. COMMUNITY RESOURCES COORDINATION/ HEALTH CARE NAVIGATION:  Patient has a CNA.  She is in the process of obtaining CAP. 6. FINANCIAL/LEGAL CONCERNS/INTERVENTIONS:  Patient is on a fixed income.     SOCIAL HX:  Social History   Tobacco Use  . Smoking status: Current Every Day Smoker    Packs/day: 0.50    Types: Cigarettes  . Smokeless tobacco: Never Used  Substance Use Topics  . Alcohol use: No    CODE STATUS:  Full Code  ADVANCED DIRECTIVES: N MOST FORM COMPLETE:  Pending HOSPICE EDUCATION PROVIDED:  Yes PPS:  Patient reports a  normal appetite.  She is w/c bound. Duration of visit and documentation:  60 minutes.      [name], LCSW

## 2018-10-24 ENCOUNTER — Encounter (HOSPITAL_BASED_OUTPATIENT_CLINIC_OR_DEPARTMENT_OTHER): Payer: Self-pay | Admitting: Emergency Medicine

## 2018-10-24 ENCOUNTER — Emergency Department (HOSPITAL_BASED_OUTPATIENT_CLINIC_OR_DEPARTMENT_OTHER): Payer: Medicaid Other

## 2018-10-24 ENCOUNTER — Emergency Department (HOSPITAL_BASED_OUTPATIENT_CLINIC_OR_DEPARTMENT_OTHER)
Admission: EM | Admit: 2018-10-24 | Discharge: 2018-10-24 | Disposition: A | Discharge status: Home / Self Care | Payer: Medicaid Other | Attending: Emergency Medicine | Admitting: Emergency Medicine

## 2018-10-24 ENCOUNTER — Other Ambulatory Visit: Payer: Self-pay

## 2018-10-24 DIAGNOSIS — Z79899 Other long term (current) drug therapy: Secondary | ICD-10-CM | POA: Insufficient documentation

## 2018-10-24 DIAGNOSIS — F17210 Nicotine dependence, cigarettes, uncomplicated: Secondary | ICD-10-CM | POA: Diagnosis not present

## 2018-10-24 DIAGNOSIS — E119 Type 2 diabetes mellitus without complications: Secondary | ICD-10-CM | POA: Insufficient documentation

## 2018-10-24 DIAGNOSIS — I10 Essential (primary) hypertension: Secondary | ICD-10-CM | POA: Insufficient documentation

## 2018-10-24 DIAGNOSIS — K5900 Constipation, unspecified: Secondary | ICD-10-CM | POA: Insufficient documentation

## 2018-10-24 DIAGNOSIS — Z7901 Long term (current) use of anticoagulants: Secondary | ICD-10-CM | POA: Diagnosis not present

## 2018-10-24 DIAGNOSIS — Z8673 Personal history of transient ischemic attack (TIA), and cerebral infarction without residual deficits: Secondary | ICD-10-CM | POA: Insufficient documentation

## 2018-10-24 DIAGNOSIS — R1031 Right lower quadrant pain: Secondary | ICD-10-CM | POA: Diagnosis present

## 2018-10-24 DIAGNOSIS — R1084 Generalized abdominal pain: Secondary | ICD-10-CM | POA: Diagnosis not present

## 2018-10-24 LAB — CBC WITH DIFFERENTIAL/PLATELET
Abs Immature Granulocytes: 0.01 10*3/uL (ref 0.00–0.07)
Basophils Absolute: 0.1 10*3/uL (ref 0.0–0.1)
Basophils Relative: 1 %
Eosinophils Absolute: 0.5 10*3/uL (ref 0.0–0.5)
Eosinophils Relative: 7 %
HCT: 31.7 % — ABNORMAL LOW (ref 36.0–46.0)
Hemoglobin: 9.6 g/dL — ABNORMAL LOW (ref 12.0–15.0)
Immature Granulocytes: 0 %
Lymphocytes Relative: 28 %
Lymphs Abs: 2 10*3/uL (ref 0.7–4.0)
MCH: 25.1 pg — ABNORMAL LOW (ref 26.0–34.0)
MCHC: 30.3 g/dL (ref 30.0–36.0)
MCV: 83 fL (ref 80.0–100.0)
Monocytes Absolute: 0.9 10*3/uL (ref 0.1–1.0)
Monocytes Relative: 13 %
Neutro Abs: 3.6 10*3/uL (ref 1.7–7.7)
Neutrophils Relative %: 51 %
Platelets: 258 10*3/uL (ref 150–400)
RBC: 3.82 MIL/uL — ABNORMAL LOW (ref 3.87–5.11)
RDW: 15.1 % (ref 11.5–15.5)
WBC: 7 10*3/uL (ref 4.0–10.5)
nRBC: 0 % (ref 0.0–0.2)

## 2018-10-24 LAB — COMPREHENSIVE METABOLIC PANEL
ALT: 11 U/L (ref 0–44)
AST: 14 U/L — ABNORMAL LOW (ref 15–41)
Albumin: 3.7 g/dL (ref 3.5–5.0)
Alkaline Phosphatase: 105 U/L (ref 38–126)
Anion gap: 8 (ref 5–15)
BUN: 11 mg/dL (ref 8–23)
CO2: 25 mmol/L (ref 22–32)
Calcium: 8.9 mg/dL (ref 8.9–10.3)
Chloride: 103 mmol/L (ref 98–111)
Creatinine, Ser: 0.55 mg/dL (ref 0.44–1.00)
GFR calc Af Amer: >60 mL/min (ref >60)
GFR calc non Af Amer: >60 mL/min (ref >60)
Glucose, Bld: 220 mg/dL — ABNORMAL HIGH (ref 70–99)
Potassium: 3.4 mmol/L — ABNORMAL LOW (ref 3.5–5.1)
Sodium: 136 mmol/L (ref 135–145)
Total Bilirubin: 0.5 mg/dL (ref 0.3–1.2)
Total Protein: 7 g/dL (ref 6.5–8.1)

## 2018-10-24 LAB — URINALYSIS, ROUTINE W REFLEX MICROSCOPIC
Bilirubin Urine: NEGATIVE
Glucose, UA: 100 mg/dL — AB
Hgb urine dipstick: NEGATIVE
Ketones, ur: NEGATIVE mg/dL
Leukocytes,Ua: NEGATIVE
Nitrite: NEGATIVE
Protein, ur: NEGATIVE mg/dL
Specific Gravity, Urine: <1.005 — ABNORMAL LOW (ref 1.005–1.030)
pH: 7 (ref 5.0–8.0)

## 2018-10-24 LAB — LIPASE, BLOOD: Lipase: 27 U/L (ref 11–51)

## 2018-10-24 LAB — TSH: TSH: 0.076 u[IU]/mL — ABNORMAL LOW (ref 0.350–4.500)

## 2018-10-24 MED ORDER — FENTANYL CITRATE (PF) 100 MCG/2ML IJ SOLN
25.0000 ug | Freq: Once | INTRAMUSCULAR | Status: AC
  Administered 2018-10-24: 25 ug via INTRAVENOUS
  Filled 2018-10-24: qty 2

## 2018-10-24 MED ORDER — POLYETHYLENE GLYCOL 3350 17 G PO PACK
17.0000 g | PACK | Freq: Every day | ORAL | Status: DC
  Filled 2018-10-24: qty 1

## 2018-10-24 MED ORDER — SODIUM CHLORIDE 0.9 % IV BOLUS
1000.0000 mL | Freq: Once | INTRAVENOUS | Status: AC
  Administered 2018-10-24: 09:00:00 1000 mL via INTRAVENOUS

## 2018-10-24 MED ORDER — METAMUCIL 0.52 G PO CAPS: 0.5200 g | ORAL_CAPSULE | Freq: Every day | ORAL | 0 refills | Status: AC

## 2018-10-24 MED ORDER — ONDANSETRON HCL 4 MG/2ML IJ SOLN
4.0000 mg | Freq: Once | INTRAMUSCULAR | Status: DC
  Filled 2018-10-24: qty 2

## 2018-10-24 MED ORDER — FLEET ENEMA 7-19 GM/118ML RE ENEM
1.0000 | ENEMA | Freq: Once | RECTAL | Status: AC
  Administered 2018-10-24: 1 via RECTAL
  Filled 2018-10-24: qty 1

## 2018-10-24 MED ORDER — MAGNESIUM CITRATE PO SOLN
1.0000 | Freq: Once | ORAL | Status: AC
  Administered 2018-10-24: 10:00:00 1 via ORAL
  Filled 2018-10-24: qty 296

## 2018-10-24 MED ORDER — SORBITOL 70 % SOLN
960.0000 mL | TOPICAL_OIL | Freq: Once | ORAL | Status: DC
  Filled 2018-10-24: qty 240

## 2018-10-24 NOTE — ED Triage Notes (Signed)
Right sided abdominal pain that started this morning. Denies n/v/d.

## 2018-10-24 NOTE — ED Notes (Signed)
ED Provider at bedside. 

## 2018-10-24 NOTE — ED Provider Notes (Signed)
[facility] EMERGENCY DEPARTMENT Provider Note   CSN: [id number] Arrival date & time: 10/24/18  0741    History   Chief Complaint Chief Complaint  Patient presents with  . Abdominal Pain    HPI Laura Oconnor is a 63 y.o. female.     Pt presents to the ED today with abdominal pain.  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  She lives with her son and is wheelchair/bedbound.  She woke up this morning with right lower abdominal pain.  She said her bp was low at home (100).  She denies f/c.  No n/v.  Pt last had a bowel movement on Sunday, July 5.  She usually gets an enema once a week.  Pt has a healing decub on her right buttock that is still hurting.  She is here with a caregiver who is with her all the time.     Past Medical History:  Diagnosis Date  . Chronic arm pain   . Chronic pain syndrome   . Decubital ulcer   . Diabetes mellitus   . Heart attack ([place])   . History of right coronary artery stent placement    2 stents placed at separate times  . Hypertension   . Stroke ([place])   . Thyroid disease     Patient Active Problem List   Diagnosis Date Noted  . Palliative care encounter 12/05/2017  . Weakness 12/05/2017  . Acute on chronic anemia 04/26/2017  . TIA (transient ischemic attack) 02/22/2017  . Right hip pain 02/09/2017  . Right shoulder pain 01/13/2017  . History of CVA with residual deficit 10/16/2016  . Weakness as late effect of cerebrovascular accident (CVA) 08/09/2016  . Depression with anxiety 08/04/2016  . Hemiplegia affecting right dominant side ([place]) 05/31/2016  . Hypokalemia 08/13/2014  . Diarrhea 08/13/2014  . Loss of weight 08/13/2014  . Hypocalcemia 08/13/2014  . Atrophic vaginitis 08/13/2014  . Type 2 diabetes mellitus, uncontrolled ([place]) 08/13/2014  . Breast cancer ([place]) 08/13/2014  . Hypothyroidism 08/13/2014  . CAD (coronary artery disease) 08/13/2014    Past Surgical History:  Procedure  Laterality Date  . BLADDER SUSPENSION    . LIPOMA EXCISION       OB History   No obstetric history on file.      Home Medications    Prior to Admission medications   Medication Sig Start Date End Date Taking? Authorizing Provider  ACCU-CHEK AVIVA PLUS test strip USE TO TEST BLOOD SUGAR UP TO QID 02/19/17   [provider]  ACCU-CHEK SOFTCLIX LANCETS lancets TEST UP TO QID 02/19/17   [provider]  aspirin EC 81 MG tablet Take 81 mg by mouth daily.     [provider]  atorvastatin (LIPITOR) 80 MG tablet Take 40 mg by mouth every morning.     [provider]  Benzocaine (BOIL-EASE EX) Apply 1 application topically daily as needed (pain from boils).    [provider]  benztropine (COGENTIN) 1 MG tablet Take 1 tablet (1 mg total) by mouth 2 (two) times daily. Patient not taking: Reported on 04/26/2017 12/31/16   Tanna Furry, MD  clopidogrel (PLAVIX) 75 MG tablet Take 75 mg by mouth daily.    [provider]  Colloidal Oatmeal (ECZEMA MOISTURIZING EX) Apply 1 application topically daily as needed (for irritation on buttocks).    [provider]  cyclobenzaprine (FLEXERIL) 5 MG tablet Take 1 tablet (5 mg  total) by mouth 3 (three) times daily as needed for muscle spasms. 09/24/17   Horton, Barbette Hair, MD  docusate sodium (COLACE) 100 MG capsule Take 100 mg by mouth every morning.    [provider]  ferrous sulfate 220 (44 Fe) MG/5ML solution Take 220 mg by mouth daily.    [provider]  ferrous sulfate 325 (65 FE) MG tablet Take 1 tablet (325 mg total) by mouth 2 (two) times daily with a meal. 08/14/14   Thurnell Lose, MD  gabapentin (NEURONTIN) 100 MG capsule as needed. 50mg  ?    [provider]  ibuprofen (ADVIL,MOTRIN) 200 MG tablet Take 100 mg by mouth every 6 (six) hours as needed for headache (pain).    [provider]  insulin glargine (LANTUS) 100 UNIT/ML injection Inject 20-30 Units  into the skin See admin instructions. 20-30 units two times a day after meals (lunch and dinner) 11/17/16   [provider]  Insulin Syringe-Needle U-100 31G X 1/4" 1 ML MISC 1 Dose by Misc.(Non-Drug; Combo Route) route daily. Or as directed to inject insulin under the skin 11/17/16   [provider]  levothyroxine (SYNTHROID, LEVOTHROID) 137 MCG tablet Take 137 mcg by mouth daily before breakfast.    [provider]  LORazepam (ATIVAN) 0.5 MG tablet Take 0.5 mg by mouth 4 (four) times daily as needed for anxiety.  12/07/16   [provider]  OnabotulinumtoxinA (BOTOX IJ) every 3 (three) months. RIGHT ARM 03/19/17   [provider]  ondansetron (ZOFRAN-ODT) 4 MG disintegrating tablet Take 4 mg by mouth every 6 (six) hours as needed for nausea or vomiting.  12/22/16   [provider]  phenazopyridine (PYRIDIUM) 200 MG tablet Take 1 tablet (200 mg total) by mouth 3 (three) times daily. 08/23/17   Carlisle Cater, PA-C  polyethylene glycol (MIRALAX) packet Take 17 g by mouth daily. 09/25/16   Long, Wonda Olds, MD  psyllium (METAMUCIL) 0.52 g capsule Take 1 capsule (0.52 g total) by mouth daily. 10/24/18   Isla Pence, MD  senna-docusate (DOK PLUS) 8.6-50 MG tablet Take 1 tablet by mouth daily as needed for mild constipation.    [provider]  Spacer/Aero-Holding Chambers (AEROCHAMBER PLUS WITH MASK) inhaler Use as instructed 04/22/12   Riki Altes, MD  traMADol (ULTRAM) 50 MG tablet Take 1 tablet (50 mg total) by mouth every 6 (six) hours as needed. Patient not taking: Reported on 04/26/2017 11/19/16   Veryl Speak, MD    Family History Family History  Problem Relation Age of Onset  . Diabetes Father   . Diabetes Sister   . Diabetes Brother     Social History Social History   Tobacco Use  . Smoking status: Current Every Day Smoker    Packs/day: 0.50    Types: Cigarettes  . Smokeless tobacco: Never Used  Substance Use Topics  . Alcohol  use: No  . Drug use: No     Allergies   Amoxicillin, Morphine and related, Percocet [oxycodone-acetaminophen], Percodan [oxycodone-aspirin], Acetaminophen, Cymbalta [duloxetine hcl], Gabapentin, Lexapro [escitalopram], Penicillins, Percodan [oxycodone-aspirin], Torecan [thiethylperazine], and Sulfa antibiotics   Review of Systems Review of Systems  Gastrointestinal: Positive for abdominal pain.  Skin: Positive for wound.  All other systems reviewed and are negative.    Physical Exam Updated Vital Signs BP (!) 125/58 (BP Location: Left Arm)   Pulse 91   Temp 98 F (36.7 C) (Oral)   Resp 16   Ht 4\' 11"  (1.499 m)  Wt 47.6 kg   SpO2 100%   BMI 21.21 kg/m   Physical Exam Vitals signs and nursing note reviewed.  Constitutional:      Appearance: She is well-developed.  HENT:     Head: Normocephalic and atraumatic.     Mouth/Throat:     Mouth: Mucous membranes are moist.     Pharynx: Oropharynx is clear.  Eyes:     Extraocular Movements: Extraocular movements intact.     Pupils: Pupils are equal, round, and reactive to light.  Cardiovascular:     Rate and Rhythm: Normal rate and regular rhythm.  Pulmonary:     Effort: Pulmonary effort is normal.     Breath sounds: Normal breath sounds.  Abdominal:     General: Abdomen is flat and scaphoid. There is distension.     Palpations: Abdomen is soft.     Tenderness: There is generalized abdominal tenderness.  Skin:    General: Skin is warm.     Capillary Refill: Capillary refill takes less than 2 seconds.     Comments: Right buttock decub.  Healing well.  Neurological:     Mental Status: She is alert and oriented to person, place, and time.     Comments: Right sided hemiparesis (chronic)      ED Treatments / Results  Labs (all labs ordered are listed, but only abnormal results are displayed) Labs Reviewed  CBC WITH DIFFERENTIAL/PLATELET - Abnormal; Notable for the following components:      Result Value   RBC 3.82  (*)    Hemoglobin 9.6 (*)    HCT 31.7 (*)    MCH 25.1 (*)    All other components within normal limits  COMPREHENSIVE METABOLIC PANEL - Abnormal; Notable for the following components:   Potassium 3.4 (*)    Glucose, Bld 220 (*)    AST 14 (*)    All other components within normal limits  URINALYSIS, ROUTINE W REFLEX MICROSCOPIC - Abnormal; Notable for the following components:   Specific Gravity, Urine <1.005 (*)    Glucose, UA 100 (*)    All other components within normal limits  LIPASE, BLOOD  HEMOGLOBIN A1C  TSH    EKG None  Radiology Dg Abdomen Acute W/chest  Result Date: 10/24/2018 CLINICAL DATA:  Abdominal pain EXAM: DG ABDOMEN ACUTE W/ 1V CHEST COMPARISON:  Abdomen series November 08, 2016 FINDINGS: PA chest: There is no edema or consolidation. Heart size and pulmonary vascularity are normal. No adenopathy. Supine and upright abdomen: There is diffuse stool throughout most of the colon. There is no bowel dilatation or air-fluid level to suggest bowel obstruction. No free air. There are probable small phleboliths in the left pelvis. IMPRESSION: Diffuse stool throughout most of colon. No bowel obstruction or free air. Lungs clear. Electronically Signed   By: Lowella Grip III M.D.   On: 10/24/2018 08:47    Procedures Procedures (including critical care time)  Medications Ordered in ED Medications  ondansetron (ZOFRAN) injection 4 mg (0 mg Intravenous Hold 10/24/18 0846)  sorbitol, milk of mag, mineral oil, glycerin (SMOG) enema (960 mLs Rectal Not Given 10/24/18 0942)  polyethylene glycol (MIRALAX / GLYCOLAX) packet 17 g (17 g Oral Not Given 10/24/18 0942)  sodium chloride 0.9 % bolus 1,000 mL (0 mLs Intravenous Stopped 10/24/18 1010)  fentaNYL (SUBLIMAZE) injection 25 mcg (25 mcg Intravenous Given 10/24/18 0847)  sodium phosphate (FLEET) 7-19 GM/118ML enema 1 enema (1 enema Rectal Given 10/24/18 1011)  magnesium citrate solution 1 Bottle (1  Bottle Oral Given 10/24/18 1011)      Initial Impression / Assessment and Plan / ED Course  I have reviewed the triage vital signs and the nursing notes.  Pertinent labs & imaging results that were available during my care of the patient were reviewed by me and considered in my medical decision making (see chart for details).       Pt is feeling much better after an enema which produced a large bm.  Pt is encouraged to eat more fiber.  She is to return if worse.    Final Clinical Impressions(s) / ED Diagnoses   Final diagnoses:  Generalized abdominal pain  Constipation, unspecified constipation type    ED Discharge Orders         Ordered    psyllium (METAMUCIL) 0.52 g capsule  Daily     10/24/18 1137           Isla Pence, MD 10/24/18 1138

## 2018-10-24 NOTE — ED Notes (Signed)
Patient transported to X-ray 

## 2018-10-25 LAB — HEMOGLOBIN A1C
Hgb A1c MFr Bld: 8.3 % — ABNORMAL HIGH (ref 4.8–5.6)
Mean Plasma Glucose: 192 mg/dL

## 2018-12-01 ENCOUNTER — Encounter (HOSPITAL_BASED_OUTPATIENT_CLINIC_OR_DEPARTMENT_OTHER): Payer: Self-pay | Admitting: *Deleted

## 2018-12-01 ENCOUNTER — Other Ambulatory Visit: Payer: Self-pay

## 2018-12-01 ENCOUNTER — Emergency Department (HOSPITAL_BASED_OUTPATIENT_CLINIC_OR_DEPARTMENT_OTHER)
Admission: EM | Admit: 2018-12-01 | Discharge: 2018-12-01 | Disposition: A | Discharge status: Home / Self Care | Payer: Medicare Other | Attending: Emergency Medicine | Admitting: Emergency Medicine

## 2018-12-01 DIAGNOSIS — I259 Chronic ischemic heart disease, unspecified: Secondary | ICD-10-CM | POA: Insufficient documentation

## 2018-12-01 DIAGNOSIS — T6594XA Toxic effect of unspecified substance, undetermined, initial encounter: Secondary | ICD-10-CM | POA: Insufficient documentation

## 2018-12-01 DIAGNOSIS — Z79899 Other long term (current) drug therapy: Secondary | ICD-10-CM | POA: Insufficient documentation

## 2018-12-01 DIAGNOSIS — Z794 Long term (current) use of insulin: Secondary | ICD-10-CM | POA: Diagnosis not present

## 2018-12-01 DIAGNOSIS — Z7982 Long term (current) use of aspirin: Secondary | ICD-10-CM | POA: Diagnosis not present

## 2018-12-01 DIAGNOSIS — E039 Hypothyroidism, unspecified: Secondary | ICD-10-CM | POA: Insufficient documentation

## 2018-12-01 DIAGNOSIS — Z7902 Long term (current) use of antithrombotics/antiplatelets: Secondary | ICD-10-CM | POA: Diagnosis not present

## 2018-12-01 DIAGNOSIS — E119 Type 2 diabetes mellitus without complications: Secondary | ICD-10-CM | POA: Diagnosis not present

## 2018-12-01 DIAGNOSIS — I10 Essential (primary) hypertension: Secondary | ICD-10-CM | POA: Diagnosis not present

## 2018-12-01 DIAGNOSIS — T2692XA Corrosion of left eye and adnexa, part unspecified, initial encounter: Secondary | ICD-10-CM

## 2018-12-01 DIAGNOSIS — F17210 Nicotine dependence, cigarettes, uncomplicated: Secondary | ICD-10-CM | POA: Insufficient documentation

## 2018-12-01 MED ORDER — TETRACAINE HCL 0.5 % OP SOLN
OPHTHALMIC | Status: AC
  Administered 2018-12-01: 2 [drp] via OPHTHALMIC
  Filled 2018-12-01: qty 4

## 2018-12-01 MED ORDER — TETRACAINE HCL 0.5 % OP SOLN
2.0000 [drp] | Freq: Once | OPHTHALMIC | Status: AC
  Administered 2018-12-01: 2 [drp] via OPHTHALMIC

## 2018-12-01 MED ORDER — FLUORESCEIN SODIUM 1 MG OP STRP
1.0000 | ORAL_STRIP | Freq: Once | OPHTHALMIC | Status: AC
  Administered 2018-12-01: 1 via OPHTHALMIC

## 2018-12-01 MED ORDER — FLUORESCEIN SODIUM 1 MG OP STRP
ORAL_STRIP | OPHTHALMIC | Status: AC
  Administered 2018-12-01: 1 via OPHTHALMIC
  Filled 2018-12-01: qty 1
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
## 2018-12-01 NOTE — ED Notes (Signed)
Pt and patient caregiver verbalized understanding of dc material. NAD Noted. All questions answered to satisfaction. Pt and caregiver escorted to check out counter.

## 2018-12-01 NOTE — ED Notes (Signed)
RN went into room to place supplies at bedside. Pt was in her personal wheelchair stating that she is going home. RN stated that he would need to tell the MD.

## 2018-12-01 NOTE — ED Notes (Signed)
Spoke with Janett Billow from [employer]. Irrigate eye with NS or LR x 10 mins. Attempt to get pH equal to 7. Wait 30 minutes after irrigation to check pH. Eval for abrasion or injury after ph close to or equal to 7 and treat per protocol
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
## 2018-12-01 NOTE — ED Notes (Signed)
ED Provider at bedside. 

## 2018-12-01 NOTE — ED Notes (Signed)
Dr. Sherry Ruffing made aware of Poison Control recommendations

## 2018-12-01 NOTE — ED Provider Notes (Signed)
[facility] Provider Note   CSN: [id number] Arrival date & time: 12/01/18  2054     History   Chief Complaint Chief Complaint  Patient presents with   Eye Problem    (perm solution in eye)    HPI Laura Oconnor is a 63 y.o. female.     The history is provided by the patient, a caregiver and medical records. No language interpreter was used.  Eye Pain This is a new problem. The current episode started 6 to 12 hours ago. The problem occurs constantly. The problem has been resolved. Pertinent negatives include no chest pain, no abdominal pain, no headaches and no shortness of breath. Nothing aggravates the symptoms. Relieved by: washing eye with water. She has tried nothing for the symptoms. The treatment provided no relief.    Past Medical History:  Diagnosis Date   Chronic arm pain    Chronic pain syndrome    Decubital ulcer    Diabetes mellitus    Heart attack [facility])    History of right coronary artery stent placement    2 stents placed at separate times   Hypertension    Stroke [facility])    Thyroid disease     Patient Active Problem List   Diagnosis Date Noted   Palliative care encounter 12/05/2017   Weakness 12/05/2017   Acute on chronic anemia 04/26/2017   TIA (transient ischemic attack) 02/22/2017   Right hip pain 02/09/2017   Right shoulder pain 01/13/2017   History of CVA with residual deficit 10/16/2016   Weakness as late effect of cerebrovascular accident (CVA) 08/09/2016   Depression with anxiety 08/04/2016   Hemiplegia affecting right dominant side ([place]) 05/31/2016   Hypokalemia 08/13/2014   Diarrhea 08/13/2014   Loss of weight 08/13/2014   Hypocalcemia 08/13/2014   Atrophic vaginitis 08/13/2014   Type 2 diabetes mellitus, uncontrolled ([place]) 08/13/2014   Breast cancer ([place]) 08/13/2014   Hypothyroidism 08/13/2014   CAD (coronary artery disease) 08/13/2014    Past Surgical History:  Procedure  Laterality Date   BLADDER SUSPENSION     LIPOMA EXCISION       OB History   No obstetric history on file.      Home Medications    Prior to Admission medications   Medication Sig Start Date End Date Taking? Authorizing Provider  ACCU-CHEK AVIVA PLUS test strip USE TO TEST BLOOD SUGAR UP TO QID 02/19/17   [provider]  ACCU-CHEK SOFTCLIX LANCETS lancets TEST UP TO QID 02/19/17   [provider]  aspirin EC 81 MG tablet Take 81 mg by mouth daily.     [provider]  atorvastatin (LIPITOR) 80 MG tablet Take 40 mg by mouth every morning.     [provider]  Benzocaine (BOIL-EASE EX) Apply 1 application topically daily as needed (pain from boils).    [provider]  benztropine (COGENTIN) 1 MG tablet Take 1 tablet (1 mg total) by mouth 2 (two) times daily. Patient not taking: Reported on 04/26/2017 12/31/16   Tanna Furry, MD  clopidogrel (PLAVIX) 75 MG tablet Take 75 mg by mouth daily.    [provider]  Colloidal Oatmeal (ECZEMA MOISTURIZING EX) Apply 1 application topically daily as needed (for irritation on buttocks).    [provider]  cyclobenzaprine (FLEXERIL) 5 MG tablet Take 1 tablet (5 mg total) by mouth 3 (three) times daily as needed for muscle spasms. 09/24/17   Horton, Barbette Hair, MD  docusate  sodium (COLACE) 100 MG capsule Take 100 mg by mouth every morning.    [provider]  ferrous sulfate 220 (44 Fe) MG/5ML solution Take 220 mg by mouth daily.    [provider]  ferrous sulfate 325 (65 FE) MG tablet Take 1 tablet (325 mg total) by mouth 2 (two) times daily with a meal. 08/14/14   Thurnell Lose, MD  gabapentin (NEURONTIN) 100 MG capsule as needed. 50mg  ?    [provider]  ibuprofen (ADVIL,MOTRIN) 200 MG tablet Take 100 mg by mouth every 6 (six) hours as needed for headache (pain).    [provider]  insulin glargine (LANTUS) 100 UNIT/ML injection Inject 20-30 Units  into the skin See admin instructions. 20-30 units two times a day after meals (lunch and dinner) 11/17/16   [provider]  Insulin Syringe-Needle U-100 31G X 1/4" 1 ML MISC 1 Dose by Misc.(Non-Drug; Combo Route) route daily. Or as directed to inject insulin under the skin 11/17/16   [provider]  levothyroxine (SYNTHROID, LEVOTHROID) 137 MCG tablet Take 137 mcg by mouth daily before breakfast.    [provider]  LORazepam (ATIVAN) 0.5 MG tablet Take 0.5 mg by mouth 4 (four) times daily as needed for anxiety.  12/07/16   [provider]  OnabotulinumtoxinA (BOTOX IJ) every 3 (three) months. RIGHT ARM 03/19/17   [provider]  ondansetron (ZOFRAN-ODT) 4 MG disintegrating tablet Take 4 mg by mouth every 6 (six) hours as needed for nausea or vomiting.  12/22/16   [provider]  phenazopyridine (PYRIDIUM) 200 MG tablet Take 1 tablet (200 mg total) by mouth 3 (three) times daily. 08/23/17   Carlisle Cater, PA-C  polyethylene glycol (MIRALAX) packet Take 17 g by mouth daily. 09/25/16   Long, Wonda Olds, MD  psyllium (METAMUCIL) 0.52 g capsule Take 1 capsule (0.52 g total) by mouth daily. 10/24/18   Isla Pence, MD  senna-docusate (DOK PLUS) 8.6-50 MG tablet Take 1 tablet by mouth daily as needed for mild constipation.    [provider]  Spacer/Aero-Holding Chambers (AEROCHAMBER PLUS WITH MASK) inhaler Use as instructed 04/22/12   Riki Altes, MD  traMADol (ULTRAM) 50 MG tablet Take 1 tablet (50 mg total) by mouth every 6 (six) hours as needed. Patient not taking: Reported on 04/26/2017 11/19/16   Veryl Speak, MD    Family History Family History  Problem Relation Age of Onset   Diabetes Father    Diabetes Sister    Diabetes Brother     Social History Social History   Tobacco Use   Smoking status: Current Every Day Smoker    Packs/day: 0.50    Types: Cigarettes   Smokeless tobacco: Never Used  Substance Use Topics   Alcohol  use: No   Drug use: No     Allergies   Amoxicillin, Morphine and related, Percocet [oxycodone-acetaminophen], Percodan [oxycodone-aspirin], Acetaminophen, Cymbalta [duloxetine hcl], Gabapentin, Lexapro [escitalopram], Penicillins, Percodan [oxycodone-aspirin], Torecan [thiethylperazine], and Sulfa antibiotics   Review of Systems Review of Systems  Constitutional: Negative for fatigue.  HENT: Negative for congestion.   Eyes: Positive for pain and redness. Negative for photophobia and visual disturbance (resolved).  Respiratory: Negative for cough and shortness of breath.   Cardiovascular: Negative for chest pain.  Gastrointestinal: Negative for abdominal pain.  Genitourinary: Negative for flank pain.  Musculoskeletal: Negative for back pain.  Neurological: Negative for dizziness, facial asymmetry, light-headedness and headaches.  Psychiatric/Behavioral: Negative for agitation.  All other systems reviewed  and are negative.    Physical Exam Updated Vital Signs BP (!) 160/65 (BP Location: Left Arm)    Pulse 97    Temp 99 F (37.2 C) (Oral)    Resp 18    Ht 4\' 11"  (1.499 m)    Wt 47.6 kg    SpO2 100%    BMI 21.21 kg/m   Physical Exam Eyes:     General:        Right eye: No foreign body.        Left eye: No foreign body.     Extraocular Movements: Extraocular movements intact.     Right eye: Normal extraocular motion and no nystagmus.     Left eye: Normal extraocular motion and no nystagmus.     Conjunctiva/sclera:     Right eye: Right conjunctiva is not injected.     Left eye: Left conjunctiva is injected.     Comments: Left eyelid slightly edematous.  Injected conjunctiva on left.  Normal full exam.  Normal floor seen eye exam with Woods lamp.  Pressures not checked as patient refused.  Patient refused further washing.  Exam otherwise unremarkable.      ED Treatments / Results  Labs (all labs ordered are listed, but only abnormal results are displayed) Labs Reviewed -  No data to display  EKG None  Radiology No results found.  Procedures Procedures (including critical care time)  Medications Ordered in ED Medications  tetracaine (PONTOCAINE) 0.5 % ophthalmic solution 2 drop (2 drops Left Eye Given 12/01/18 2334)  fluorescein ophthalmic strip 1 strip (1 strip Left Eye Given 12/01/18 2334)     Initial Impression / Assessment and Plan / ED Course  I have reviewed the triage vital signs and the nursing notes.  Pertinent labs & imaging results that were available during my care of the patient were reviewed by me and considered in my medical decision making (see chart for details).        Laura Oconnor is a 63 y.o. female with a past medical history significant for hypothyroidism, CAD, diabetes, depression, and right-sided weakness who presents for eye injury.  Patient reports that she was getting her hair chemically straightened with a perm today when some of the chemical splashed into her left eye.  She reports onset of pain and burning.  She initially had some blurry vision and called poison control.  They requested she do flushes of her eye with water which she did at home.  She reports doing 3 flushes.  After the burning continued, they told her to come to the emergency department.  On arrival, patient reports her eye pain is doing much better and is nearly resolved.  She reports no blurry vision.  She reports some swelling around her eye that has improved.  She denies headache, nausea, vomiting, or other complaints whatsoever.  Initially when I saw the patient, she wanted to leave the emergency department without evaluation or work-up however patient was convinced to have some evaluation.  A floor seen evaluation was performed with a Woods lamp showing no evidence of corneal abrasion or corneal injury.  Patient's eye was examined with normal extraocular movements, no consensual photophobia, normal pupil exam, and no significant tenderness around the  eye.  A pH strip was utilized and her pH was around 7.  Poison control was called who reports that the chemical straightener is likely basic in nature and the patient's fluoresein test was not basic on my exam.  Patient  was offered further flushes and washes however she did not want to do this.  Poison control did not recommend other drops or chemicals aside from washing with water.  Patient will need to follow-up with ophthalmology for further evaluation and management however her work-up here is overall reassuring and her symptoms are subjectively improving.  Patient did not want to stay and was discharged in good condition with improving symptoms.   Final Clinical Impressions(s) / ED Diagnoses   Final diagnoses:  Chemical injury of left eye, initial encounter    ED Discharge Orders    None     Clinical Impression: 1. Chemical injury of left eye, initial encounter     Disposition: Discharge  Condition: Good  I have discussed the results, Dx and Tx plan with the pt(& family if present). He/she/they expressed understanding and agree(s) with the plan. Discharge instructions discussed at great length. Strict return precautions discussed and pt &/or family have verbalized understanding of the instructions. No further questions at time of discharge.    Discharge Medication List as of 12/01/2018 11:29 PM      Follow Up: Danice Goltz, MD [place] [id number] [phone]     [place] [address] [id number] PZ WCHE [place] [phone]       Jaivyn Gulla, Gwenyth Allegra, MD 12/02/18 [phone]

## 2018-12-03 ENCOUNTER — Telehealth: Payer: Self-pay

## 2018-12-03 NOTE — Telephone Encounter (Signed)
Phone call placed to patient to check in and to offer to schedule visit with Palliative Care. Patient receptive to visit. Scheduled for 12/09/2018 @ 12noon

## 2018-12-09 ENCOUNTER — Other Ambulatory Visit: Payer: Medicare Other | Admitting: Internal Medicine

## 2018-12-09 ENCOUNTER — Other Ambulatory Visit: Payer: Self-pay

## 2018-12-19 ENCOUNTER — Telehealth: Payer: Self-pay | Admitting: Licensed Clinical Social Worker

## 2018-12-19 NOTE — Telephone Encounter (Signed)
Palliative Care SW returned patient's phone call.  Left a VM.

## 2018-12-22 IMAGING — CT CT RENAL STONE PROTOCOL
2 of 4 series · 17 of 46 positions shown, 19 images · non-contrast
Comparison: 07/25/2016

CLINICAL DATA: Left lateral abdominal pain onset today. Urinary
frequency. Prior melena.

EXAM:
CT ABDOMEN AND PELVIS WITHOUT CONTRAST
TECHNIQUE: Multidetector CT imaging of the abdomen and pelvis was performed
following the standard protocol without IV contrast.

[Series 2: axial st · axial · 0.78mm/px · z∈[-348,+52]mm · 14 of 88 slices shown, 16 images]
[im 4/88  soft-tissue]
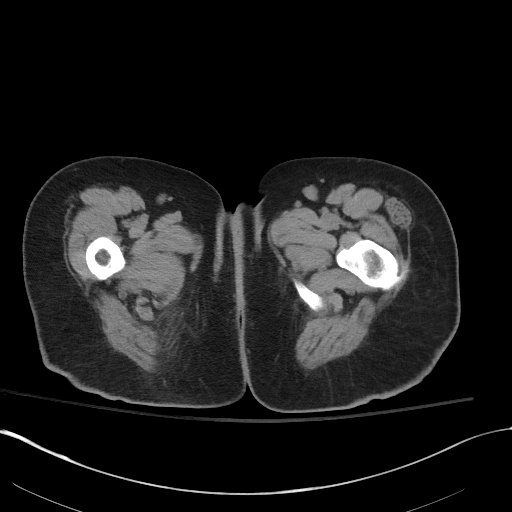
[im 4/88  bone]
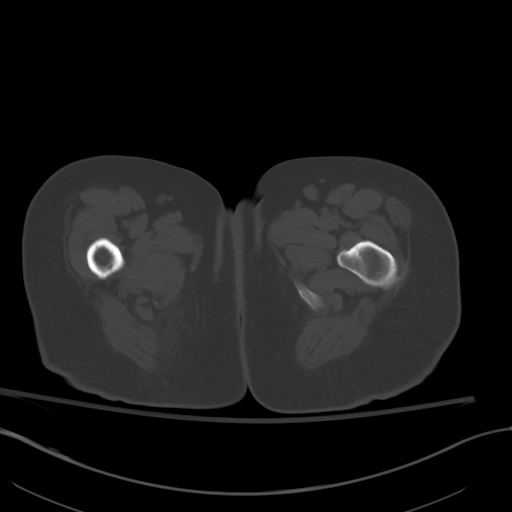
[im 11/88  soft-tissue]
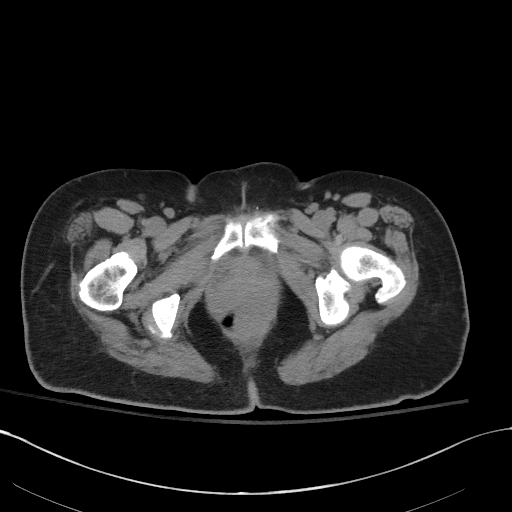
[im 18/88  soft-tissue]
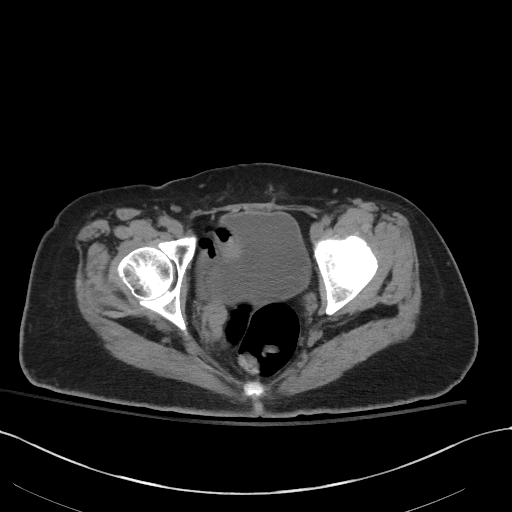
[im 25/88  soft-tissue]
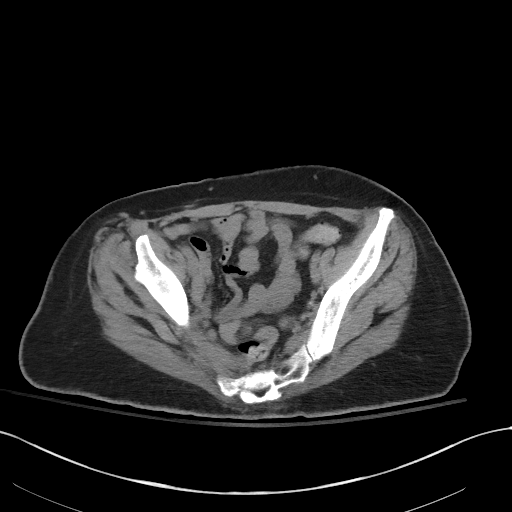
[im 28/88  soft-tissue]
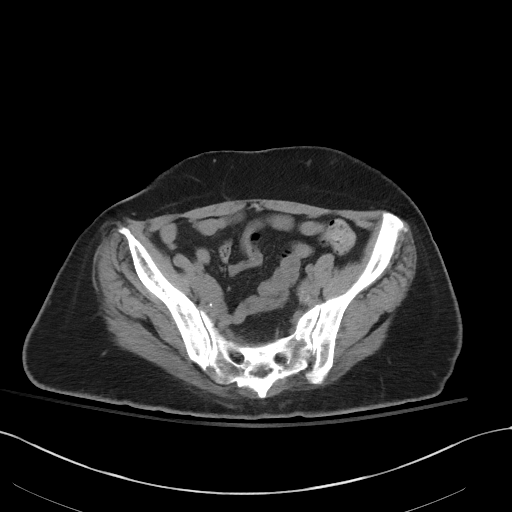
[im 35/88  soft-tissue]
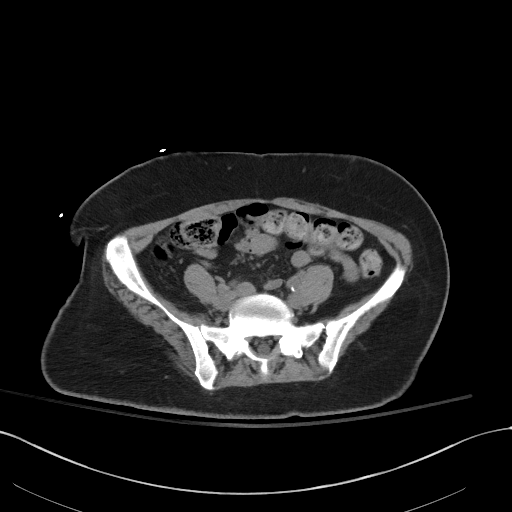
[im 42/88  soft-tissue]
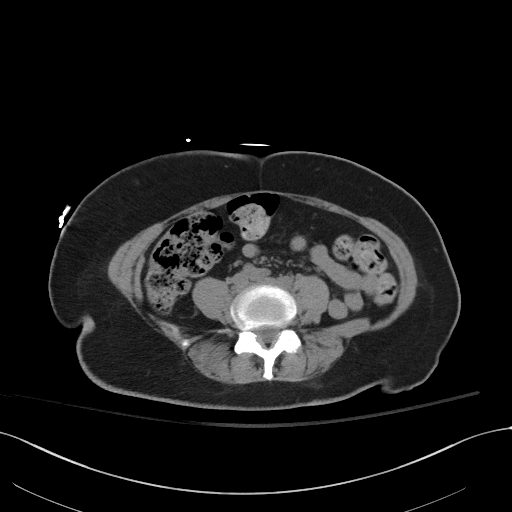
[im 46/88  soft-tissue]
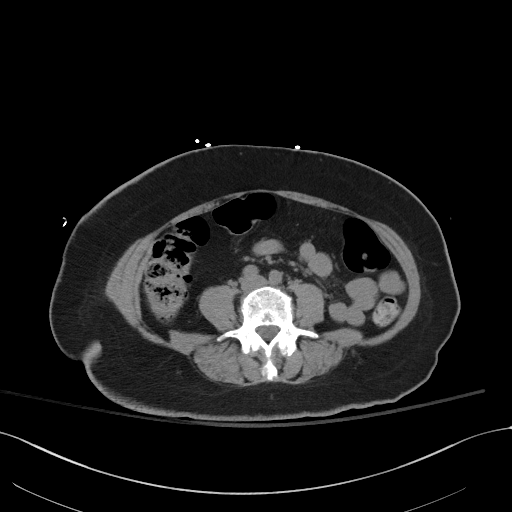
[im 53/88  soft-tissue]
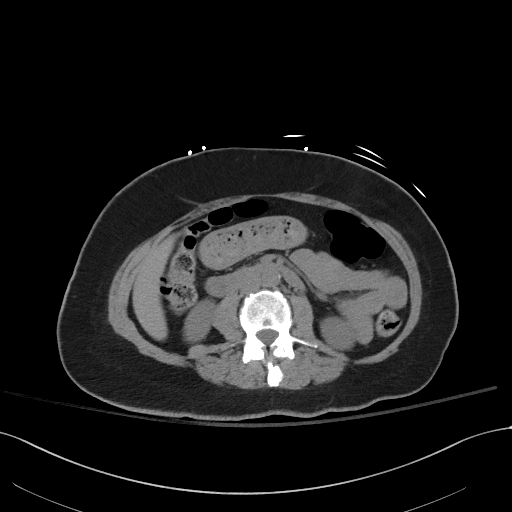
[im 53/88  bone]
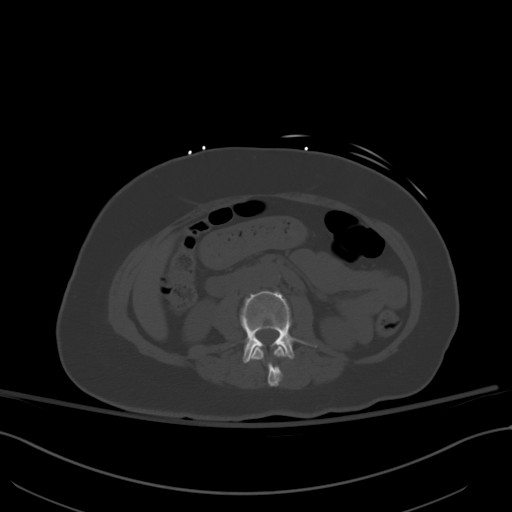
[im 60/88  soft-tissue]
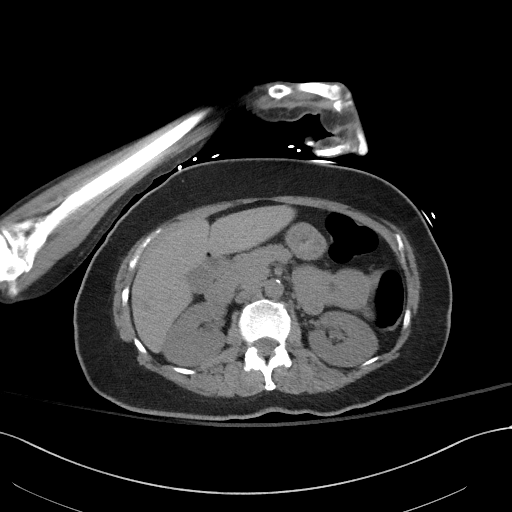
[im 67/88  soft-tissue]
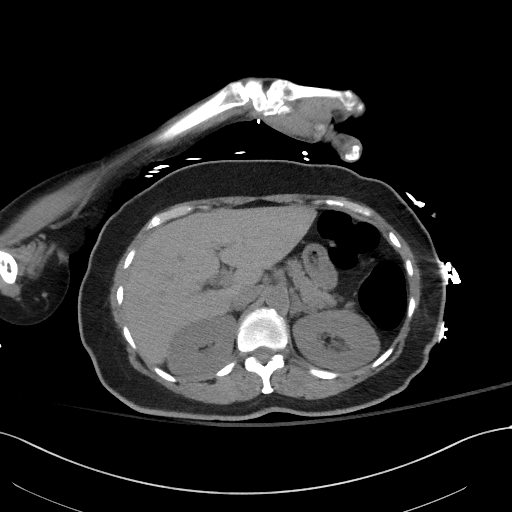
[im 70/88  soft-tissue]
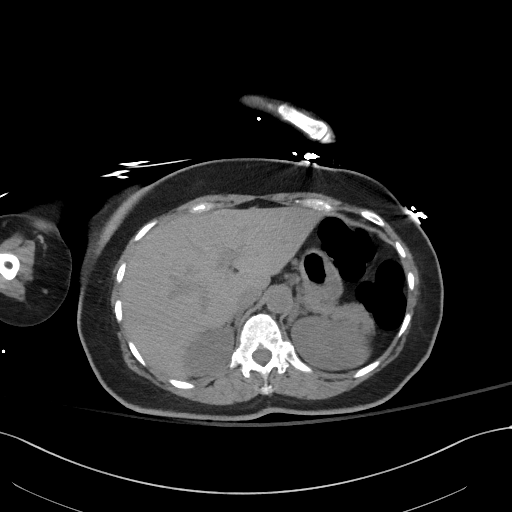
[im 77/88  soft-tissue]
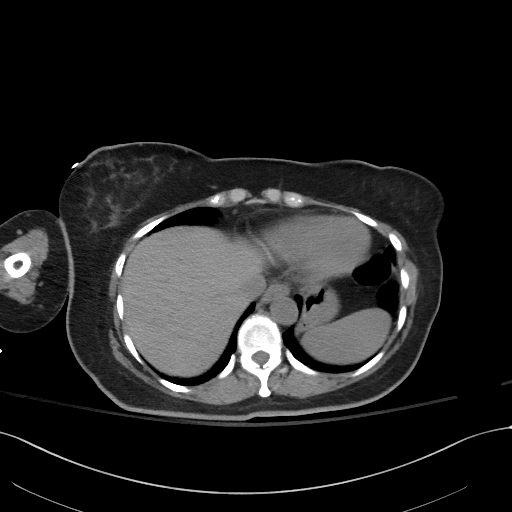
[im 84/88  soft-tissue]
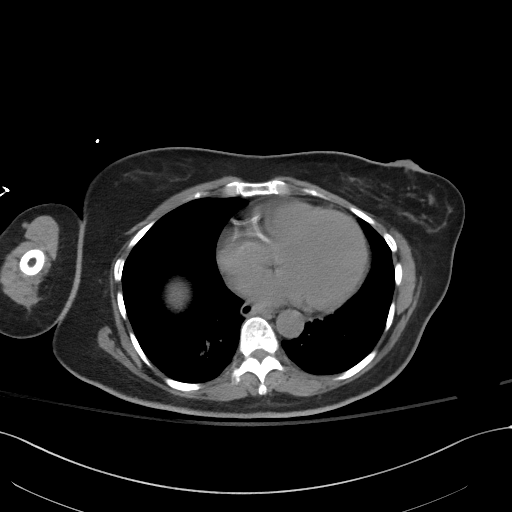

[Series 5: coronal st · coronal · 0.83mm/px · 3 of 65 slices shown]
[im 22/65  soft-tissue]
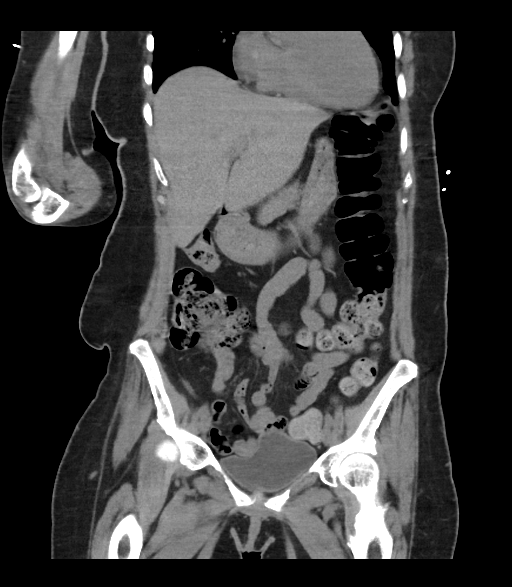
[im 29/65  soft-tissue]
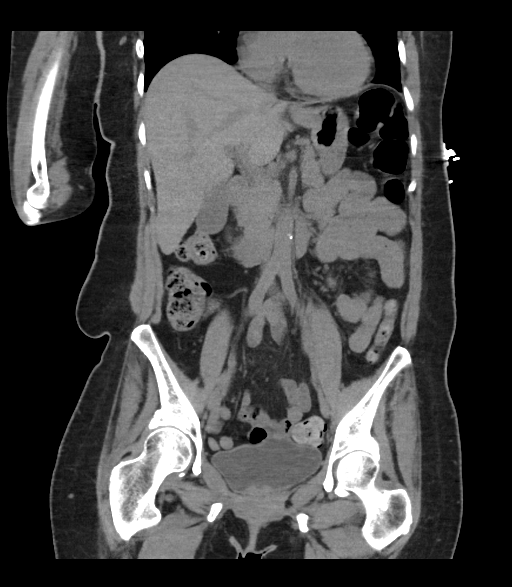
[im 36/65  soft-tissue]
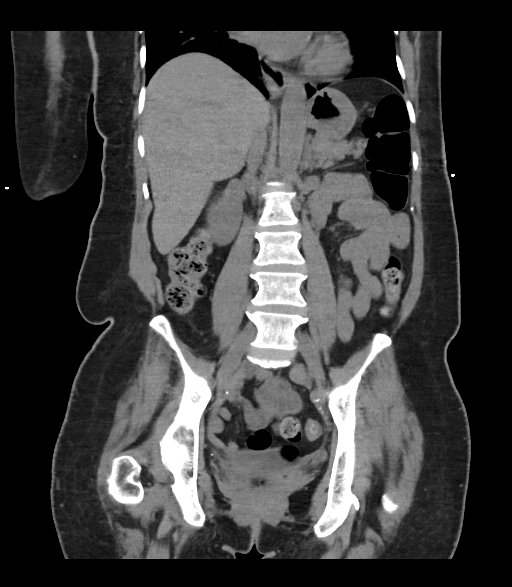

[17 of 46 positions shown; findings below may reference images not displayed]

FINDINGS: Lower chest: Coronary atherosclerosis.  Mild cardiomegaly.

Hepatobiliary: Unremarkable

Pancreas: Punctate calcification in the pancreatic head on image
32/2, stable from 09/29/2011, and likely due to remote inflammation.

Spleen: Unremarkable

Adrenals/Urinary Tract: Fullness of the left adrenal gland without
discrete mass. No hydronephrosis, hydroureter, or discrete ureteral
calculus. Urinary bladder unremarkable.

Stomach/Bowel: Prominent stool throughout the colon favors
constipation. Appendix normal.

Vascular/Lymphatic: Aortoiliac atherosclerotic vascular disease.
Right external iliac node 1 cm in short axis on image 66/2, stable.
Upper normal sized inguinal lymph nodes, stable. Left external iliac
node 1.0 cm in diameter, stable.

Reproductive: Uterus absent.  Adnexa unremarkable.

Other: No supplemental non-categorized findings.

Musculoskeletal: Spurring along the hamstring origination sites.
IMPRESSION: 1.  Prominent stool throughout the colon favors constipation.
2. Aortic Atherosclerosis ([id number]). Coronary atherosclerosis
with mild cardiomegaly.
3. Borderline prominent lower pelvic adenopathy, but chronically
stable, likely incidental.

## 2018-12-22 IMAGING — DX DG HAND COMPLETE 3+V*R*
3 series · 3 of 3 positions shown · non-contrast
Comparison: None.

CLINICAL DATA: Right hand pain and shoulder pain

EXAM:
RIGHT HAND - COMPLETE 3+ VIEW

[hand pa]
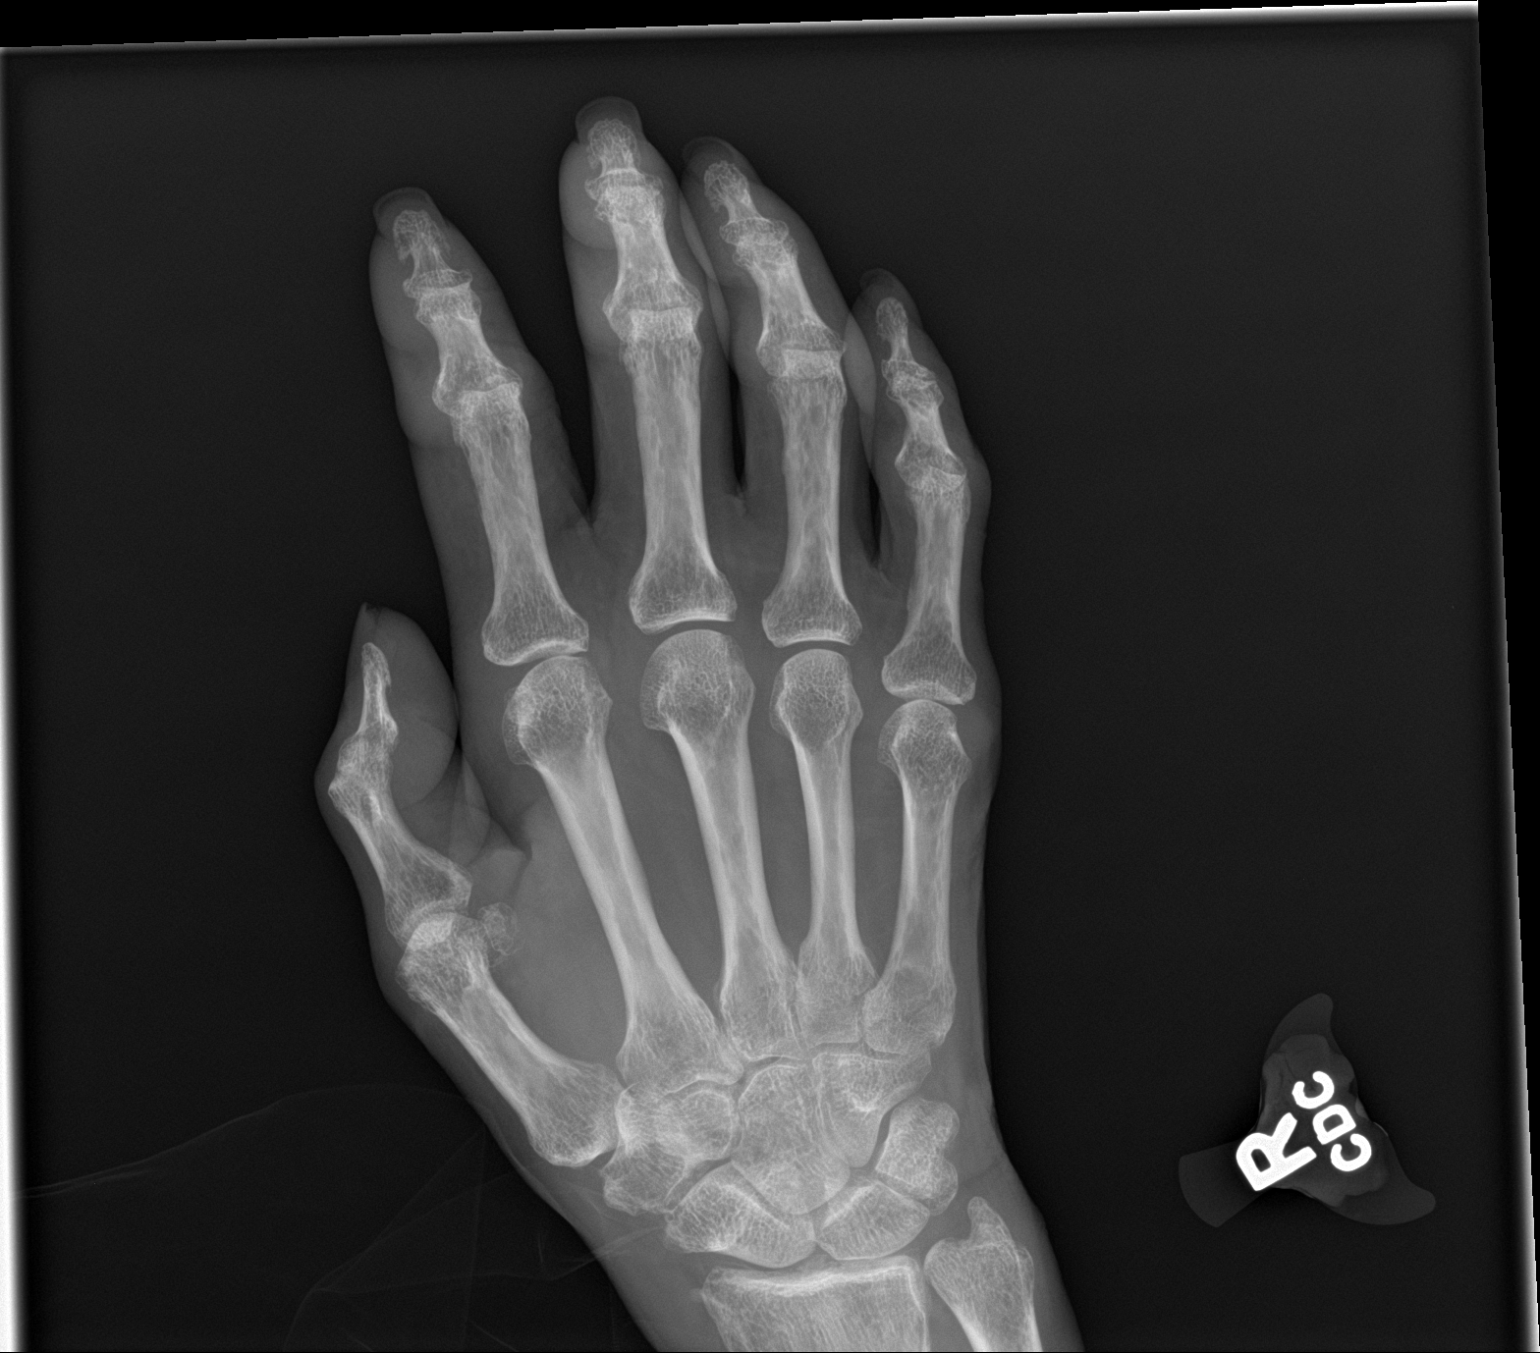

[hand obl]
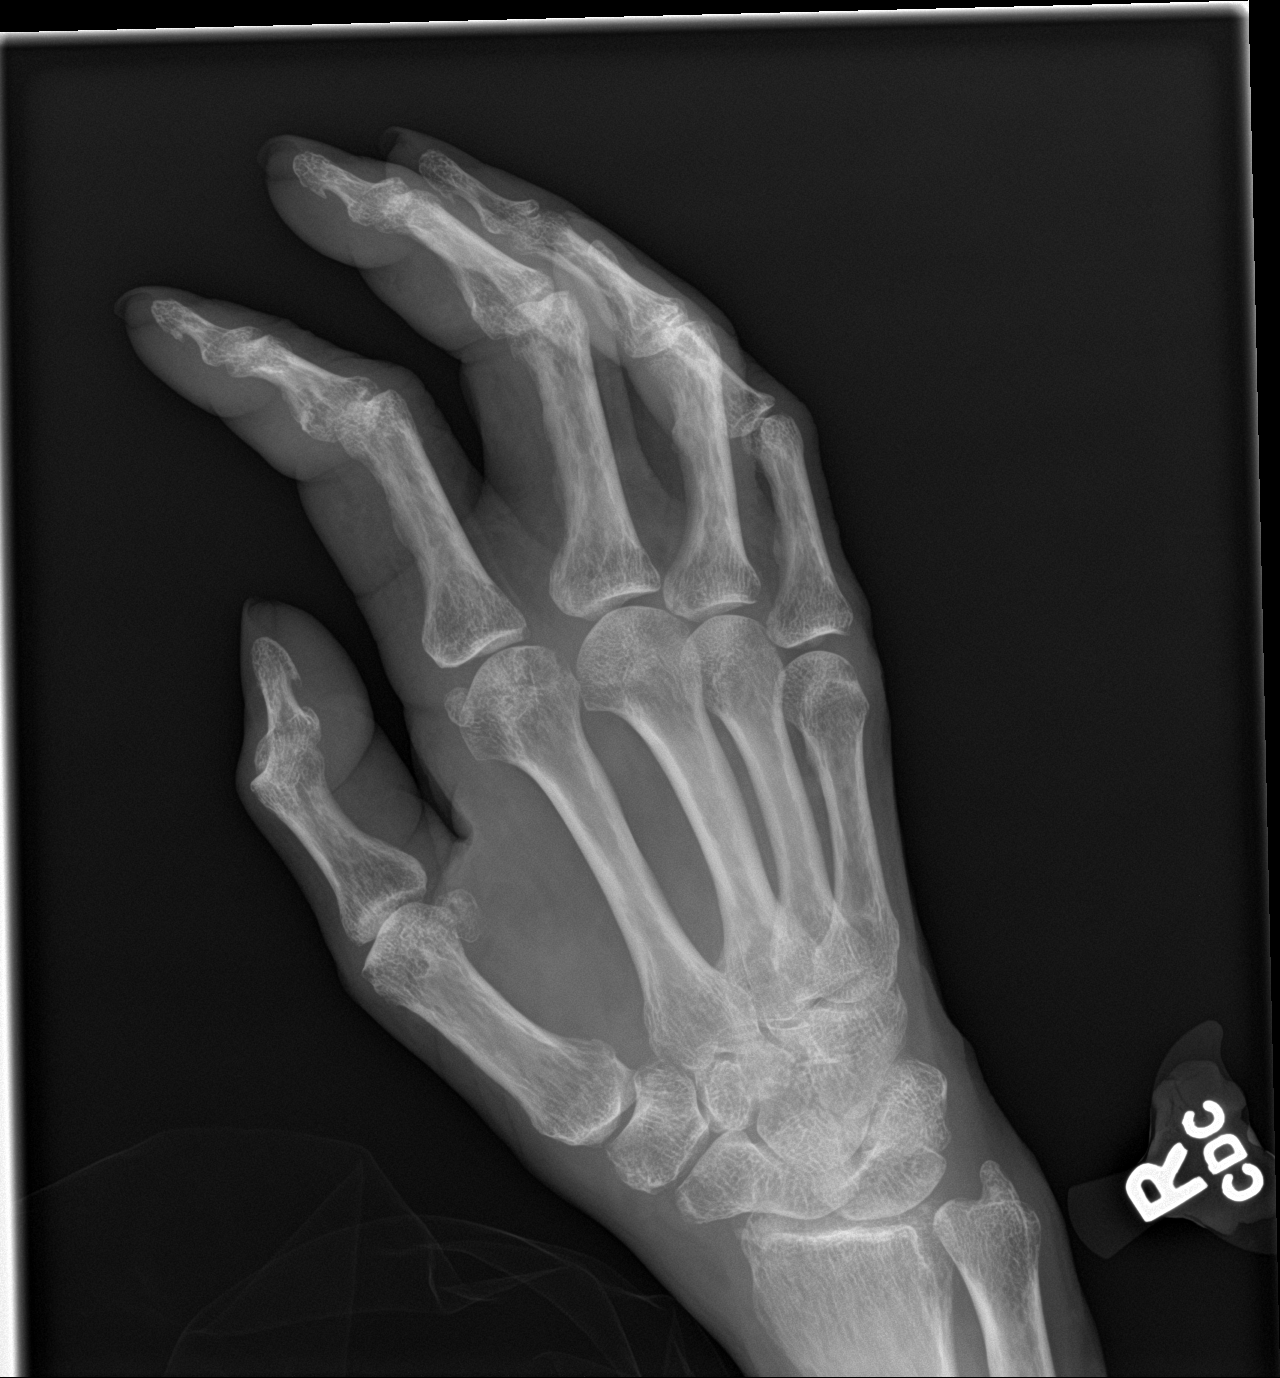

[hand lat]
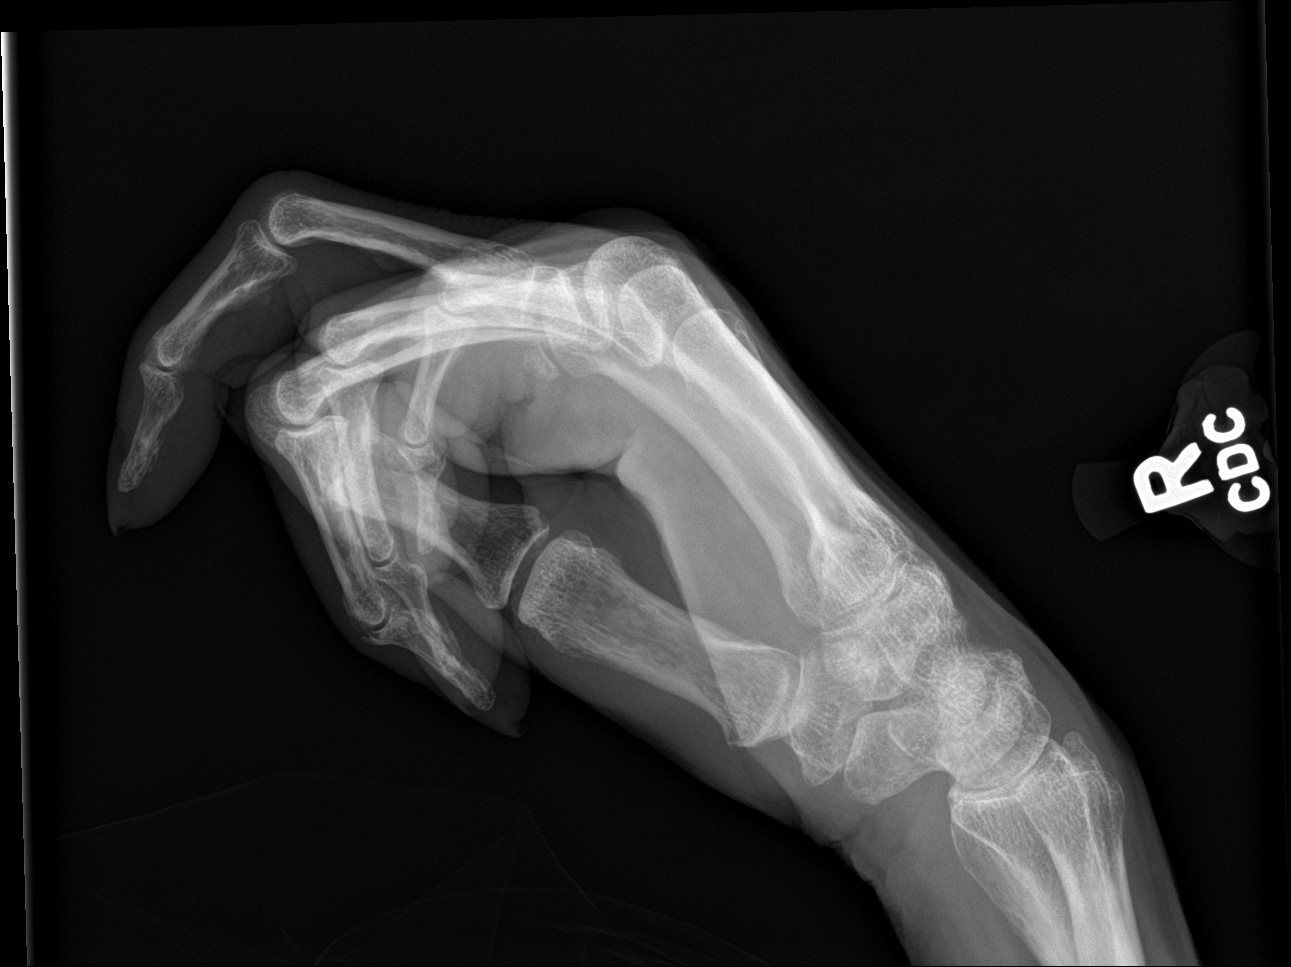

[3 of 3 positions shown; findings below may reference images not displayed]

FINDINGS: No fracture or malalignment. The examination is slightly limited by
positioning. Suspected degenerative changes at the DIP joints. Soft
tissues are unremarkable.
IMPRESSION: Suboptimal study secondary to positioning. No acute osseous
abnormality. Suspect degenerative changes at the DIP joints.

## 2019-01-11 IMAGING — DX DG CHEST 2V
2 series · 2 of 2 positions shown · non-contrast
Comparison: Prior CT from 08/24/2016.

CLINICAL DATA: Initial evaluation for acute shortness of breath,
fatigue.

EXAM:
CHEST  2 VIEW

[chest lat]
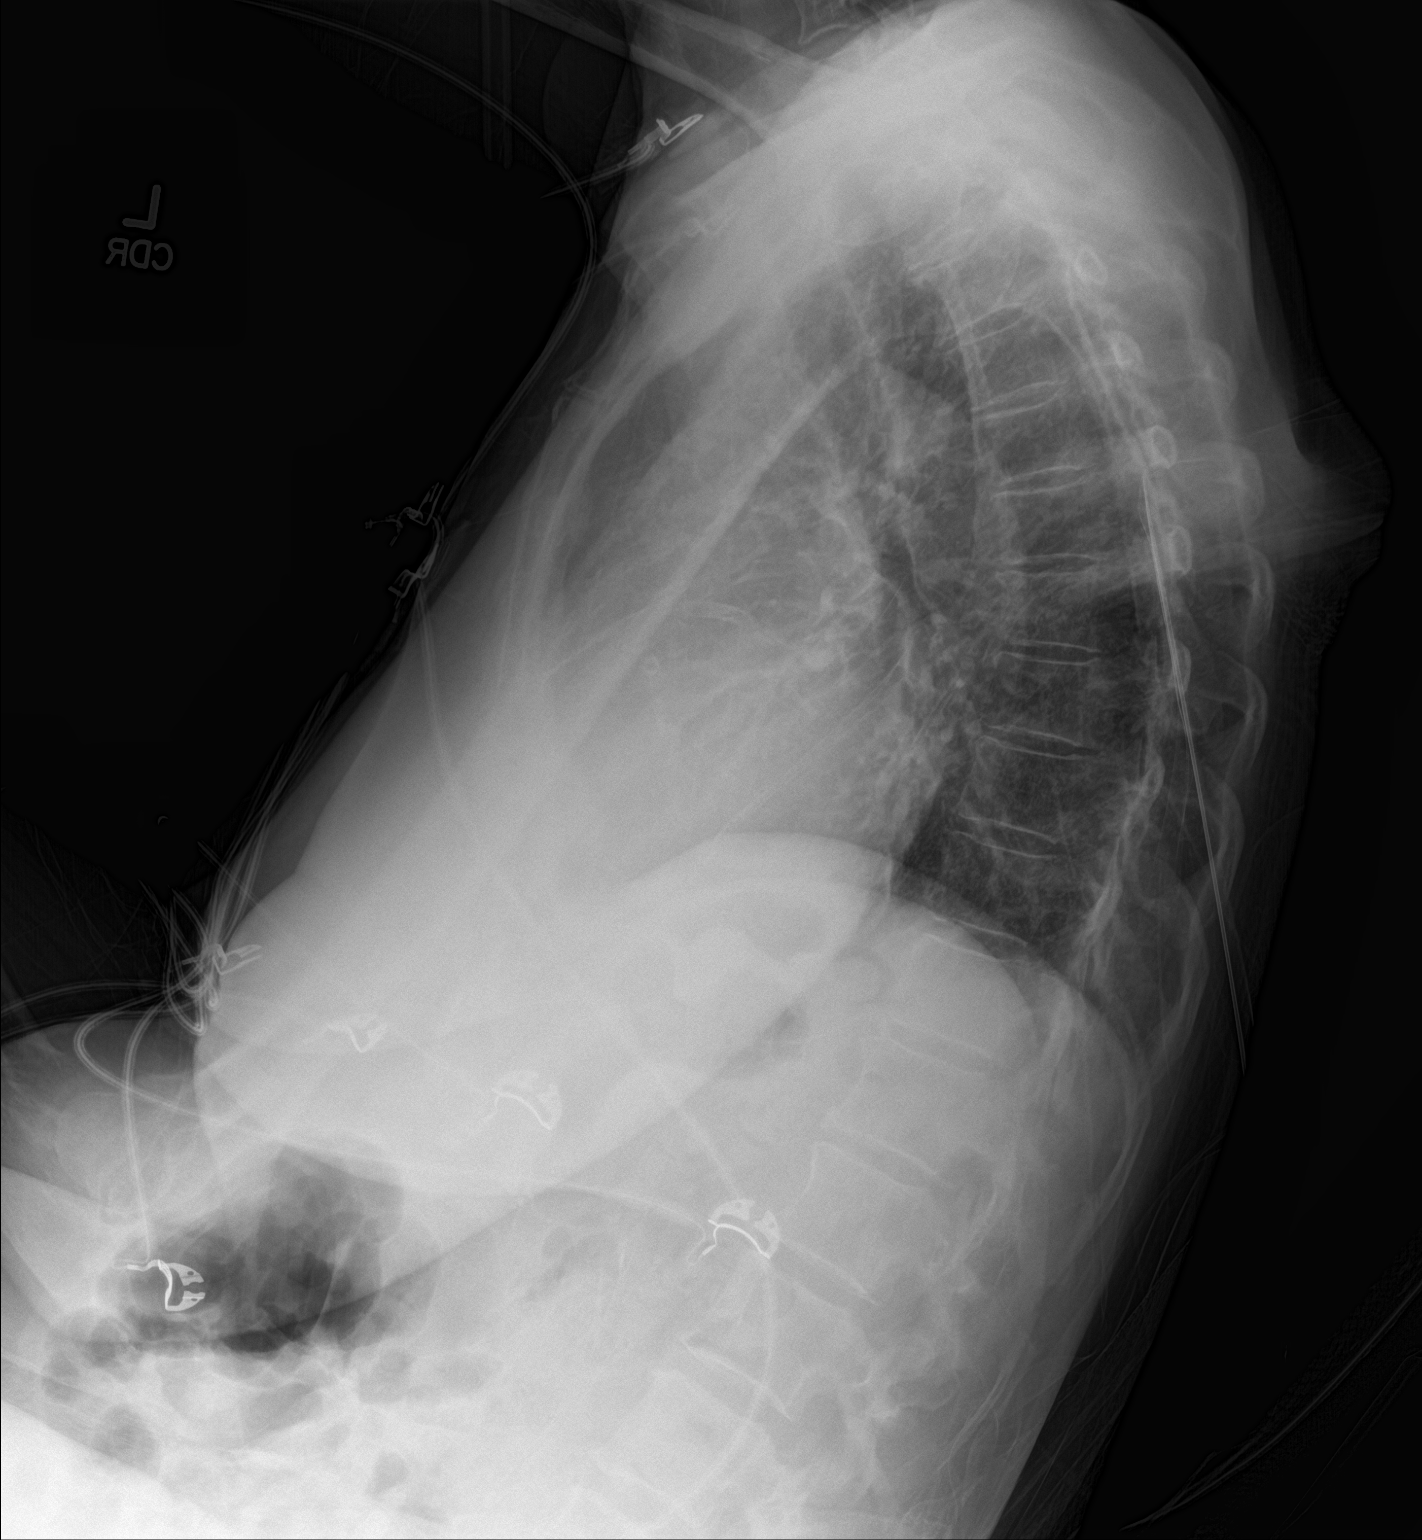

[chest ap strecther]
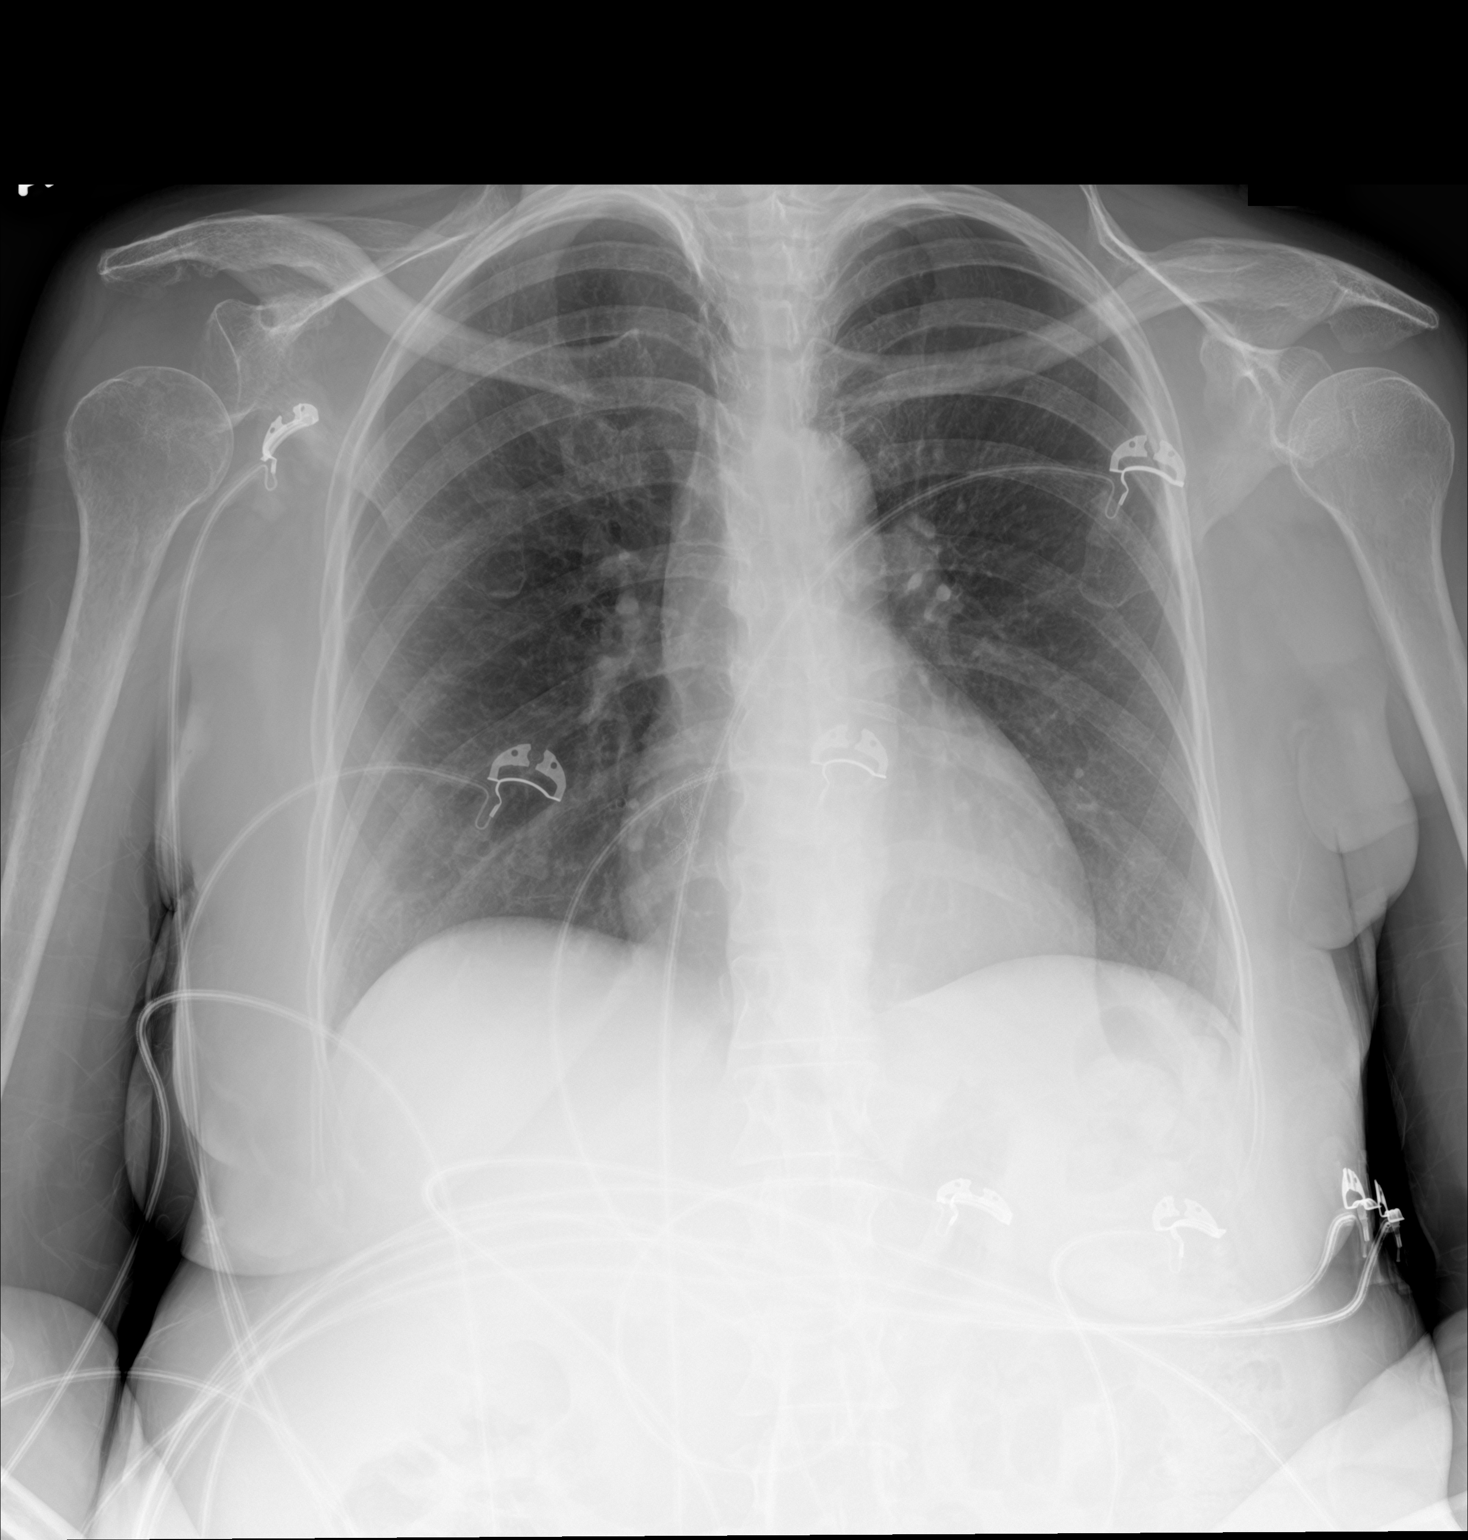

[2 of 2 positions shown; findings below may reference images not displayed]

FINDINGS: The cardiac and mediastinal silhouettes are stable in size and
contour, and remain within normal limits. Coronary stent noted.

The lungs are normally inflated. No airspace consolidation, pleural
effusion, or pulmonary edema is identified. There is no
pneumothorax.

Right humeral head is subluxed inferiorly relative to the glenoid.
No other acute osseous abnormality.
IMPRESSION: 1. No active cardiopulmonary disease.
2. Inferior subluxation of the right humeral head relative to the
glenoid.

## 2019-01-20 ENCOUNTER — Other Ambulatory Visit: Payer: Medicare Other | Admitting: Internal Medicine

## 2019-01-20 DIAGNOSIS — Z515 Encounter for palliative care: Secondary | ICD-10-CM

## 2019-01-20 NOTE — Progress Notes (Signed)
    [facility] Pall[facility] Care Consult Note Telephone: [phone]  Fax: [phone]  PATIENT NAME: Laura Oconnor DOB: [birth year] MRN: [id number]  PRIMARY CARE PROVIDER:   Clovia Cuff, MD  REFERRING PROVIDER:  Clovia Cuff, MD [address] [place],  [place] [address]  RESPONSIBLE PARTY:   Self and son Edrick     RECOMMENDATIONS and PLAN: Palliative Care Encounter  Z51.5  1.  Advance Care Planning:   Advanced directives remain as full scope of treatment.  Her goals are to improve strength and mobility.  She will continue to live at home with son. Palliative care will f/u with pt in aprox 6 weeks.   2. Weakness:  At baselinet Chronic state. Continue home PT.  3.  Depression with anxiety:   Chronic state and managed by behavioral health provider. Stable today.  Continue counseling and distractive activities.   4. Sacral wound:  Improving(per son). Continue wound care per nursing.   Sliding tub seat would be beneficial in preventing skin shearing.  I spent 20 minutes providing this consultation,  from 1300 to 1320. More than 50% of the time in this consultation was spent coordinating communication with pt, son and caregiver.  HISTORY OF PRESENT ILLNESS: Followup with  Laura Oconnor.  Pt reports that she has been eating and sleeping well.  Reports increased ambulation with use of pegged cane with assistance. No reports of falls or acute illnesses.  Palliative Care was asked to help address goals of care.   CODE STATUS: FULL CODE  PPS: 40% HOSPICE ELIGIBILITY/DIAGNOSIS: TBD  PAST MEDICAL HISTORY:  Past Medical History:  Diagnosis Date  . Chronic arm pain   . Chronic pain syndrome   . Decubital ulcer   . Diabetes mellitus   . Heart attack ([place])   . History of right coronary artery stent placement    2 stents placed at separate times  . Hypertension   . Stroke ([place])   . Thyroid disease       PHYSICAL EXAM:   General: NAD, frail appearing,  thin Cardiovascular: regular rate and rhythm Pulmonary: clear throughout Abdomen: soft, nontender, + bowel sounds GU: no suprapubic tenderness Extremities: R hand contractures Skin: no rashes Neurological: expressive aphasia, R hemiparesis  Gonzella Lex, NP-C

## 2019-01-21 ENCOUNTER — Other Ambulatory Visit: Payer: Self-pay

## 2019-03-06 ENCOUNTER — Other Ambulatory Visit: Payer: Medicare Other | Admitting: Internal Medicine

## 2019-03-07 ENCOUNTER — Other Ambulatory Visit: Payer: Self-pay

## 2019-04-13 ENCOUNTER — Emergency Department (HOSPITAL_BASED_OUTPATIENT_CLINIC_OR_DEPARTMENT_OTHER)
Admission: EM | Admit: 2019-04-13 | Discharge: 2019-04-13 | Disposition: A | Discharge status: Home / Self Care | Payer: Medicare Other | Attending: Emergency Medicine | Admitting: Emergency Medicine

## 2019-04-13 ENCOUNTER — Encounter (HOSPITAL_BASED_OUTPATIENT_CLINIC_OR_DEPARTMENT_OTHER): Payer: Self-pay | Admitting: *Deleted

## 2019-04-13 ENCOUNTER — Other Ambulatory Visit: Payer: Self-pay

## 2019-04-13 DIAGNOSIS — Z853 Personal history of malignant neoplasm of breast: Secondary | ICD-10-CM | POA: Insufficient documentation

## 2019-04-13 DIAGNOSIS — Z8673 Personal history of transient ischemic attack (TIA), and cerebral infarction without residual deficits: Secondary | ICD-10-CM | POA: Insufficient documentation

## 2019-04-13 DIAGNOSIS — I10 Essential (primary) hypertension: Secondary | ICD-10-CM | POA: Insufficient documentation

## 2019-04-13 DIAGNOSIS — Z955 Presence of coronary angioplasty implant and graft: Secondary | ICD-10-CM | POA: Insufficient documentation

## 2019-04-13 DIAGNOSIS — F17210 Nicotine dependence, cigarettes, uncomplicated: Secondary | ICD-10-CM | POA: Insufficient documentation

## 2019-04-13 DIAGNOSIS — R531 Weakness: Secondary | ICD-10-CM | POA: Diagnosis present

## 2019-04-13 DIAGNOSIS — E119 Type 2 diabetes mellitus without complications: Secondary | ICD-10-CM | POA: Diagnosis not present

## 2019-04-13 LAB — COMPREHENSIVE METABOLIC PANEL
ALT: 12 U/L (ref 0–44)
AST: 15 U/L (ref 15–41)
Albumin: 3.9 g/dL (ref 3.5–5.0)
Alkaline Phosphatase: 134 U/L — ABNORMAL HIGH (ref 38–126)
Anion gap: 7 (ref 5–15)
BUN: 9 mg/dL (ref 8–23)
CO2: 27 mmol/L (ref 22–32)
Calcium: 9.4 mg/dL (ref 8.9–10.3)
Chloride: 103 mmol/L (ref 98–111)
Creatinine, Ser: 0.39 mg/dL — ABNORMAL LOW (ref 0.44–1.00)
GFR calc Af Amer: >60 mL/min (ref >60)
GFR calc non Af Amer: >60 mL/min (ref >60)
Glucose, Bld: 242 mg/dL — ABNORMAL HIGH (ref 70–99)
Potassium: 3.4 mmol/L — ABNORMAL LOW (ref 3.5–5.1)
Sodium: 137 mmol/L (ref 135–145)
Total Bilirubin: 0.5 mg/dL (ref 0.3–1.2)
Total Protein: 7.9 g/dL (ref 6.5–8.1)

## 2019-04-13 LAB — CBC WITH DIFFERENTIAL/PLATELET
Abs Immature Granulocytes: 0.01 10*3/uL (ref 0.00–0.07)
Basophils Absolute: 0.1 10*3/uL (ref 0.0–0.1)
Basophils Relative: 1 %
Eosinophils Absolute: 0.7 10*3/uL — ABNORMAL HIGH (ref 0.0–0.5)
Eosinophils Relative: 8 %
HCT: 39.6 % (ref 36.0–46.0)
Hemoglobin: 12.3 g/dL (ref 12.0–15.0)
Immature Granulocytes: 0 %
Lymphocytes Relative: 36 %
Lymphs Abs: 3 10*3/uL (ref 0.7–4.0)
MCH: 26.6 pg (ref 26.0–34.0)
MCHC: 31.1 g/dL (ref 30.0–36.0)
MCV: 85.5 fL (ref 80.0–100.0)
Monocytes Absolute: 0.9 10*3/uL (ref 0.1–1.0)
Monocytes Relative: 10 %
Neutro Abs: 3.9 10*3/uL (ref 1.7–7.7)
Neutrophils Relative %: 45 %
Platelets: 235 10*3/uL (ref 150–400)
RBC: 4.63 MIL/uL (ref 3.87–5.11)
RDW: 14.8 % (ref 11.5–15.5)
WBC: 8.6 10*3/uL (ref 4.0–10.5)
nRBC: 0 % (ref 0.0–0.2)

## 2019-04-13 MED ORDER — SODIUM CHLORIDE 0.9 % IV BOLUS
500.0000 mL | Freq: Once | INTRAVENOUS | Status: AC
  Administered 2019-04-13: 18:00:00 500 mL via INTRAVENOUS

## 2019-04-13 NOTE — ED Provider Notes (Signed)
Emergency Department Provider Note   I have reviewed the triage vital signs and the nursing notes.   HISTORY  Chief Complaint Weakness   HPI Laura Oconnor is a 63 y.o. female with PMH reviewed below presents to the ED presents to the emergency department with generalized weakness.  Symptoms have been ongoing for the past 1 to 2 days.  Patient has chronic constipation requiring enemas for bowel movements which is not unusual.  She denies any fevers, chills.  She is not having cough, shortness of breath, chest pain, runny nose, or sore throat.  She denies any dysuria, hesitancy, urgency.  Patient states that in the past she has had anemia and required blood transfusion and she is wondering if this could be the case today.  Denies any unilateral weakness, vision changes, other severe symptoms.  No radiation or modifying factors.   Past Medical History:  Diagnosis Date  . Chronic arm pain   . Chronic pain syndrome   . Decubital ulcer   . Diabetes mellitus   . Heart attack ([place])   . History of right coronary artery stent placement    2 stents placed at separate times  . Hypertension   . Stroke ([place])   . Thyroid disease     Patient Active Problem List   Diagnosis Date Noted  . Palliative care encounter 12/05/2017  . Weakness 12/05/2017  . Acute on chronic anemia 04/26/2017  . TIA (transient ischemic attack) 02/22/2017  . Right hip pain 02/09/2017  . Right shoulder pain 01/13/2017  . History of CVA with residual deficit 10/16/2016  . Weakness as late effect of cerebrovascular accident (CVA) 08/09/2016  . Depression with anxiety 08/04/2016  . Hemiplegia affecting right dominant side ([name]) 05/31/2016  . Hypokalemia 08/13/2014  . Diarrhea 08/13/2014  . Loss of weight 08/13/2014  . Hypocalcemia 08/13/2014  . Atrophic vaginitis 08/13/2014  . Type 2 diabetes mellitus, uncontrolled ([name]) 08/13/2014  . Breast cancer ([place]) 08/13/2014  . Hypothyroidism 08/13/2014  . CAD (coronary  artery disease) 08/13/2014    Past Surgical History:  Procedure Laterality Date  . BLADDER SUSPENSION    . LIPOMA EXCISION      Allergies Amoxicillin, Morphine and related, Percocet [oxycodone-acetaminophen], Percodan [oxycodone-aspirin], Acetaminophen, Cymbalta [duloxetine hcl], Gabapentin, Lexapro [escitalopram], Penicillins, Percodan [oxycodone-aspirin], Torecan [thiethylperazine], and Sulfa antibiotics  Family History  Problem Relation Age of Onset  . Diabetes Father   . Diabetes Sister   . Diabetes Brother     Social History Social History   Tobacco Use  . Smoking status: Current Every Day Smoker    Packs/day: 0.50    Types: Cigarettes  . Smokeless tobacco: Never Used  Substance Use Topics  . Alcohol use: No  . Drug use: No    Review of Systems  Constitutional: No fever/chills. Positive generalized weakness.  Eyes: No visual changes. ENT: No sore throat. Cardiovascular: Denies chest pain. Respiratory: Denies shortness of breath. Gastrointestinal: No abdominal pain.  No nausea, no vomiting.  No diarrhea.  No constipation. Genitourinary: Negative for dysuria. Musculoskeletal: Negative for back pain. Skin: Negative for rash. Neurological: Negative for headaches, focal weakness or numbness.  10-point ROS otherwise negative.  ____________________________________________   PHYSICAL EXAM:  VITAL SIGNS: ED Triage Vitals  Enc Vitals Group     BP 04/13/19 1728 (!) 151/80     Pulse Rate 04/13/19 1728 100     Resp 04/13/19 1728 18     Temp 04/13/19 1728 98.4 F (36.9 C)  Temp Source 04/13/19 1728 Oral     SpO2 04/13/19 1728 99 %   Constitutional: Alert and oriented. Well appearing and in no acute distress. Eyes: Conjunctivae are normal.  Head: Atraumatic. Nose: No congestion/rhinnorhea. Mouth/Throat: Mucous membranes are moist.   Neck: No stridor.   Cardiovascular: Normal rate, regular rhythm. Good peripheral circulation. Grossly normal heart sounds.     Respiratory: Normal respiratory effort.  No retractions. Lungs CTAB. Gastrointestinal: Soft and nontender. No distention.  Musculoskeletal: No gross deformities of extremities. Neurologic:  Normal speech and language. Spastic paresis of the RUE (baseline).  Skin:  Skin is warm, dry and intact. No rash noted.  ____________________________________________   LABS (all labs ordered are listed, but only abnormal results are displayed)  Labs Reviewed  COMPREHENSIVE METABOLIC PANEL - Abnormal; Notable for the following components:      Result Value   Potassium 3.4 (*)    Glucose, Bld 242 (*)    Creatinine, Ser 0.39 (*)    Alkaline Phosphatase 134 (*)    All other components within normal limits  CBC WITH DIFFERENTIAL/PLATELET - Abnormal; Notable for the following components:   Eosinophils Absolute 0.7 (*)    All other components within normal limits   ____________________________________________  RADIOLOGY  None  ____________________________________________   PROCEDURES  Procedure(s) performed:   Procedures  None  ____________________________________________   INITIAL IMPRESSION / ASSESSMENT AND PLAN / ED COURSE  Pertinent labs & imaging results that were available during my care of the patient were reviewed by me and considered in my medical decision making (see chart for details).   Patient presents to the emergency department with generalized weakness.  She is afebrile with largely normal vital signs.  Screening labs obtained with history of anemia requiring transfusion.  No significant electrolyte abnormalities.  Creatinine is near baseline.  Hemoglobin today is 12.3.   Patient does not have symptoms to make me suspect COVID-19.  On reassessment she is not having UTI signs or symptoms.  She is feeling improved after IV fluid bolus.  Plan for discharge home with PCP follow-up in the coming week to reassess and see if additional testing, such as thyroid testing, as  indicated.  Discussed ED return precautions.  Patient is pleased at discharge. ____________________________________________  FINAL CLINICAL IMPRESSION(S) / ED DIAGNOSES  Final diagnoses:  Generalized weakness    MEDICATIONS GIVEN DURING THIS VISIT:  Medications  sodium chloride 0.9 % bolus 500 mL (500 mLs Intravenous New Bag/Given 04/13/19 1749)    Note:  This document was prepared using Dragon voice recognition software and may include unintentional dictation errors.  Nanda Quinton, MD, [facility]    Abigaile Rossie, Wonda Olds, MD 04/13/19 [phone]

## 2019-04-13 NOTE — Discharge Instructions (Signed)
You were seen in the emergency department today with generalized weakness.  Your lab work including your blood counts were normal.  You may have some mild dehydration.  Please call your primary care doctor tomorrow to schedule a follow-up appointment to discuss it further testing, such as thyroid testing, as needed.  Return to the emergency department with any new or suddenly worsening symptoms.

## 2019-04-13 NOTE — ED Triage Notes (Signed)
Pt in BR with family prior to triage

## 2019-04-13 NOTE — ED Notes (Signed)
ED Provider at bedside. 

## 2019-04-13 NOTE — ED Triage Notes (Addendum)
Pt reports feeling weak. Concerned she may need fluids or a blood Tx. History of stroke with residual right side weakness. She has a caregiver with her. Reports increased BM after having an enema yesterday

## 2019-04-13 NOTE — ED Notes (Signed)
Caregiver, [name] at bedside with Patient

## 2019-05-21 ENCOUNTER — Telehealth: Payer: Self-pay

## 2019-05-21 NOTE — Telephone Encounter (Signed)
Received message from patient requesting to schedule visit with Enid Derry NP. Message left for patient with time of Friday 05/23/2019 @ 2 pm, awaiting confirmation from patient

## 2019-05-23 ENCOUNTER — Telehealth: Payer: Self-pay | Admitting: Internal Medicine

## 2019-05-23 NOTE — Telephone Encounter (Signed)
Rec'd call from patient wanting to cancel Palliative visit for today, I offered to reschedule the visit and patient said she would call Gonzella Lex, NP and reschedule.  Notified NP.

## 2019-05-26 NOTE — Telephone Encounter (Signed)
Note entered for 05/21/2019: Patient confirmed visit for 05/26/2019

## 2019-10-16 ENCOUNTER — Other Ambulatory Visit: Payer: Self-pay

## 2019-10-16 ENCOUNTER — Encounter (HOSPITAL_BASED_OUTPATIENT_CLINIC_OR_DEPARTMENT_OTHER): Payer: Self-pay

## 2019-10-16 ENCOUNTER — Emergency Department (HOSPITAL_BASED_OUTPATIENT_CLINIC_OR_DEPARTMENT_OTHER): Payer: Medicare Other

## 2019-10-16 ENCOUNTER — Emergency Department (HOSPITAL_BASED_OUTPATIENT_CLINIC_OR_DEPARTMENT_OTHER)
Admission: EM | Admit: 2019-10-16 | Discharge: 2019-10-16 | Disposition: A | Discharge status: Home / Self Care | Payer: Medicare Other | Attending: Emergency Medicine | Admitting: Emergency Medicine

## 2019-10-16 DIAGNOSIS — Z8673 Personal history of transient ischemic attack (TIA), and cerebral infarction without residual deficits: Secondary | ICD-10-CM | POA: Diagnosis not present

## 2019-10-16 DIAGNOSIS — I2510 Atherosclerotic heart disease of native coronary artery without angina pectoris: Secondary | ICD-10-CM | POA: Diagnosis not present

## 2019-10-16 DIAGNOSIS — R531 Weakness: Secondary | ICD-10-CM

## 2019-10-16 DIAGNOSIS — M6281 Muscle weakness (generalized): Secondary | ICD-10-CM | POA: Insufficient documentation

## 2019-10-16 DIAGNOSIS — R29810 Facial weakness: Secondary | ICD-10-CM | POA: Diagnosis not present

## 2019-10-16 DIAGNOSIS — E119 Type 2 diabetes mellitus without complications: Secondary | ICD-10-CM | POA: Diagnosis not present

## 2019-10-16 DIAGNOSIS — R471 Dysarthria and anarthria: Secondary | ICD-10-CM | POA: Insufficient documentation

## 2019-10-16 DIAGNOSIS — Z794 Long term (current) use of insulin: Secondary | ICD-10-CM | POA: Insufficient documentation

## 2019-10-16 DIAGNOSIS — I10 Essential (primary) hypertension: Secondary | ICD-10-CM | POA: Insufficient documentation

## 2019-10-16 DIAGNOSIS — Z955 Presence of coronary angioplasty implant and graft: Secondary | ICD-10-CM | POA: Diagnosis not present

## 2019-10-16 DIAGNOSIS — F17210 Nicotine dependence, cigarettes, uncomplicated: Secondary | ICD-10-CM | POA: Insufficient documentation

## 2019-10-16 DIAGNOSIS — Z8674 Personal history of sudden cardiac arrest: Secondary | ICD-10-CM | POA: Insufficient documentation

## 2019-10-16 DIAGNOSIS — E039 Hypothyroidism, unspecified: Secondary | ICD-10-CM | POA: Diagnosis not present

## 2019-10-16 LAB — URINALYSIS, ROUTINE W REFLEX MICROSCOPIC
Bilirubin Urine: NEGATIVE
Glucose, UA: >=500 mg/dL — AB
Hgb urine dipstick: NEGATIVE
Ketones, ur: NEGATIVE mg/dL
Leukocytes,Ua: NEGATIVE
Nitrite: NEGATIVE
Protein, ur: NEGATIVE mg/dL
Specific Gravity, Urine: 1.015 (ref 1.005–1.030)
pH: 7 (ref 5.0–8.0)

## 2019-10-16 LAB — HEPATIC FUNCTION PANEL
ALT: 11 U/L (ref 0–44)
AST: 17 U/L (ref 15–41)
Albumin: 4.2 g/dL (ref 3.5–5.0)
Alkaline Phosphatase: 134 U/L — ABNORMAL HIGH (ref 38–126)
Bilirubin, Direct: 0.2 mg/dL (ref 0.0–0.2)
Indirect Bilirubin: 0.2 mg/dL — ABNORMAL LOW (ref 0.3–0.9)
Total Bilirubin: 0.4 mg/dL (ref 0.3–1.2)
Total Protein: 8.5 g/dL — ABNORMAL HIGH (ref 6.5–8.1)

## 2019-10-16 LAB — URINALYSIS, MICROSCOPIC (REFLEX)

## 2019-10-16 LAB — BASIC METABOLIC PANEL
Anion gap: 10 (ref 5–15)
BUN: 8 mg/dL (ref 8–23)
CO2: 28 mmol/L (ref 22–32)
Calcium: 9.8 mg/dL (ref 8.9–10.3)
Chloride: 102 mmol/L (ref 98–111)
Creatinine, Ser: 0.5 mg/dL (ref 0.44–1.00)
GFR calc Af Amer: >60 mL/min (ref >60)
GFR calc non Af Amer: >60 mL/min (ref >60)
Glucose, Bld: 174 mg/dL — ABNORMAL HIGH (ref 70–99)
Potassium: 3.5 mmol/L (ref 3.5–5.1)
Sodium: 140 mmol/L (ref 135–145)

## 2019-10-16 LAB — CBC
HCT: 39.9 % (ref 36.0–46.0)
Hemoglobin: 12.5 g/dL (ref 12.0–15.0)
MCH: 27.5 pg (ref 26.0–34.0)
MCHC: 31.3 g/dL (ref 30.0–36.0)
MCV: 87.7 fL (ref 80.0–100.0)
Platelets: 265 10*3/uL (ref 150–400)
RBC: 4.55 MIL/uL (ref 3.87–5.11)
RDW: 13.5 % (ref 11.5–15.5)
WBC: 9 10*3/uL (ref 4.0–10.5)
nRBC: 0 % (ref 0.0–0.2)

## 2019-10-16 LAB — CBG MONITORING, ED: Glucose-Capillary: 176 mg/dL — ABNORMAL HIGH (ref 70–99)

## 2019-10-16 MED ORDER — SODIUM CHLORIDE 0.9% FLUSH
3.0000 mL | Freq: Once | INTRAVENOUS | Status: DC
  Filled 2019-10-16: qty 3

## 2019-10-16 NOTE — ED Provider Notes (Signed)
[facility] EMERGENCY DEPARTMENT Provider Note   CSN: [id number] Arrival date & time: 10/16/19  1448     History Chief Complaint  Patient presents with  . Weakness    Laura Oconnor is a 64 y.o. female.  64 year old female with past medical history below including stroke with residual right-sided weakness, CAD, hypertension, type 2 diabetes mellitus, chronic pain syndrome who presents with weakness.  Family member reports that the patient has had intermittent generalized weakness over the past 1.5 to 2 weeks.  He states that some days are worse than others.  She states she has felt dehydrated over the past 2 to 3 days with decreased appetite.  She has chronic constipation, no changes recently, no blood in the stool.  No vomiting, abdominal pain, fevers, cough, URI symptoms, or new shortness of breath.  She reports pain in her right arm and right leg which she has chronically due to contractures.  No recent changes to her medications, no sick contacts.  The history is provided by the patient and a relative.  Weakness      Past Medical History:  Diagnosis Date  . Chronic arm pain   . Chronic pain syndrome   . Decubital ulcer   . Diabetes mellitus   . Heart attack ([place])   . History of right coronary artery stent placement    2 stents placed at separate times  . Hypertension   . Stroke ([place])   . Thyroid disease     Patient Active Problem List   Diagnosis Date Noted  . Palliative care encounter 12/05/2017  . Weakness 12/05/2017  . Acute on chronic anemia 04/26/2017  . TIA (transient ischemic attack) 02/22/2017  . Right hip pain 02/09/2017  . Right shoulder pain 01/13/2017  . History of CVA with residual deficit 10/16/2016  . Weakness as late effect of cerebrovascular accident (CVA) 08/09/2016  . Depression with anxiety 08/04/2016  . Hemiplegia affecting right dominant side ([place]) 05/31/2016  . Hypokalemia 08/13/2014  . Diarrhea 08/13/2014  . Loss of weight  08/13/2014  . Hypocalcemia 08/13/2014  . Atrophic vaginitis 08/13/2014  . Type 2 diabetes mellitus, uncontrolled ([place]) 08/13/2014  . Breast cancer ([place]) 08/13/2014  . Hypothyroidism 08/13/2014  . CAD (coronary artery disease) 08/13/2014    Past Surgical History:  Procedure Laterality Date  . BLADDER SUSPENSION    . LIPOMA EXCISION       OB History   No obstetric history on file.     Family History  Problem Relation Age of Onset  . Diabetes Father   . Diabetes Sister   . Diabetes Brother     Social History   Tobacco Use  . Smoking status: Current Every Day Smoker    Packs/day: 0.50    Types: Cigarettes  . Smokeless tobacco: Never Used  Vaping Use  . Vaping Use: Never used  Substance Use Topics  . Alcohol use: No  . Drug use: No    Home Medications Prior to Admission medications   Medication Sig Start Date End Date Taking? Authorizing Provider  ACCU-CHEK AVIVA PLUS test strip USE TO TEST BLOOD SUGAR UP TO QID 02/19/17   [provider]  ACCU-CHEK SOFTCLIX LANCETS lancets TEST UP TO QID 02/19/17   [provider]  aspirin EC 81 MG tablet Take 81 mg by mouth daily.     [provider]  atorvastatin (LIPITOR) 80 MG tablet Take 40 mg by mouth every morning.     [provider]  Benzocaine (BOIL-EASE EX) Apply 1 application topically daily as needed (pain from boils).    [provider]  benztropine (COGENTIN) 1 MG tablet Take 1 tablet (1 mg total) by mouth 2 (two) times daily. Patient not taking: Reported on 04/26/2017 12/31/16   Tanna Furry, MD  clopidogrel (PLAVIX) 75 MG tablet Take 75 mg by mouth daily.    [provider]  Colloidal Oatmeal (ECZEMA MOISTURIZING EX) Apply 1 application topically daily as needed (for irritation on buttocks).    [provider]  cyclobenzaprine (FLEXERIL) 5 MG tablet Take 1 tablet (5 mg total) by mouth 3 (three) times daily as needed for muscle spasms. 09/24/17   Horton, Barbette Hair, MD  docusate sodium (COLACE) 100 MG capsule Take 100 mg by mouth every morning.    [provider]  ferrous sulfate 220 (44 Fe) MG/5ML solution Take 220 mg by mouth daily.    [provider]  ferrous sulfate 325 (65 FE) MG tablet Take 1 tablet (325 mg total) by mouth 2 (two) times daily with a meal. 08/14/14   Thurnell Lose, MD  gabapentin (NEURONTIN) 100 MG capsule as needed. 50mg  ?    [provider]  ibuprofen (ADVIL,MOTRIN) 200 MG tablet Take 100 mg by mouth every 6 (six) hours as needed for headache (pain).    [provider]  insulin glargine (LANTUS) 100 UNIT/ML injection Inject 20-30 Units into the skin See admin instructions. 20-30 units two times a day after meals (lunch and dinner) 11/17/16   [provider]  Insulin Syringe-Needle U-100 31G X 1/4" 1 ML MISC 1 Dose by Misc.(Non-Drug; Combo Route) route daily. Or as directed to inject insulin under the skin 11/17/16   [provider]  lamoTRIgine (LAMICTAL) 25 MG tablet Take 1/2 tablet in AM/1 tablet at bedtime 04/04/19   [provider]  levothyroxine (SYNTHROID, LEVOTHROID) 137 MCG tablet Take 137 mcg by mouth daily before breakfast.    [provider]  LORazepam (ATIVAN) 0.5 MG tablet Take 0.5 mg by mouth 4 (four) times daily as needed for anxiety.  12/07/16   [provider]  OnabotulinumtoxinA (BOTOX IJ) every 3 (three) months. RIGHT ARM 03/19/17   [provider]  ondansetron (ZOFRAN-ODT) 4 MG disintegrating tablet Take 4 mg by mouth every 6 (six) hours as needed for nausea or vomiting.  12/22/16   [provider]  phenazopyridine (PYRIDIUM) 200 MG tablet Take 1 tablet (200 mg total) by mouth 3 (three) times daily. 08/23/17   Carlisle Cater, PA-C  polyethylene glycol (MIRALAX) packet Take 17 g by mouth daily. 09/25/16   Long, Wonda Olds, MD  psyllium (METAMUCIL) 0.52 g capsule Take 1 capsule (0.52 g total) by mouth daily. 10/24/18   Isla Pence, MD  senna-docusate (DOK PLUS) 8.6-50 MG tablet Take 1 tablet by mouth daily as needed for mild constipation.    [provider]  Spacer/Aero-Holding Chambers (AEROCHAMBER PLUS WITH MASK) inhaler Use as instructed 04/22/12   Riki Altes, MD  traMADol (ULTRAM) 50 MG tablet Take 1 tablet (50 mg total) by mouth every 6 (six) hours as needed. Patient not taking: Reported on 04/26/2017 11/19/16   Veryl Speak, MD    Allergies    Amoxicillin, Morphine and related, Percocet [oxycodone-acetaminophen], Percodan [oxycodone-aspirin], Acetaminophen, Cymbalta [duloxetine hcl], Gabapentin, Lexapro [escitalopram], Penicillins, Percodan [oxycodone-aspirin], Torecan [thiethylperazine], and Sulfa antibiotics  Review of Systems   Review of Systems  Neurological: Positive for weakness.   All other systems reviewed and are  negative except that which was mentioned in HPI  Physical Exam Updated Vital Signs BP (!) 165/71 (BP Location: Left Arm)   Pulse 100   Temp 98.8 F (37.1 C) (Oral)   Resp 16   SpO2 98%   Physical Exam Vitals and nursing note reviewed.  Constitutional:      General: She is not in acute distress.    Appearance: She is well-developed.     Comments: Thin, chronically ill appearing  HENT:     Head: Normocephalic and atraumatic.     Mouth/Throat:     Mouth: Mucous membranes are moist.     Comments: Edentulous Eyes:     Conjunctiva/sclera: Conjunctivae normal.     Pupils: Pupils are equal, round, and reactive to light.  Cardiovascular:     Rate and Rhythm: Normal rate and regular rhythm.     Heart sounds: Normal heart sounds. No murmur heard.   Pulmonary:     Effort: Pulmonary effort is normal.     Breath sounds: Normal breath sounds.  Abdominal:     General: Bowel sounds are normal. There is no distension.     Palpations: Abdomen is soft.     Tenderness: There is no abdominal tenderness.  Musculoskeletal:     Cervical back: Neck supple.  Skin:    General: Skin  is warm and dry.  Neurological:     Mental Status: She is alert and oriented to person, place, and time.     Comments: Dysarthria, chronic facial droop, right arm and right leg weakness with right arm contracture  Psychiatric:        Judgment: Judgment normal.     ED Results / Procedures / Treatments   Labs (all labs ordered are listed, but only abnormal results are displayed) Labs Reviewed  BASIC METABOLIC PANEL - Abnormal; Notable for the following components:      Result Value   Glucose, Bld 174 (*)    All other components within normal limits  URINALYSIS, ROUTINE W REFLEX MICROSCOPIC - Abnormal; Notable for the following components:   Color, Urine STRAW (*)    Glucose, UA >=500 (*)    All other components within normal limits  HEPATIC FUNCTION PANEL - Abnormal; Notable for the following components:   Total Protein 8.5 (*)    Alkaline Phosphatase 134 (*)    Indirect Bilirubin 0.2 (*)    All other components within normal limits  URINALYSIS, MICROSCOPIC (REFLEX) - Abnormal; Notable for the following components:   Bacteria, UA RARE (*)    All other components within normal limits  CBG MONITORING, ED - Abnormal; Notable for the following components:   Glucose-Capillary 176 (*)    All other components within normal limits  CBC    EKG EKG Interpretation  Date/Time:  Thursday October 16 2019 15:09:52 EDT Ventricular Rate:  99 PR Interval:  188 QRS Duration: 92 QT Interval:  370 QTC Calculation: 474 R Axis:   11 Text Interpretation: Normal sinus rhythm Biatrial enlargement Minimal voltage criteria for LVH, may be normal variant ( R in aVL ) Anterior infarct , age undetermined ST & T wave abnormality, consider lateral ischemia Abnormal ECG No significant change since last tracing Confirmed by Theotis Burrow [phone]) on 10/16/2019 4:16:13 PM   Radiology DG Chest Portable 1 View  Result Date: 10/16/2019 CLINICAL DATA:  Generalized weakness EXAM: PORTABLE CHEST 1 VIEW COMPARISON:   01/23/2018 FINDINGS: The heart size and mediastinal contours are within normal limits. Both lungs are clear. Inferior  displacement of the right humeral head as before. IMPRESSION: No active disease. Electronically Signed   By: Donavan Foil M.D.   On: 10/16/2019 16:43    Procedures Procedures (including critical care time)  Medications Ordered in ED Medications  sodium chloride flush (NS) 0.9 % injection 3 mL (3 mLs Intravenous Not Given 10/16/19 1609)    ED Course  I have reviewed the triage vital signs and the nursing notes.  Pertinent labs & imaging results that were available during my care of the patient were reviewed by me and considered in my medical decision making (see chart for details).    MDM Rules/Calculators/A&P                          Alert, oriented on exam, baseline neuro exam. VSS.  No abdominal tenderness.  EKG similar to previous.  Lab work shows no evidence of UTI, reassuring BMP with glucose 174 but no evidence of DKA, normal creatinine, normal LFTs, normal CBC with no anemia.  Chest x-ray clear with no evidence of infection.  Patient initially requested fentanyl for her chronic right arm pain but I declined stating that I do not provide narcotics for chronic pain conditions.  I discussed work-up findings with the patient and caregiver and discussed need for close PCP follow-up for further evaluation of generalized weakness.  I explained that other issues such as vitamin deficiencies and thyroid studies can be evaluated through PCP clinic.  Patient requested IV fluids but I explained that she has no evidence of dehydration and has had no vomiting or intolerance of p.o. therefore I recommended continuing to drink fluids and offered drink here but she declined.  I have extensively reviewed return precautions including sudden neurologic symptom, abdominal pain, vomiting, fever, confusion, or chest pain/shortness of breath.  They voiced understanding. Final Clinical  Impression(s) / ED Diagnoses Final diagnoses:  Generalized weakness    Rx / DC Orders ED Discharge Orders    None       Laasia Arcos, Wenda Overland, MD 10/16/19 1739

## 2019-10-16 NOTE — Discharge Instructions (Signed)
Please follow-up closely with your primary care provider to have further evaluation for your generalized weakness.  Return to the ER if you have any sudden unilateral weakness, abdominal pain, vomiting, severe hyperglycemia, confusion, or fever.

## 2019-10-16 NOTE — ED Triage Notes (Signed)
Pt c/o weakness "feel dehydrated" x 2 days-to triage in w/c-denies acute pain-caretaker with pt

## 2019-10-16 NOTE — ED Notes (Signed)
ED Provider at bedside. 

## 2019-10-27 ENCOUNTER — Telehealth: Payer: Self-pay

## 2019-10-27 NOTE — Telephone Encounter (Signed)
09/17/19: Palliative volunteer check in call attempt, no answer 

## 2019-11-05 ENCOUNTER — Other Ambulatory Visit: Payer: Medicare Other | Admitting: Internal Medicine

## 2019-11-05 DIAGNOSIS — F418 Other specified anxiety disorders: Secondary | ICD-10-CM

## 2019-11-05 DIAGNOSIS — Z515 Encounter for palliative care: Secondary | ICD-10-CM

## 2019-11-05 NOTE — Progress Notes (Signed)
[facility] Consult Note Telephone: [phone]  Fax: [phone]  PATIENT NAME: Laura Oconnor DOB: 06-Mar-1956 MRN: [id number]  PRIMARY CARE PROVIDER:   Clovia Cuff, MD  REFERRING PROVIDER:  Clovia Cuff, MD [address] [place],  [place] [address]  RESPONSIBLE PARTY:   Self and son   RECOMMENDATIONS and PLAN:  Palliative care encounter  Z51.5  1.  Advance care planning:  Reviewed MOST form.  Delegations made for DNAR/DNI, limited additional interventions, antibiotics and IV fluids if indicated, no tube feedings.  Document signed and left with patient for display.  Her goal is to avoid hospitalization and maintain quality of life.    2.  Anxiety:  At baseline.  Diversional activities.  Support given  I spent 30 minutes providing this consultation,  from [place] to 0915. More than 50% of the time in this consultation was spent coordinating communication with patient and son.   HISTORY OF PRESENT ILLNESS:  Follow-up with  [name].  She reports feeling tired without specific illness.  She also desires to change her code status today to DNAR/DNI.  "I don't want to be on any machines".  She continues to receive assistance with her ADLs and meals from her son. She is mobile via wheelchair. Palliative Care was asked to help address goals of care.   CODE STATUS: DNAR/DNI  PPS: 40% HOSPICE ELIGIBILITY/DIAGNOSIS: TBD  PAST MEDICAL HISTORY:  Past Medical History:  Diagnosis Date   Chronic arm pain    Chronic pain syndrome    Decubital ulcer    Diabetes mellitus    Heart attack ([place])    History of right coronary artery stent placement    2 stents placed at separate times   Hypertension    Stroke [facility])    Thyroid disease     SOCIAL HX: Lives with son  ALLERGIES:  Allergies  Allergen Reactions   Amoxicillin Nausea And Vomiting   Morphine And Related Nausea And Vomiting and Other (See Comments)    Also passed out    Percocet [Oxycodone-Acetaminophen] Nausea And Vomiting   Percodan [Oxycodone-Aspirin] Nausea And Vomiting   Acetaminophen Swelling    Tongue swells   Cymbalta [Duloxetine Hcl] Other (See Comments)    Tremors/uncontrolled movement per son   Gabapentin Other (See Comments)    Tremors/uncontrolled movements per son   Lexapro [Escitalopram] Other (See Comments)   Penicillins Nausea And Vomiting and Swelling    Tongue swelling Has patient had a PCN reaction causing immediate rash, facial/tongue/throat swelling, SOB or lightheadedness with hypotension: Yes Has patient had a PCN reaction causing severe rash involving mucus membranes or skin necrosis: Unknown Has patient had a PCN reaction that required hospitalization: Unknown Has patient had a PCN reaction occurring within the last 10 years: Unknown If all of the above answers are "NO", then may proceed with Cephalosporin use.   Percodan [Oxycodone-Aspirin] Nausea And Vomiting   Torecan [Thiethylperazine] Other (See Comments)    Pt does not recall this medication   Sulfa Antibiotics Nausea And Vomiting and Rash     PERTINENT MEDICATIONS:  Outpatient Encounter Medications as of 11/05/2019  Medication Sig   ACCU-CHEK AVIVA PLUS test strip USE TO TEST BLOOD SUGAR UP TO QID   ACCU-CHEK SOFTCLIX LANCETS lancets TEST UP TO QID   aspirin EC 81 MG tablet Take 81 mg by mouth daily.    atorvastatin (LIPITOR) 80 MG tablet Take 40 mg by mouth every morning.  Benzocaine (BOIL-EASE EX) Apply 1 application topically daily as needed (pain from boils).   benztropine (COGENTIN) 1 MG tablet Take 1 tablet (1 mg total) by mouth 2 (two) times daily. (Patient not taking: Reported on 04/26/2017)   clopidogrel (PLAVIX) 75 MG tablet Take 75 mg by mouth daily.   Colloidal Oatmeal (ECZEMA MOISTURIZING EX) Apply 1 application topically daily as needed (for irritation on buttocks).   cyclobenzaprine (FLEXERIL) 5 MG tablet Take 1 tablet (5 mg total)  by mouth 3 (three) times daily as needed for muscle spasms.   docusate sodium (COLACE) 100 MG capsule Take 100 mg by mouth every morning.   ferrous sulfate 220 (44 Fe) MG/5ML solution Take 220 mg by mouth daily.   ferrous sulfate 325 (65 FE) MG tablet Take 1 tablet (325 mg total) by mouth 2 (two) times daily with a meal.   gabapentin (NEURONTIN) 100 MG capsule as needed. 50mg  ?   ibuprofen (ADVIL,MOTRIN) 200 MG tablet Take 100 mg by mouth every 6 (six) hours as needed for headache (pain).   insulin glargine (LANTUS) 100 UNIT/ML injection Inject 20-30 Units into the skin See admin instructions. 20-30 units two times a day after meals (lunch and dinner)   Insulin Syringe-Needle U-100 31G X 1/4" 1 ML MISC 1 Dose by Misc.(Non-Drug; Combo Route) route daily. Or as directed to inject insulin under the skin   lamoTRIgine (LAMICTAL) 25 MG tablet Take 1/2 tablet in AM/1 tablet at bedtime   levothyroxine (SYNTHROID, LEVOTHROID) 137 MCG tablet Take 137 mcg by mouth daily before breakfast.   LORazepam (ATIVAN) 0.5 MG tablet Take 0.5 mg by mouth 4 (four) times daily as needed for anxiety.    OnabotulinumtoxinA (BOTOX IJ) every 3 (three) months. RIGHT ARM   ondansetron (ZOFRAN-ODT) 4 MG disintegrating tablet Take 4 mg by mouth every 6 (six) hours as needed for nausea or vomiting.    phenazopyridine (PYRIDIUM) 200 MG tablet Take 1 tablet (200 mg total) by mouth 3 (three) times daily.   polyethylene glycol (MIRALAX) packet Take 17 g by mouth daily.   psyllium (METAMUCIL) 0.52 g capsule Take 1 capsule (0.52 g total) by mouth daily.   senna-docusate (DOK PLUS) 8.6-50 MG tablet Take 1 tablet by mouth daily as needed for mild constipation.   Spacer/Aero-Holding Chambers (AEROCHAMBER PLUS WITH MASK) inhaler Use as instructed   traMADol (ULTRAM) 50 MG tablet Take 1 tablet (50 mg total) by mouth every 6 (six) hours as needed. (Patient not taking: Reported on 04/26/2017)   No facility-administered  encounter medications on file as of 11/05/2019.    PHYSICAL EXAM:   General: NAD, frail appearing, thin elderly female reclined in bed Cardiovascular: regular rate and rhythm Pulmonary: clear ant fields Abdomen: soft, nontender, + bowel sounds Extremities: no edema, stiffness and enlargement of R knee, flacid RUE and RLE Skin: exposed skin is intact Neurological: Alert and oriented. alerted speech, R  Psych:  Slightly anxious and cooperative  Gonzella Lex, NP-C

## 2019-11-06 ENCOUNTER — Other Ambulatory Visit: Payer: Self-pay

## 2020-02-04 ENCOUNTER — Other Ambulatory Visit: Payer: Self-pay

## 2020-02-04 DIAGNOSIS — N644 Mastodynia: Secondary | ICD-10-CM

## 2020-10-05 ENCOUNTER — Telehealth: Payer: Self-pay

## 2020-10-05 NOTE — Telephone Encounter (Signed)
Palliative care volunteer call made to check in on patient. No answer 

## 2021-05-30 ENCOUNTER — Telehealth: Payer: Self-pay

## 2021-05-30 DIAGNOSIS — Z515 Encounter for palliative care: Secondary | ICD-10-CM

## 2021-05-30 NOTE — Telephone Encounter (Signed)
(  3:25 pm) PC SW completed a check-in call with patient to assess needs, comfort and desire to continue with palliative care program. Patient advised that she did not want palliative care services at this time. She is moving and will contact the team in the future if needed.  No other concerns noted.

## 2021-10-25 ENCOUNTER — Telehealth: Payer: Self-pay

## 2021-10-25 NOTE — Telephone Encounter (Signed)
Attempted to contact patient to schedule a Palliative Care consult appointment. No answer left a message to return call.  

## 2021-11-01 ENCOUNTER — Telehealth: Payer: Self-pay

## 2021-11-01 NOTE — Telephone Encounter (Signed)
Spoke with patient and scheduled a telephonic Palliative Consult for 11/07/21 @ 2 PM.   Consent obtained; updated [facility], Team List and Epic.

## 2021-11-07 ENCOUNTER — Encounter: Payer: Medicare Other | Admitting: Internal Medicine

## 2021-11-07 NOTE — Progress Notes (Deleted)
[facility] Consult Note Telephone: [phone]  Fax: [phone]   Date of encounter: 11/07/21 9:44 AM PATIENT NAME: Laura Oconnor [place] [id number]   [phone] (home)  DOB: 11-Apr-1956 MRN: [id number] PRIMARY CARE PROVIDER:    Clovia Cuff, MD,  [address] [place] [address] [phone]  REFERRING PROVIDER:   Clovia Cuff, MD [address] [place],  [place] [address] [phone]  RESPONSIBLE PARTY:    Contact Information     Name Relation Home Work Mobile   Laura, Oconnor   [phone]   Laura Oconnor [phone]          Due to the COVID-19 crisis, this visit was done via telemedicine from my office and it was initiated and consent by this patient and or family.  I connected with  Laura Oconnor OR PROXY on 11/07/21 by a telemedicine application and verified that I am speaking with the correct person using two identifiers.  This patient did not have the ability to connect via a video-enabled capacity.   I discussed the limitations of evaluation and management by telemedicine. The patient expressed understanding and agreed to proceed.  Palliative Care was asked to follow this patient by consultation request of  Laura Cuff, MD to address advance care planning and complex medical decision making. This is the initial visit.                                     ASSESSMENT AND PLAN / RECOMMENDATIONS:   Advance Care Planning/Goals of Care: Goals include to maximize quality of life and symptom management. Patient/health care surrogate gave his/her permission to discuss.Our advance care planning conversation included a discussion about:    The value and importance of advance care planning  Experiences with loved ones who have been seriously ill or have died  Exploration of personal, cultural or spiritual beliefs that might influence medical decisions  Exploration of goals of  care in the event of a sudden injury or illness  Identification  of a healthcare agent  Review and updating or creation of an  advance directive document . Decision not to resuscitate or to de-escalate disease focused treatments due to poor prognosis. CODE STATUS:  Symptom Management/Plan:    Follow up Palliative Care Visit: Palliative care will continue to follow for complex medical decision making, advance care planning, and clarification of goals. Return ***  I spent *** minutes providing this consultation. More than 50% of the time in this consultation was spent in counseling and care coordination.  This visit was coded based on medical decision making (MDM).***  PPS: ***0%  HOSPICE ELIGIBILITY/DIAGNOSIS: ***/***  Chief Complaint: Initial palliative telephone consult  HISTORY OF PRESENT ILLNESS:  Laura Oconnor is a 66 y.o. year old female  with *** .   History obtained from review of EMR, discussion with primary team, and interview with family, facility staff/caregiver and/or Laura Oconnor.   I reviewed available labs, medications, imaging, studies and related documents from the EMR.  Records reviewed and summarized above.   ROS  Review of Systems  Physical Exam: Current and past weights: There were no vitals filed for this visit. There is no height or weight on file to calculate BMI. Wt Readings from Last 500 Encounters:  04/13/19 117 lb (53.1 kg)  12/01/18 105 lb (47.6 kg)  10/24/18 105 lb (47.6 kg)  01/09/18 103 lb 9.9 oz (47 kg)  08/23/17 105 lb (47.6 kg)  07/26/17 108 lb (49 kg)  05/23/17 108 lb (49 kg)  04/26/17 107 lb (48.5 kg)  03/05/17 107 lb (48.5 kg)  02/07/17 107 lb (48.5 kg)  01/29/17 107 lb (48.5 kg)  01/10/17 107 lb (48.5 kg)  12/23/16 107 lb (48.5 kg)  11/25/16 107 lb (48.5 kg)  11/22/16 107 lb (48.5 kg)  11/19/16 107 lb (48.5 kg)  10/15/16 115 lb (52.2 kg)  09/25/16 115 lb (52.2 kg)  08/14/14 123 lb 9.6 oz (56.1 kg)  01/04/14 120 lb (54.4 kg)   12/31/12 140 lb (63.5 kg)  06/08/12 140 lb (63.5 kg)  11/13/11 140 lb (63.5 kg)  03/04/11 135 lb (61.2 kg)   Physical Exam  CURRENT PROBLEM LIST:  Patient Active Problem List   Diagnosis Date Noted   Palliative care encounter 12/05/2017   Weakness 12/05/2017   Acute on chronic anemia 04/26/2017   TIA (transient ischemic attack) 02/22/2017   Right hip pain 02/09/2017   Right shoulder pain 01/13/2017   History of CVA with residual deficit 10/16/2016   Weakness as late effect of cerebrovascular accident (CVA) 08/09/2016   Depression with anxiety 08/04/2016   Hemiplegia affecting right dominant side ([facility]) 05/31/2016   Hypokalemia 08/13/2014   Diarrhea 08/13/2014   Loss of weight 08/13/2014   Hypocalcemia 08/13/2014   Atrophic vaginitis 08/13/2014   Type 2 diabetes mellitus, uncontrolled 08/13/2014   Breast cancer ([facility]) 08/13/2014   Hypothyroidism 08/13/2014   CAD (coronary artery disease) 08/13/2014   PAST MEDICAL HISTORY:  Active Ambulatory Problems    Diagnosis Date Noted   Hypokalemia 08/13/2014   Diarrhea 08/13/2014   Loss of weight 08/13/2014   Hypocalcemia 08/13/2014   Atrophic vaginitis 08/13/2014   Type 2 diabetes mellitus, uncontrolled 08/13/2014   Breast cancer ([facility]) 08/13/2014   Hypothyroidism 08/13/2014   CAD (coronary artery disease) 08/13/2014   Hemiplegia affecting right dominant side ([facility]) 05/31/2016   History of CVA with residual deficit 10/16/2016   Right shoulder pain 01/13/2017   Weakness as late effect of cerebrovascular accident (CVA) 08/09/2016   Depression with anxiety 08/04/2016   Right hip pain 02/09/2017   TIA (transient ischemic attack) 02/22/2017   Acute on chronic anemia 04/26/2017   Palliative care encounter 12/05/2017   Weakness 12/05/2017   Resolved Ambulatory Problems    Diagnosis Date Noted   No Resolved Ambulatory Problems   Past Medical History:  Diagnosis Date   Chronic arm pain    Chronic pain syndrome    Decubital  ulcer    Diabetes mellitus    Heart attack ([facility])    History of right coronary artery stent placement    Hypertension    Stroke ([facility])    Thyroid disease    SOCIAL HX:  Social History   Tobacco Use   Smoking status: Every Day    Packs/day: 0.50    Types: Cigarettes   Smokeless tobacco: Never  Substance Use Topics   Alcohol use: No   FAMILY HX:  Family History  Problem Relation Age of Onset   Diabetes Father    Diabetes Sister    Diabetes Brother       ALLERGIES:  Allergies  Allergen Reactions   Amoxicillin Nausea And Vomiting   Morphine And Related Nausea And Vomiting and Other (See Comments)    Also passed out   Percocet [Oxycodone-Acetaminophen] Nausea And Vomiting   Percodan [Oxycodone-Aspirin] Nausea And Vomiting   Acetaminophen  Swelling    Tongue swells   Cymbalta [Duloxetine Hcl] Other (See Comments)    Tremors/uncontrolled movement per son   Gabapentin Other (See Comments)    Tremors/uncontrolled movements per son   Lexapro [Escitalopram] Other (See Comments)   Penicillins Nausea And Vomiting and Swelling    Tongue swelling Has patient had a PCN reaction causing immediate rash, facial/tongue/throat swelling, SOB or lightheadedness with hypotension: Yes Has patient had a PCN reaction causing severe rash involving mucus membranes or skin necrosis: Unknown Has patient had a PCN reaction that required hospitalization: Unknown Has patient had a PCN reaction occurring within the last 10 years: Unknown If all of the above answers are "NO", then may proceed with Cephalosporin use.   Percodan [Oxycodone-Aspirin] Nausea And Vomiting   Torecan [Thiethylperazine] Other (See Comments)    Pt does not recall this medication   Sulfa Antibiotics Nausea And Vomiting and Rash      PERTINENT MEDICATIONS:  Outpatient Encounter Medications as of 11/07/2021  Medication Sig   ACCU-CHEK AVIVA PLUS test strip USE TO TEST BLOOD SUGAR UP TO QID   ACCU-CHEK SOFTCLIX LANCETS lancets  TEST UP TO QID   aspirin EC 81 MG tablet Take 81 mg by mouth daily.    atorvastatin (LIPITOR) 80 MG tablet Take 40 mg by mouth every morning.    Benzocaine (BOIL-EASE EX) Apply 1 application topically daily as needed (pain from boils).   benztropine (COGENTIN) 1 MG tablet Take 1 tablet (1 mg total) by mouth 2 (two) times daily. (Patient not taking: Reported on 04/26/2017)   clopidogrel (PLAVIX) 75 MG tablet Take 75 mg by mouth daily.   Colloidal Oatmeal (ECZEMA MOISTURIZING EX) Apply 1 application topically daily as needed (for irritation on buttocks).   cyclobenzaprine (FLEXERIL) 5 MG tablet Take 1 tablet (5 mg total) by mouth 3 (three) times daily as needed for muscle spasms.   docusate sodium (COLACE) 100 MG capsule Take 100 mg by mouth every morning.   ferrous sulfate 220 (44 Fe) MG/5ML solution Take 220 mg by mouth daily.   ferrous sulfate 325 (65 FE) MG tablet Take 1 tablet (325 mg total) by mouth 2 (two) times daily with a meal.   gabapentin (NEURONTIN) 100 MG capsule as needed. '50mg'$  ?   ibuprofen (ADVIL,MOTRIN) 200 MG tablet Take 100 mg by mouth every 6 (six) hours as needed for headache (pain).   insulin glargine (LANTUS) 100 UNIT/ML injection Inject 20-30 Units into the skin See admin instructions. 20-30 units two times a day after meals (lunch and dinner)   Insulin Syringe-Needle U-100 31G X 1/4" 1 ML MISC 1 Dose by Misc.(Non-Drug; Combo Route) route daily. Or as directed to inject insulin under the skin   lamoTRIgine (LAMICTAL) 25 MG tablet Take 1/2 tablet in AM/1 tablet at bedtime   levothyroxine (SYNTHROID, LEVOTHROID) 137 MCG tablet Take 137 mcg by mouth daily before breakfast.   LORazepam (ATIVAN) 0.5 MG tablet Take 0.5 mg by mouth 4 (four) times daily as needed for anxiety.    OnabotulinumtoxinA (BOTOX IJ) every 3 (three) months. RIGHT ARM   ondansetron (ZOFRAN-ODT) 4 MG disintegrating tablet Take 4 mg by mouth every 6 (six) hours as needed for nausea or vomiting.     phenazopyridine (PYRIDIUM) 200 MG tablet Take 1 tablet (200 mg total) by mouth 3 (three) times daily.   polyethylene glycol (MIRALAX) packet Take 17 g by mouth daily.   psyllium (METAMUCIL) 0.52 g capsule Take 1 capsule (0.52 g total) by mouth daily.  senna-docusate (DOK PLUS) 8.6-50 MG tablet Take 1 tablet by mouth daily as needed for mild constipation.   Spacer/Aero-Holding Chambers (AEROCHAMBER PLUS WITH MASK) inhaler Use as instructed   traMADol (ULTRAM) 50 MG tablet Take 1 tablet (50 mg total) by mouth every 6 (six) hours as needed. (Patient not taking: Reported on 04/26/2017)   No facility-administered encounter medications on file as of 11/07/2021.    Thank you for the opportunity to participate in the care of Ms. Morga.  The palliative care team will continue to follow. Please call our office at [phone] if we can be of additional assistance.   Hollace Kinnier, DO  COVID-19 PATIENT SCREENING TOOL Asked and negative response unless otherwise noted:  Have you had symptoms of covid, tested positive or been in contact with someone with symptoms/positive test in the past 5-10 days?  NO

## 2021-11-14 NOTE — Progress Notes (Signed)
This encounter was created in error - please disregard.

## 2021-12-05 ENCOUNTER — Other Ambulatory Visit: Payer: Medicare Other | Admitting: Internal Medicine

## 2021-12-05 ENCOUNTER — Telehealth: Payer: Self-pay

## 2021-12-05 ENCOUNTER — Other Ambulatory Visit: Payer: Medicare Other

## 2021-12-05 DIAGNOSIS — R531 Weakness: Secondary | ICD-10-CM

## 2021-12-05 DIAGNOSIS — I5023 Acute on chronic systolic (congestive) heart failure: Secondary | ICD-10-CM

## 2021-12-05 DIAGNOSIS — I69998 Other sequelae following unspecified cerebrovascular disease: Secondary | ICD-10-CM

## 2021-12-05 DIAGNOSIS — Z515 Encounter for palliative care: Secondary | ICD-10-CM

## 2021-12-05 DIAGNOSIS — F339 Major depressive disorder, recurrent, unspecified: Secondary | ICD-10-CM

## 2021-12-05 DIAGNOSIS — I69322 Dysarthria following cerebral infarction: Secondary | ICD-10-CM

## 2021-12-05 NOTE — Telephone Encounter (Signed)
415 pm.  Request received from Dr. Mariea Clonts regarding visit need.  Phone call made to patient and visit is scheduled for tomorrow at 1230 pm.

## 2021-12-05 NOTE — Progress Notes (Unsigned)
[facility] Follow-Up Visit Telephone: [phone]  Fax: [phone]   Date of encounter: 12/05/21 9:04 AM PATIENT NAME: Laura Oconnor [address] [address]   [id number] (home)  DOB: Mar 29, 1956 MRN: [id number] PRIMARY CARE PROVIDER:    Clovia Cuff, MD,  [address] [place] [address] [phone]  REFERRING PROVIDER:   Clovia Cuff, MD [address] [place],  [place] [address] [phone]  RESPONSIBLE PARTY:    Contact Information     Name Relation Home Work Mobile   Yovanna, Cogan   [phone]   Vernie Ammons [phone]        Due to the COVID-19 crisis, this visit was done via telemedicine from my office and it was initiated and consent by this patient and or family.  I connected with  MADELYNN MALSON OR PROXY on 12/07/21 by a  telemedicine application and verified that I am speaking with the correct person using two identifiers.   I discussed the limitations of evaluation and management by telemedicine. The patient expressed understanding and agreed to proceed.  Pt did not have video capability.   Palliative Care was asked to follow this patient by consultation request of  Clovia Cuff, MD to address advance care planning and complex medical decision making. This is follow-up visit.                                     ASSESSMENT AND PLAN / RECOMMENDATIONS:   Advance Care Planning/Goals of Care: Goals include to maximize quality of life and symptom management. Patient/health care surrogate gave his/her permission to discuss.Our advance care planning conversation included a discussion about:    The value and importance of advance care planning  Experiences with loved ones who have been seriously ill or have died  Exploration of personal, cultural or spiritual beliefs that might influence medical decisions  Exploration of goals of care in the event of a sudden injury or  illness  Identification  of a healthcare agent  Review and updating or creation of an  advance directive document . Decision not to resuscitate or to de-escalate disease focused treatments due to poor prognosis. CODE STATUS:  sounds like she wants DNR, but this was not explicitly discussed. She wants to change what she wants out of life.  She has a lot of pain and wants help with that.  She had home health therapy before.  Touch therapy.   She was wanting to die at one time b/c pain was so bad.  She is depressed, hurting all the time.  She has hemiparesis so can't hurt herself.     Symptom Management/Plan: 1. Acute on chronic HFrEF (heart failure with reduced ejection fraction) ([place]) -suspect pt will need to continue lasix '40mg'$  as it sounds like she's gained weight since leaving hospital and symptomatic -asked nursing to go out and see her in person to assess and also due to depression  2. Weakness as late effect of cerebrovascular accident (CVA) -would like to have therapy again, but I could not order as this was a phone visit  3. 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 Depression, recurrent ([place]) -ongoing, sounds like pt is not taking meds as prescribed at this time which may explain recurrent symptoms  5.  Palliative care encounter -pt adamant about in-person visit need and this was made as a phone visit so will ask nurse to eval in person -will need to address DNR, MOST next time I see her if that's not addressed during nursing eval    Follow up Palliative Care Visit: Palliative care will continue to follow for complex medical decision making, advance care planning, and clarification of goals. Will ask if nurse can do visit with pt in person asap as she is requesting this.    I spent 31 minutes providing this consultation. More than 50% of the time in this consultation  was spent in counseling and care coordination.   PPS: 40%  HOSPICE ELIGIBILITY/DIAGNOSIS: TBD  Chief Complaint: Follow-up palliative visit  HISTORY OF PRESENT ILLNESS:  Laura Oconnor is a 66 y.o. year old female  with HFrEF, anxiety, depression, panic attacks, DMII, h/o embolic stroke with right hemiparesis and significant expressive aphasia, ongoing tobacco abuse seen in palliative follow-up via phone visit.  She was seen by Gonzella Lex, NP 2 years ago and discussion completed that showed DNR, DNI, avoid hospitalizations.  PPS was 40% then.  She has had CAD s/p PCI in 2018.  She's had 5 ED visits and 3 admissions since May of this year.  5/17 hyperglycemia ED, 6/12 admitted with acute respiratory distress with sob, NSTEMI, hypertensive emergency, 6/24 SOB to ED, 7/8 volume overload and SOB to ED, 7/10 admitted with NSTEMI/chest pain, 7/24 admitted with sob and elevated troponin, 7/27 ED with sob, 8/13 ED with HFrEF.    Last weight was 115 lbs then with BMI 21.03, alb 3.6, cr 0.92, BUN 1.  Was given lasix '40mg'$  daily for a week in place of her usual '20mg'$ .    She is not feeling too well.  Arm, knee, hand hurts.   She is short of breath.  She has a little bit of swelling in her knee and foot--right one.  It is worse than it was.    She has an in-home aide here most of the time.   She had been taking furosemide which makes her pee a lot.   Dr. Altamese Dilling office to come see her Wednesday. She weighed 128 lbs at home now.  Hospital said 115 lbs.  She is sob.  Needs visit in home  History obtained from review of EMR, discussion with primary team, and interview with family, facility staff/caregiver and/or Ms. Mccarn.  I reviewed available labs, medications, imaging, studies and related documents from the EMR.  Records reviewed and summarized above.   ROS Review of Systems  Constitutional:  Positive for activity change and unexpected weight change. Negative for chills and fever.  Respiratory:   Positive for shortness of breath. Negative for chest tightness.   Cardiovascular:  Positive for leg swelling.  Gastrointestinal:  Negative for constipation.  Genitourinary:  Negative for dysuria.  Musculoskeletal:  Positive for gait problem and joint swelling.  Neurological:  Positive for speech difficulty and weakness.  Psychiatric/Behavioral:  Positive for dysphoric mood. The patient is nervous/anxious.     Physical Exam: 115 lbs at hospital d/c and now reports 128 lbs Wt Readings from Last 500 Encounters:  04/13/19 117 lb (53.1 kg)  12/01/18 105 lb (47.6 kg)  10/24/18 105 lb (47.6 kg)  01/09/18 103 lb 9.9 oz (47 kg)  08/23/17 105 lb (47.6 kg)  07/26/17 108 lb (49 kg)  05/23/17 108 lb (49 kg)  04/26/17 107 lb (48.5 kg)  03/05/17 107 lb (48.5 kg)  02/07/17 107 lb (48.5 kg)  01/29/17 107 lb (48.5 kg)  01/10/17 107 lb (48.5 kg)  12/23/16 107 lb (48.5 kg)  11/25/16 107 lb (48.5 kg)  11/22/16 107 lb (48.5 kg)  11/19/16 107 lb (48.5 kg)  10/15/16 115 lb (52.2 kg)  09/25/16 115 lb (52.2 kg)  08/14/14 123 lb 9.6 oz (56.1 kg)  01/04/14 120 lb (54.4 kg)  12/31/12 140 lb (63.5 kg)  06/08/12 140 lb (63.5 kg)  11/13/11 140 lb (63.5 kg)  03/04/11 135 lb (61.2 kg)     CURRENT PROBLEM LIST:  Patient Active Problem List   Diagnosis Date Noted   Palliative care encounter 12/05/2017   Weakness 12/05/2017   Acute on chronic anemia 04/26/2017   TIA (transient ischemic attack) 02/22/2017   Right hip pain 02/09/2017   Right shoulder pain 01/13/2017   History of CVA with residual deficit 10/16/2016   Weakness as late effect of cerebrovascular accident (CVA) 08/09/2016   Depression with anxiety 08/04/2016   Hemiplegia affecting right dominant side ([place]) 05/31/2016   Hypokalemia 08/13/2014   Diarrhea 08/13/2014   Loss of weight 08/13/2014   Hypocalcemia 08/13/2014   Atrophic vaginitis 08/13/2014   Type 2 diabetes mellitus, uncontrolled 08/13/2014   Breast cancer ([place]) 08/13/2014    Hypothyroidism 08/13/2014   CAD (coronary artery disease) 08/13/2014    PAST MEDICAL HISTORY:  Active Ambulatory Problems    Diagnosis Date Noted   Hypokalemia 08/13/2014   Diarrhea 08/13/2014   Loss of weight 08/13/2014   Hypocalcemia 08/13/2014   Atrophic vaginitis 08/13/2014   Type 2 diabetes mellitus, uncontrolled 08/13/2014   Breast cancer ([place]) 08/13/2014   Hypothyroidism 08/13/2014   CAD (coronary artery disease) 08/13/2014   Hemiplegia affecting right dominant side ([place]) 05/31/2016   History of CVA with residual deficit 10/16/2016   Right shoulder pain 01/13/2017   Weakness as late effect of cerebrovascular accident (CVA) 08/09/2016   Depression with anxiety 08/04/2016   Right hip pain 02/09/2017   TIA (transient ischemic attack) 02/22/2017   Acute on chronic anemia 04/26/2017   Palliative care encounter 12/05/2017   Weakness 12/05/2017   Resolved Ambulatory Problems    Diagnosis Date Noted   No Resolved Ambulatory Problems   Past Medical History:  Diagnosis Date   Chronic arm pain    Chronic pain syndrome    Decubital ulcer    Diabetes mellitus    Heart attack ([place])    History of right coronary artery stent placement    Hypertension    Stroke ([place])    Thyroid disease     SOCIAL HX:  Social History   Tobacco Use   Smoking status: Every Day    Packs/day: 0.50    Types: Cigarettes   Smokeless tobacco: Never  Substance Use Topics   Alcohol use: No     ALLERGIES:  Allergies  Allergen Reactions   Amoxicillin Nausea And Vomiting   Morphine And Related Nausea And Vomiting and Other (See Comments)    Also passed out   Percocet [Oxycodone-Acetaminophen] Nausea And Vomiting   Percodan [Oxycodone-Aspirin] Nausea And Vomiting   Acetaminophen Swelling    Tongue swells   Cymbalta [Duloxetine Hcl] Other (See Comments)    Tremors/uncontrolled movement per son   Gabapentin Other (See Comments)    Tremors/uncontrolled movements per son   Lexapro  [Escitalopram] Other (See Comments)   Penicillins Nausea And Vomiting and Swelling    Tongue swelling Has patient had a PCN reaction causing immediate rash, facial/tongue/throat swelling, SOB  or lightheadedness with hypotension: Yes Has patient had a PCN reaction causing severe rash involving mucus membranes or skin necrosis: Unknown Has patient had a PCN reaction that required hospitalization: Unknown Has patient had a PCN reaction occurring within the last 10 years: Unknown If all of the above answers are "NO", then may proceed with Cephalosporin use.   Percodan [Oxycodone-Aspirin] Nausea And Vomiting   Torecan [Thiethylperazine] Other (See Comments)    Pt does not recall this medication   Sulfa Antibiotics Nausea And Vomiting and Rash      PERTINENT MEDICATIONS:  Outpatient Encounter Medications as of 12/05/2021  Medication Sig   ACCU-CHEK AVIVA PLUS test strip USE TO TEST BLOOD SUGAR UP TO QID   ACCU-CHEK SOFTCLIX LANCETS lancets TEST UP TO QID   aspirin EC 81 MG tablet Take 81 mg by mouth daily.    atorvastatin (LIPITOR) 80 MG tablet Take 40 mg by mouth every morning.    Benzocaine (BOIL-EASE EX) Apply 1 application topically daily as needed (pain from boils).   benztropine (COGENTIN) 1 MG tablet Take 1 tablet (1 mg total) by mouth 2 (two) times daily. (Patient not taking: Reported on 04/26/2017)   clopidogrel (PLAVIX) 75 MG tablet Take 75 mg by mouth daily.   Colloidal Oatmeal (ECZEMA MOISTURIZING EX) Apply 1 application topically daily as needed (for irritation on buttocks).   cyclobenzaprine (FLEXERIL) 5 MG tablet Take 1 tablet (5 mg total) by mouth 3 (three) times daily as needed for muscle spasms.   docusate sodium (COLACE) 100 MG capsule Take 100 mg by mouth every morning.   ferrous sulfate 220 (44 Fe) MG/5ML solution Take 220 mg by mouth daily.   ferrous sulfate 325 (65 FE) MG tablet Take 1 tablet (325 mg total) by mouth 2 (two) times daily with a meal.   gabapentin (NEURONTIN)  100 MG capsule as needed. '50mg'$  ?   ibuprofen (ADVIL,MOTRIN) 200 MG tablet Take 100 mg by mouth every 6 (six) hours as needed for headache (pain).   insulin glargine (LANTUS) 100 UNIT/ML injection Inject 20-30 Units into the skin See admin instructions. 20-30 units two times a day after meals (lunch and dinner)   Insulin Syringe-Needle U-100 31G X 1/4" 1 ML MISC 1 Dose by Misc.(Non-Drug; Combo Route) route daily. Or as directed to inject insulin under the skin   lamoTRIgine (LAMICTAL) 25 MG tablet Take 1/2 tablet in AM/1 tablet at bedtime   levothyroxine (SYNTHROID, LEVOTHROID) 137 MCG tablet Take 137 mcg by mouth daily before breakfast.   LORazepam (ATIVAN) 0.5 MG tablet Take 0.5 mg by mouth 4 (four) times daily as needed for anxiety.    OnabotulinumtoxinA (BOTOX IJ) every 3 (three) months. RIGHT ARM   ondansetron (ZOFRAN-ODT) 4 MG disintegrating tablet Take 4 mg by mouth every 6 (six) hours as needed for nausea or vomiting.    phenazopyridine (PYRIDIUM) 200 MG tablet Take 1 tablet (200 mg total) by mouth 3 (three) times daily.   polyethylene glycol (MIRALAX) packet Take 17 g by mouth daily.   psyllium (METAMUCIL) 0.52 g capsule Take 1 capsule (0.52 g total) by mouth daily.   senna-docusate (DOK PLUS) 8.6-50 MG tablet Take 1 tablet by mouth daily as needed for mild constipation.   Spacer/Aero-Holding Chambers (AEROCHAMBER PLUS WITH MASK) inhaler Use as instructed   traMADol (ULTRAM) 50 MG tablet Take 1 tablet (50 mg total) by mouth every 6 (six) hours as needed. (Patient not taking: Reported on 04/26/2017)   No facility-administered encounter medications on file as  of 12/05/2021.    Thank you for the opportunity to participate in the care of Ms. Palermo.  The palliative care team will continue to follow. Please call our office at [phone] if we can be of additional assistance.   Hollace Kinnier, DO  COVID-19 PATIENT SCREENING TOOL Asked and negative response unless otherwise noted:  Have you  had symptoms of covid, tested positive or been in contact with someone with symptoms/positive test in the past 5-10 days? No

## 2021-12-06 ENCOUNTER — Other Ambulatory Visit: Payer: Medicare Other

## 2021-12-06 ENCOUNTER — Telehealth: Payer: Self-pay

## 2021-12-06 VITALS — BP 122/84 | Temp 98.1°F

## 2021-12-06 DIAGNOSIS — Z515 Encounter for palliative care: Secondary | ICD-10-CM

## 2021-12-06 NOTE — Progress Notes (Signed)
PATIENT NAME: Laura Oconnor DOB: 29-Jul-1955 MRN: [id number]  PRIMARY CARE PROVIDER: Clovia Cuff, MD  RESPONSIBLE PARTY:  Acct ID - Guarantor Home Phone Work Phone Relationship Acct Type  [id number] [name], [name] [phone]  Self P/F     [address], [place], [place] [address]   Anxiety:  Patient endorses anxiety.  She is taking lorazepam and states this is effective.  Patient also likes to go out for a ride when her anxiety level is high.  She especially enjoys [employer] shopping that is a helpful distraction.   Appetite:  Patient endorses eating well.  She has a private duty aide [name] that is in the home from almost daily.  Patient eats snacks   Diabetes:  Blood sugars are checked about 1 x weekly.  Patient endorses readings in the 250's.  Constipation:  Patient is taking a stool softner but requires and enema 1 x weekly since her stroke.   She reports having a bowel movement 1 x weekly with the enema.   Home Health:  Patient advises she had touch therapy a couple of years ago which used essential oils.  She is asking if this is still available.  Patient would like to have [facility] services with PT/OT for strengthening.  I will follow up with Dr. Mariea Clonts regarding a referral.  Patient would like [facility] if possible.   Shortness of breath:  Patient endorses dyspnea yesterday requiring 2 breathing treatments.  This was effective and patient has not required this today.   Medication Management:  Son is managing medications and using a pill box.  Pain:  Patient currently has [place] Menthol cream that she uses on bilateral knees, right arm and hip.   She is almost out but has not been able to locate this.  We checked on [place] but this is out of stock and uncertain when it will be re-stocked.   Safety:  Patient has a life alert device in the home.    Patient currently has CAP C and has a case worker who is trying to obtain Ensure supplements for her.      HISTORY OF  PRESENT ILLNESS:    CODE STATUS: Full-patient is undecided ADVANCED DIRECTIVES: No MOST FORM: Yes-previously did this about 2 years ago but patient would like to revisit this and possibly make changes.  PPS: 30%   PHYSICAL EXAM:   VITALS: Today's Vitals   12/06/21 1305  BP: 122/84  Temp: 98.1 F (36.7 C)  SpO2: 94%          Lorenza Burton, RN

## 2021-12-06 NOTE — Telephone Encounter (Signed)
PC SW placed TC to [employer] [facility] to complete new referral or patient.   SW LVM with patient info and SW contact.

## 2021-12-07 ENCOUNTER — Encounter: Payer: Self-pay | Admitting: Internal Medicine

## 2021-12-20 ENCOUNTER — Telehealth: Payer: Self-pay

## 2021-12-20 NOTE — Telephone Encounter (Signed)
Incoming call: SW received TC from [place], with senior resources of [facility] in regards to [employer] referral VM. Ubaldo Glassing took patient information and shared that patient would be added to [employer].

## 2021-12-29 ENCOUNTER — Telehealth: Payer: Self-pay

## 2021-12-29 NOTE — Telephone Encounter (Signed)
PC SW connected with with patients son, [name] in reference to [facility] referral for patient. As SW received a call from Luvenia Heller with [facility] stating that they have been unable to reach patient.  Son shared that he was sitting with patient at this time and patient voiced that she is not interested in the [facility] service.
